# Patient Record
Sex: Female | Born: 1960
Health system: Southern US, Community
[De-identification: ages and names within clinical notes are randomized; demographics above are authoritative.]

## PROBLEM LIST (undated history)

## (undated) DIAGNOSIS — D649 Anemia, unspecified: Secondary | ICD-10-CM

## (undated) DIAGNOSIS — F419 Anxiety disorder, unspecified: Secondary | ICD-10-CM

## (undated) DIAGNOSIS — F4310 Post-traumatic stress disorder, unspecified: Secondary | ICD-10-CM

## (undated) DIAGNOSIS — Z86010 Personal history of colonic polyps: Secondary | ICD-10-CM

## (undated) DIAGNOSIS — K589 Irritable bowel syndrome without diarrhea: Secondary | ICD-10-CM

## (undated) DIAGNOSIS — N289 Disorder of kidney and ureter, unspecified: Secondary | ICD-10-CM

## (undated) DIAGNOSIS — G2581 Restless legs syndrome: Secondary | ICD-10-CM

## (undated) DIAGNOSIS — E039 Hypothyroidism, unspecified: Secondary | ICD-10-CM

## (undated) DIAGNOSIS — B009 Herpesviral infection, unspecified: Secondary | ICD-10-CM

## (undated) DIAGNOSIS — N200 Calculus of kidney: Secondary | ICD-10-CM

## (undated) DIAGNOSIS — Z860101 Personal history of adenomatous and serrated colon polyps: Secondary | ICD-10-CM

## (undated) DIAGNOSIS — M8580 Other specified disorders of bone density and structure, unspecified site: Secondary | ICD-10-CM

## (undated) DIAGNOSIS — K5900 Constipation, unspecified: Secondary | ICD-10-CM

## (undated) DIAGNOSIS — F4312 Post-traumatic stress disorder, chronic: Secondary | ICD-10-CM

## (undated) DIAGNOSIS — F32A Depression, unspecified: Secondary | ICD-10-CM

## (undated) DIAGNOSIS — K635 Polyp of colon: Secondary | ICD-10-CM

## (undated) DIAGNOSIS — J45909 Unspecified asthma, uncomplicated: Secondary | ICD-10-CM

## (undated) DIAGNOSIS — E079 Disorder of thyroid, unspecified: Secondary | ICD-10-CM

## (undated) DIAGNOSIS — T7840XA Allergy, unspecified, initial encounter: Secondary | ICD-10-CM

## (undated) DIAGNOSIS — F329 Major depressive disorder, single episode, unspecified: Secondary | ICD-10-CM

## (undated) DIAGNOSIS — K219 Gastro-esophageal reflux disease without esophagitis: Secondary | ICD-10-CM

## (undated) DIAGNOSIS — E215 Disorder of parathyroid gland, unspecified: Secondary | ICD-10-CM

## (undated) DIAGNOSIS — F319 Bipolar disorder, unspecified: Secondary | ICD-10-CM

## (undated) DIAGNOSIS — E785 Hyperlipidemia, unspecified: Secondary | ICD-10-CM

## (undated) DIAGNOSIS — N809 Endometriosis, unspecified: Secondary | ICD-10-CM

## (undated) DIAGNOSIS — K759 Inflammatory liver disease, unspecified: Secondary | ICD-10-CM

## (undated) DIAGNOSIS — K649 Unspecified hemorrhoids: Secondary | ICD-10-CM

## (undated) HISTORY — DX: Disorder of parathyroid gland, unspecified: E21.5

## (undated) HISTORY — DX: Personal history of adenomatous and serrated colon polyps: Z86.0101

## (undated) HISTORY — DX: Inflammatory liver disease, unspecified: K75.9

## (undated) HISTORY — PX: POLYPECTOMY: SHX149

## (undated) HISTORY — DX: Personal history of colonic polyps: Z86.010

## (undated) HISTORY — DX: Herpesviral infection, unspecified: B00.9

## (undated) HISTORY — DX: Post-traumatic stress disorder, unspecified: F43.10

## (undated) HISTORY — DX: Constipation, unspecified: K59.00

## (undated) HISTORY — PX: APPENDECTOMY: SHX54

## (undated) HISTORY — DX: Post-traumatic stress disorder, chronic: F43.12

## (undated) HISTORY — PX: ECTOPIC PREGNANCY SURGERY: SHX613

## (undated) HISTORY — DX: Anxiety disorder, unspecified: F41.9

## (undated) HISTORY — DX: Other specified disorders of bone density and structure, unspecified site: M85.80

## (undated) HISTORY — DX: Unspecified asthma, uncomplicated: J45.909

## (undated) HISTORY — DX: Restless legs syndrome: G25.81

## (undated) HISTORY — DX: Gastro-esophageal reflux disease without esophagitis: K21.9

## (undated) HISTORY — DX: Disorder of kidney and ureter, unspecified: N28.9

## (undated) HISTORY — DX: Disorder of thyroid, unspecified: E07.9

## (undated) HISTORY — DX: Anemia, unspecified: D64.9

## (undated) HISTORY — DX: Hyperlipidemia, unspecified: E78.5

## (undated) HISTORY — DX: Unspecified hemorrhoids: K64.9

## (undated) HISTORY — DX: Calculus of kidney: N20.0

## (undated) HISTORY — DX: Endometriosis, unspecified: N80.9

## (undated) HISTORY — DX: Allergy, unspecified, initial encounter: T78.40XA

## (undated) HISTORY — DX: Polyp of colon: K63.5

## (undated) HISTORY — DX: Irritable bowel syndrome, unspecified: K58.9

---

## 1978-12-27 HISTORY — PX: OTHER SURGICAL HISTORY: SHX169

## 1998-10-07 ENCOUNTER — Other Ambulatory Visit: Admission: RE | Admit: 1998-10-07 | Discharge: 1998-10-07 | Payer: Self-pay | Admitting: Obstetrics and Gynecology

## 1999-11-25 ENCOUNTER — Other Ambulatory Visit: Admission: RE | Admit: 1999-11-25 | Discharge: 1999-11-25 | Payer: Self-pay | Admitting: Obstetrics and Gynecology

## 2000-12-01 ENCOUNTER — Other Ambulatory Visit: Admission: RE | Admit: 2000-12-01 | Discharge: 2000-12-01 | Payer: Self-pay | Admitting: Obstetrics and Gynecology

## 2001-12-25 ENCOUNTER — Encounter: Admission: RE | Admit: 2001-12-25 | Discharge: 2001-12-25 | Payer: Self-pay | Admitting: *Deleted

## 2001-12-25 ENCOUNTER — Encounter: Payer: Self-pay | Admitting: *Deleted

## 2002-01-12 ENCOUNTER — Other Ambulatory Visit: Admission: RE | Admit: 2002-01-12 | Discharge: 2002-01-12 | Payer: Self-pay | Admitting: Obstetrics and Gynecology

## 2002-02-08 ENCOUNTER — Encounter: Payer: Self-pay | Admitting: Internal Medicine

## 2002-02-08 ENCOUNTER — Ambulatory Visit (HOSPITAL_COMMUNITY): Admission: RE | Admit: 2002-02-08 | Discharge: 2002-02-08 | Payer: Self-pay | Admitting: Internal Medicine

## 2004-12-22 ENCOUNTER — Ambulatory Visit: Payer: Self-pay

## 2004-12-22 ENCOUNTER — Ambulatory Visit: Payer: Self-pay | Admitting: Internal Medicine

## 2006-01-06 ENCOUNTER — Other Ambulatory Visit: Admission: RE | Admit: 2006-01-06 | Discharge: 2006-01-06 | Payer: Self-pay | Admitting: Obstetrics and Gynecology

## 2006-02-18 ENCOUNTER — Ambulatory Visit: Payer: Self-pay | Admitting: Internal Medicine

## 2007-01-10 ENCOUNTER — Other Ambulatory Visit: Admission: RE | Admit: 2007-01-10 | Discharge: 2007-01-10 | Payer: Self-pay | Admitting: Obstetrics and Gynecology

## 2007-08-18 ENCOUNTER — Inpatient Hospital Stay (HOSPITAL_COMMUNITY): Admission: RE | Admit: 2007-08-18 | Discharge: 2007-08-21 | Payer: Self-pay | Admitting: Psychiatry

## 2007-08-18 ENCOUNTER — Ambulatory Visit: Payer: Self-pay | Admitting: *Deleted

## 2007-11-29 ENCOUNTER — Ambulatory Visit: Payer: Self-pay | Admitting: Internal Medicine

## 2008-01-12 ENCOUNTER — Other Ambulatory Visit: Admission: RE | Admit: 2008-01-12 | Discharge: 2008-01-12 | Payer: Self-pay | Admitting: Obstetrics and Gynecology

## 2008-01-13 DIAGNOSIS — K625 Hemorrhage of anus and rectum: Secondary | ICD-10-CM | POA: Insufficient documentation

## 2008-01-13 DIAGNOSIS — K648 Other hemorrhoids: Secondary | ICD-10-CM | POA: Insufficient documentation

## 2008-01-13 DIAGNOSIS — F319 Bipolar disorder, unspecified: Secondary | ICD-10-CM | POA: Insufficient documentation

## 2008-01-13 DIAGNOSIS — K5900 Constipation, unspecified: Secondary | ICD-10-CM | POA: Insufficient documentation

## 2008-01-13 DIAGNOSIS — K589 Irritable bowel syndrome without diarrhea: Secondary | ICD-10-CM | POA: Insufficient documentation

## 2009-02-24 ENCOUNTER — Encounter: Payer: Self-pay | Admitting: Obstetrics and Gynecology

## 2009-02-24 ENCOUNTER — Other Ambulatory Visit: Admission: RE | Admit: 2009-02-24 | Discharge: 2009-02-24 | Payer: Self-pay | Admitting: Obstetrics and Gynecology

## 2009-02-24 ENCOUNTER — Ambulatory Visit: Payer: Self-pay | Admitting: Obstetrics and Gynecology

## 2009-05-08 ENCOUNTER — Encounter: Admission: RE | Admit: 2009-05-08 | Discharge: 2009-05-08 | Payer: Self-pay | Admitting: Emergency Medicine

## 2010-03-12 ENCOUNTER — Other Ambulatory Visit: Admission: RE | Admit: 2010-03-12 | Discharge: 2010-03-12 | Payer: Self-pay | Admitting: Obstetrics and Gynecology

## 2010-03-12 ENCOUNTER — Ambulatory Visit: Payer: Self-pay | Admitting: Obstetrics and Gynecology

## 2010-05-04 ENCOUNTER — Ambulatory Visit: Payer: Self-pay | Admitting: Obstetrics and Gynecology

## 2010-05-21 ENCOUNTER — Ambulatory Visit: Payer: Self-pay | Admitting: Obstetrics and Gynecology

## 2010-07-29 ENCOUNTER — Encounter: Admission: RE | Admit: 2010-07-29 | Discharge: 2010-07-29 | Payer: Self-pay | Admitting: Emergency Medicine

## 2010-09-02 ENCOUNTER — Emergency Department (HOSPITAL_COMMUNITY): Admission: EM | Admit: 2010-09-02 | Discharge: 2010-09-02 | Payer: Self-pay | Admitting: Emergency Medicine

## 2011-01-18 ENCOUNTER — Encounter: Payer: Self-pay | Admitting: Emergency Medicine

## 2011-01-28 NOTE — Procedures (Signed)
Summary: Gastroenterology colon  Gastroenterology colon   Imported By: Lorre Munroe RN 01/15/2008 14:15:32  _____________________________________________________________________  External Attachment:    Type:   Image     Comment:   External Document

## 2011-03-11 LAB — URINALYSIS, ROUTINE W REFLEX MICROSCOPIC
Bilirubin Urine: NEGATIVE
Glucose, UA: NEGATIVE mg/dL
Ketones, ur: NEGATIVE mg/dL
Nitrite: NEGATIVE
Protein, ur: NEGATIVE mg/dL
Specific Gravity, Urine: 1.004 — ABNORMAL LOW (ref 1.005–1.030)
Urobilinogen, UA: 0.2 mg/dL (ref 0.0–1.0)
pH: 6.5 (ref 5.0–8.0)

## 2011-03-11 LAB — URINE MICROSCOPIC-ADD ON
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
## 2011-05-14 NOTE — Op Note (Signed)
[facility]  Patient:    Anne Little, Anne Little Visit Number: [id number] MRN: [id number]          Service Type: END Location: ENDO Attending Physician:  Mervin Hack Dictated by:   Hedwig Morton. Juanda Chance, M.D. [facility] Proc. Date: 02/08/02 Admit Date:  02/08/2002   CC:         Vanita Panda, M.D.,  [facility]   Operative Report  PROCEDURE:  Colonoscopy.  INDICATIONS FOR PROCEDURE:  This 50 year old white female with history of endometriosis has had recent rectal bleeding. It has been bright red on toilet tissue as well as in the commode, while her stools themselves have been formed and normal appearing. Her bowel habits have been regular. At the age of 55, the patient had an anal fissure which was treated. Her weight has been stable. She has been on Elavil 25 mg at bedtime. There is a remote family history of colon cancer in the maternal grand aunt. She is undergoing colonoscopy to assess her rectal bleeding.  ENDOSCOPE:  Olympus single channel videoscope.  SEDATION:  Versed 15 mg IV, Demerol 150 mg IV.  FINDINGS:  The Olympus single channel videoscope passed under direct vision to the rectosigmoid colon. The patient was monitored by pulse oximeter. Her oxygen saturations were normal. Her prep was excellent. She required a large amount of sedation. The perineum appeared normal. The patient has a history of anal herpes and complained of rectal irritation, but the perineal area itself appeared normal and rectal tone was normal as well. On retroflexion with the endoscope in the rectum, there were small internal hemorrhoids which did not appear to be thrombosed and did not bleed actively. The sigmoid colon was normal with some tortuosity but no diverticulosis. There were no bleeding lesions. It was difficult to travel the descending colon because of the redundancy and the patients small size. We had to change the scope to adjustable  scope to be able to negotiate through the descending colon and as a result of the pressure, there was ______ tremor to the sigmoid and descending colon in the mucosa which appeared as mucosal hemorrhages. The transverse colon, hepatic flexure, ascending colon and the cecum was entirely normal at the ileocecal valve and appendiceal opening. Video photographs were obtained, colonoscope was then retracted, the colon decompressed. The patient tolerated the procedure well.  IMPRESSION:  1. Essentially normal but somewhat redundant colon. Nothing to account for     rectal bleeding.  2. Small insignificant internal hemorrhoids. No evidence for endometriosis.  PLAN:  The rectal bleeding most likely has been caused by anal source since it has been intermittent and bright red though no other lesions in the colon that would explain the bleeding. She was having periods today during the colonoscopy and I assume that if there was active endometriosis, this would be the time to see it. She was given Anusol-HC suppositories to use twice a day as well as Analpram-HC cream which she will use p.r.n. externally for irritation. Repeat colonoscopy in 10 years. Dictated by:   Hedwig Morton. Juanda Chance, M.D. [facility] Attending Physician:  Mervin Hack DD:  02/08/02 TD:  02/08/02 Job: [id number]

## 2011-08-18 ENCOUNTER — Other Ambulatory Visit: Payer: Self-pay | Admitting: Obstetrics and Gynecology

## 2011-08-29 ENCOUNTER — Other Ambulatory Visit: Payer: Self-pay | Admitting: Obstetrics and Gynecology

## 2011-09-01 NOTE — Telephone Encounter (Signed)
CALLED PHARMACY SINCE RX DID NOT GO THRU ON E-SCRIBE 08-31-11. DENIED. MUST SET UP VERY OVERDUE AEX TO GET REFILLS.

## 2011-10-08 LAB — URINALYSIS, ROUTINE W REFLEX MICROSCOPIC
Bilirubin Urine: NEGATIVE
Glucose, UA: NEGATIVE
Hgb urine dipstick: NEGATIVE
Ketones, ur: NEGATIVE
Nitrite: NEGATIVE
Protein, ur: NEGATIVE
Specific Gravity, Urine: 1.023
Urobilinogen, UA: 0.2
pH: 6

## 2011-10-08 LAB — DRUGS OF ABUSE SCREEN W/O ALC, ROUTINE URINE
Amphetamine Screen, Ur: NEGATIVE
Barbiturate Quant, Ur: NEGATIVE
Benzodiazepines.: NEGATIVE
Cocaine Metabolites: NEGATIVE
Creatinine,U: 173.7
Marijuana Metabolite: NEGATIVE
Methadone: NEGATIVE
Opiate Screen, Urine: NEGATIVE
Phencyclidine (PCP): NEGATIVE
Propoxyphene: NEGATIVE

## 2011-10-08 LAB — CBC
HCT: 39.1
Hemoglobin: 13.5
MCHC: 34.6
MCV: 90.5
Platelets: 246
RBC: 4.32
RDW: 12.2
WBC: 5

## 2011-10-08 LAB — TSH: TSH: 1.365

## 2011-10-08 LAB — COMPREHENSIVE METABOLIC PANEL
ALT: 24
AST: 21
Albumin: 3.6
Alkaline Phosphatase: 50
BUN: 13
CO2: 27
Calcium: 8.9
Chloride: 104
Creatinine, Ser: 0.91
GFR calc Af Amer: >60
GFR calc non Af Amer: >60
Glucose, Bld: 116 — ABNORMAL HIGH
Potassium: 3.7
Sodium: 137
Total Bilirubin: 0.8
Total Protein: 6.9

## 2011-10-08 LAB — GLUCOSE, RANDOM: Glucose, Bld: 93

## 2011-10-08 LAB — PREGNANCY, URINE: Preg Test, Ur: NEGATIVE

## 2011-12-28 HISTORY — PX: COLONOSCOPY: SHX174

## 2012-02-06 ENCOUNTER — Emergency Department (HOSPITAL_COMMUNITY)
Admission: EM | Admit: 2012-02-06 | Discharge: 2012-02-06 | Disposition: A | Discharge status: Home / Self Care | Payer: Medicaid Other | Attending: Emergency Medicine | Admitting: Emergency Medicine

## 2012-02-06 ENCOUNTER — Encounter (HOSPITAL_COMMUNITY): Payer: Self-pay | Admitting: *Deleted

## 2012-02-06 DIAGNOSIS — F319 Bipolar disorder, unspecified: Secondary | ICD-10-CM

## 2012-02-06 DIAGNOSIS — Z79899 Other long term (current) drug therapy: Secondary | ICD-10-CM | POA: Insufficient documentation

## 2012-02-06 DIAGNOSIS — F411 Generalized anxiety disorder: Secondary | ICD-10-CM | POA: Insufficient documentation

## 2012-02-06 DIAGNOSIS — F3130 Bipolar disorder, current episode depressed, mild or moderate severity, unspecified: Secondary | ICD-10-CM | POA: Insufficient documentation

## 2012-02-06 DIAGNOSIS — F419 Anxiety disorder, unspecified: Secondary | ICD-10-CM

## 2012-02-06 HISTORY — DX: Major depressive disorder, single episode, unspecified: F32.9

## 2012-02-06 HISTORY — DX: Depression, unspecified: F32.A

## 2012-02-06 LAB — COMPREHENSIVE METABOLIC PANEL
ALT: 14 U/L (ref 0–35)
AST: 15 U/L (ref 0–37)
Albumin: 3.3 g/dL — ABNORMAL LOW (ref 3.5–5.2)
Alkaline Phosphatase: 47 U/L (ref 39–117)
BUN: 10 mg/dL (ref 6–23)
CO2: 27 mEq/L (ref 19–32)
Calcium: 9.3 mg/dL (ref 8.4–10.5)
Chloride: 107 mEq/L (ref 96–112)
Creatinine, Ser: 0.84 mg/dL (ref 0.50–1.10)
GFR calc Af Amer: >90 mL/min (ref >90)
GFR calc non Af Amer: 79 mL/min — ABNORMAL LOW (ref >90)
Glucose, Bld: 73 mg/dL (ref 70–99)
Potassium: 4.2 mEq/L (ref 3.5–5.1)
Sodium: 139 mEq/L (ref 135–145)
Total Bilirubin: 0.3 mg/dL (ref 0.3–1.2)
Total Protein: 7 g/dL (ref 6.0–8.3)

## 2012-02-06 LAB — CBC
HCT: 35.9 % — ABNORMAL LOW (ref 36.0–46.0)
Hemoglobin: 12.1 g/dL (ref 12.0–15.0)
MCH: 32.1 pg (ref 26.0–34.0)
MCHC: 33.7 g/dL (ref 30.0–36.0)
MCV: 95.2 fL (ref 78.0–100.0)
Platelets: 295 10*3/uL (ref 150–400)
RBC: 3.77 MIL/uL — ABNORMAL LOW (ref 3.87–5.11)
RDW: 12.3 % (ref 11.5–15.5)
WBC: 7 10*3/uL (ref 4.0–10.5)

## 2012-02-06 LAB — ETHANOL: Alcohol, Ethyl (B): <11 mg/dL (ref 0–11)

## 2012-02-06 LAB — POCT PREGNANCY, URINE: Preg Test, Ur: NEGATIVE

## 2012-02-06 LAB — RAPID URINE DRUG SCREEN, HOSP PERFORMED
Amphetamines: NOT DETECTED
Barbiturates: NOT DETECTED
Benzodiazepines: NOT DETECTED
Cocaine: NOT DETECTED
Opiates: NOT DETECTED
Tetrahydrocannabinol: NOT DETECTED

## 2012-02-06 LAB — ACETAMINOPHEN LEVEL: Acetaminophen (Tylenol), Serum: <15 ug/mL (ref 10–30)

## 2012-02-06 NOTE — ED Notes (Signed)
ZOX:[id number]<AV> Expected date:02/06/12<BR> Expected time: 8:24 PM<BR> Means of arrival:Ambulance<BR> Comments:<BR> [facility], 50 yof SI with etoh, ETA 10

## 2012-02-06 NOTE — ED Notes (Signed)
Pt comes from home via [facility] where she called them herself to report that she was considering suicide due tot being severely depressed. Pt started hitting her head on her wall at home then took 2 tablespoons of whiskey then 4 klonopin.  Pt also took her prescribed amount of amitriptyline and lithium.

## 2012-02-06 NOTE — ED Provider Notes (Signed)
History     CSN: [id number]  Arrival date & time 02/06/12  2027   First MD Initiated Contact with Patient 02/06/12 2034      Chief Complaint  Patient presents with  . Suicidal  . Medical Clearance     HPI Patient's presents with anxiety at home.  Took her medication and was still very anxious so she drank a couple of tablespoons of alcohol and then she got very scared and called 911.  Patient states she's never been suicidal and was not wanting to harm herself she just felt scared and alone.  Patient has long history of bipolar disorder.  She denies homicidal suicidal thoughts or ideations at the present time when I examined the patient she was with her sister who is very viable and states she wanted to take her home.  A agreed to see her psychiatrist tomorrow.  At the present time I feel this is reasonable and acceptable request. Past Medical History  Diagnosis Date  . Depression     History reviewed. No pertinent past surgical history.  History reviewed. No pertinent family history.  History  Substance Use Topics  . Smoking status: Not on file  . Smokeless tobacco: Not on file  . Alcohol Use: Yes    OB History    Grav Para Term Preterm Abortions TAB SAB Ect Mult Living                  Review of Systems Negative except as noted in history of present illness Allergies  Penicillins and Lamictal  Home Medications   Current Outpatient Rx  Name Route Sig Dispense Refill  . AMITRIPTYLINE HCL 10 MG PO TABS Oral Take 20 mg by mouth at bedtime.    . BUPROPION HCL ER (SR) 150 MG PO TB12 Oral Take 150 mg by mouth daily.    Marland Kitchen CLONAZEPAM 1 MG PO TABS Oral Take 1-3 mg by mouth See admin instructions. TAKES 1 TABLET EVERY MORNING AND TAKES 2 TABLETS EVERY EVENING    . ESOMEPRAZOLE MAGNESIUM 40 MG PO CPDR Oral Take 40 mg by mouth 2 (two) times daily.    Marland Kitchen ETHYNODIOL DIAC-ETH ESTRADIOL 1-35 MG-MCG PO TABS Oral Take 1 tablet by mouth daily.    Marland Kitchen LEVOTHYROXINE SODIUM 25 MCG PO  TABS Oral Take 62.5 mcg by mouth daily.    Marland Kitchen LITHIUM CARBONATE 300 MG PO CAPS Oral Take 900 mg by mouth daily.    Marland Kitchen PROPRANOLOL HCL 10 MG PO TABS Oral Take 10 mg by mouth 2 (two) times daily.      BP 98/60  Pulse 62  Temp(Src) 98.6 F (37 C) (Oral)  Resp 16  SpO2 100%  Physical Exam  Nursing note and vitals reviewed. Constitutional: She is oriented to person, place, and time. She appears well-developed and well-nourished. No distress.  HENT:  Head: Normocephalic and atraumatic.  Eyes: Pupils are equal, round, and reactive to light.  Neck: Normal range of motion.  Cardiovascular: Normal rate and intact distal pulses.   Pulmonary/Chest: No respiratory distress.  Abdominal: Normal appearance. She exhibits no distension.  Musculoskeletal: Normal range of motion.  Neurological: She is alert and oriented to person, place, and time. No cranial nerve deficit.  Skin: Skin is warm and dry. No rash noted.  Psychiatric: She has a normal mood and affect. Her behavior is normal. Judgment normal. Thought content is not paranoid and not delusional. Cognition and memory are normal. She expresses no homicidal and no suicidal ideation.  She expresses no suicidal plans and no homicidal plans.    ED Course  Procedures (including critical care time)  Labs Reviewed  CBC - Abnormal; Notable for the following:    RBC 3.77 (*)    HCT 35.9 (*)    All other components within normal limits  COMPREHENSIVE METABOLIC PANEL - Abnormal; Notable for the following:    Albumin 3.3 (*)    GFR calc non Af Amer 79 (*)    All other components within normal limits  ETHANOL  ACETAMINOPHEN LEVEL  URINE RAPID DRUG SCREEN (HOSP PERFORMED)  POCT PREGNANCY, URINE   No results found.   1. BIPOLAR DISORDER UNSPECIFIED   2. Anxiety       MDM          Nelia Shi, MD 02/06/12 2250

## 2012-02-18 ENCOUNTER — Encounter: Payer: Self-pay | Admitting: Internal Medicine

## 2012-09-04 ENCOUNTER — Emergency Department (HOSPITAL_COMMUNITY)
Admission: EM | Admit: 2012-09-04 | Discharge: 2012-09-04 | Disposition: A | Discharge status: Home / Self Care | Payer: Medicaid Other | Attending: Emergency Medicine | Admitting: Emergency Medicine

## 2012-09-04 ENCOUNTER — Encounter (HOSPITAL_COMMUNITY): Payer: Self-pay | Admitting: Family Medicine

## 2012-09-04 DIAGNOSIS — R238 Other skin changes: Secondary | ICD-10-CM | POA: Insufficient documentation

## 2012-09-04 DIAGNOSIS — Z888 Allergy status to other drugs, medicaments and biological substances status: Secondary | ICD-10-CM | POA: Insufficient documentation

## 2012-09-04 DIAGNOSIS — F319 Bipolar disorder, unspecified: Secondary | ICD-10-CM | POA: Insufficient documentation

## 2012-09-04 DIAGNOSIS — Z880 Allergy status to penicillin: Secondary | ICD-10-CM | POA: Insufficient documentation

## 2012-09-04 DIAGNOSIS — L853 Xerosis cutis: Secondary | ICD-10-CM

## 2012-09-04 HISTORY — DX: Bipolar disorder, unspecified: F31.9

## 2012-09-04 NOTE — ED Notes (Signed)
Pt complaining of rash all over for months. sts boils that have resolved

## 2012-09-04 NOTE — ED Provider Notes (Signed)
History    This chart was scribed for Loren Racer, MD, MD by Smitty Pluck. The patient was seen in room TR11C and the patient's care was started at 12:24PM.   CSN: [id number]  Arrival date & time 09/04/12  1147   First MD Initiated Contact with Patient 09/04/12 1224    Chief Complaint  Patient presents with  . Rash    (Consider location/radiation/quality/duration/timing/severity/associated sxs/prior treatment) Patient is a 51 y.o. female presenting with rash. The history is provided by the patient. No language interpreter was used.  Rash    Anne Little is a 51 y.o. female who presents to the Emergency Department complaining of moderate facial skin dryness with unknown onset over past weeks. Pt reports she has seen dermatologist in past for similar symptoms. She reports that she gets boils but they have resolved. She reports that her medication for bipolar disorder has recently changed and thinks this might be the cause. Denies any other pain currently.   Past Medical History  Diagnosis Date  . Depression   . Bipolar 1 disorder     History reviewed. No pertinent past surgical history.  History reviewed. No pertinent family history.  History  Substance Use Topics  . Smoking status: Not on file  . Smokeless tobacco: Not on file  . Alcohol Use: Yes    OB History    Grav Para Term Preterm Abortions TAB SAB Ect Mult Living                  Review of Systems  Constitutional: Negative for fever and chills.  Respiratory: Negative for shortness of breath.   Gastrointestinal: Negative for nausea and vomiting.  Skin: Negative for rash.  Neurological: Negative for weakness.    Allergies  Penicillins and Lamictal  Home Medications   Current Outpatient Rx  Name Route Sig Dispense Refill  . AMITRIPTYLINE HCL 10 MG PO TABS Oral Take 20 mg by mouth at bedtime.    . BUPROPION HCL 100 MG PO TABS Oral Take 100 mg by mouth 2 (two) times daily.    Marland Kitchen CLONAZEPAM 1 MG PO TABS  Oral Take 0.5 mg by mouth at bedtime. TAKES 1 TABLET EVERY MORNING AND TAKES 2 TABLETS EVERY EVENING    . ETHYNODIOL DIAC-ETH ESTRADIOL 1-35 MG-MCG PO TABS Oral Take 1 tablet by mouth daily.    Marland Kitchen LEVOTHYROXINE SODIUM 25 MCG PO TABS Oral Take 62.5 mcg by mouth daily.    Marland Kitchen LITHIUM CARBONATE 300 MG PO CAPS Oral Take 1,200 mg by mouth daily.     Marland Kitchen PROPRANOLOL HCL 10 MG PO TABS Oral Take 20 mg by mouth 2 (two) times daily.     Marland Kitchen ZOLPIDEM TARTRATE 5 MG PO TABS Oral Take 5 mg by mouth at bedtime as needed. For sleep      BP 113/73  Pulse 70  Temp 98.5 F (36.9 C) (Oral)  Resp 20  SpO2 100%  Physical Exam  Nursing note and vitals reviewed. Constitutional: She is oriented to person, place, and time. She appears well-developed and well-nourished. No distress.  HENT:  Head: Normocephalic and atraumatic.  Mouth/Throat: Oropharynx is clear and moist.  Eyes: Conjunctivae are normal.  Pulmonary/Chest: Effort normal. No respiratory distress.  Neurological: She is alert and oriented to person, place, and time.  Skin: No rash noted. No erythema.       Generalized dry skin of face   Psychiatric: She has a normal mood and affect. Her behavior is normal.  ED Course  Procedures (including critical care time) DIAGNOSTIC STUDIES: Oxygen Saturation is 100% on room air, normal by my interpretation.    COORDINATION OF CARE: 12:26 PM Discussed pt ED treatment.  Pt instructed to follow up with dermatologist if symptoms worsen.     Labs Reviewed - No data to display No results found.   1. Dry skin       MDM  I personally performed the services described in this documentation, which was scribed in my presence. The recorded information has been reviewed and considered.      Loren Racer, MD 09/08/12 [phone]

## 2012-10-10 ENCOUNTER — Encounter: Payer: Self-pay | Admitting: Internal Medicine

## 2013-02-20 ENCOUNTER — Emergency Department (HOSPITAL_COMMUNITY)
Admission: EM | Admit: 2013-02-20 | Discharge: 2013-02-20 | Disposition: A | Discharge status: Home / Self Care | Payer: Medicaid Other | Attending: Emergency Medicine | Admitting: Emergency Medicine

## 2013-02-20 ENCOUNTER — Encounter (HOSPITAL_COMMUNITY): Payer: Self-pay | Admitting: *Deleted

## 2013-02-20 DIAGNOSIS — Z3202 Encounter for pregnancy test, result negative: Secondary | ICD-10-CM | POA: Insufficient documentation

## 2013-02-20 DIAGNOSIS — R1013 Epigastric pain: Secondary | ICD-10-CM | POA: Insufficient documentation

## 2013-02-20 DIAGNOSIS — R109 Unspecified abdominal pain: Secondary | ICD-10-CM

## 2013-02-20 DIAGNOSIS — F329 Major depressive disorder, single episode, unspecified: Secondary | ICD-10-CM | POA: Insufficient documentation

## 2013-02-20 DIAGNOSIS — F319 Bipolar disorder, unspecified: Secondary | ICD-10-CM | POA: Insufficient documentation

## 2013-02-20 DIAGNOSIS — Z79899 Other long term (current) drug therapy: Secondary | ICD-10-CM | POA: Insufficient documentation

## 2013-02-20 DIAGNOSIS — F3289 Other specified depressive episodes: Secondary | ICD-10-CM | POA: Insufficient documentation

## 2013-02-20 DIAGNOSIS — R112 Nausea with vomiting, unspecified: Secondary | ICD-10-CM | POA: Insufficient documentation

## 2013-02-20 DIAGNOSIS — E039 Hypothyroidism, unspecified: Secondary | ICD-10-CM | POA: Insufficient documentation

## 2013-02-20 HISTORY — DX: Hypothyroidism, unspecified: E03.9

## 2013-02-20 LAB — COMPREHENSIVE METABOLIC PANEL
ALT: 42 U/L — ABNORMAL HIGH (ref 0–35)
AST: 26 U/L (ref 0–37)
Albumin: 3.5 g/dL (ref 3.5–5.2)
Alkaline Phosphatase: 71 U/L (ref 39–117)
BUN: 11 mg/dL (ref 6–23)
CO2: 29 mEq/L (ref 19–32)
Calcium: 10 mg/dL (ref 8.4–10.5)
Chloride: 100 mEq/L (ref 96–112)
Creatinine, Ser: 1 mg/dL (ref 0.50–1.10)
GFR calc Af Amer: 73 mL/min — ABNORMAL LOW (ref >90)
GFR calc non Af Amer: 63 mL/min — ABNORMAL LOW (ref >90)
Glucose, Bld: 117 mg/dL — ABNORMAL HIGH (ref 70–99)
Potassium: 4.3 mEq/L (ref 3.5–5.1)
Sodium: 137 mEq/L (ref 135–145)
Total Bilirubin: 0.4 mg/dL (ref 0.3–1.2)
Total Protein: 6.7 g/dL (ref 6.0–8.3)

## 2013-02-20 LAB — CBC WITH DIFFERENTIAL/PLATELET
Basophils Absolute: 0 10*3/uL (ref 0.0–0.1)
Basophils Relative: 1 % (ref 0–1)
Eosinophils Absolute: 0.3 10*3/uL (ref 0.0–0.7)
Eosinophils Relative: 4 % (ref 0–5)
HCT: 34 % — ABNORMAL LOW (ref 36.0–46.0)
Hemoglobin: 11.7 g/dL — ABNORMAL LOW (ref 12.0–15.0)
Lymphocytes Relative: 14 % (ref 12–46)
Lymphs Abs: 1.1 10*3/uL (ref 0.7–4.0)
MCH: 32.4 pg (ref 26.0–34.0)
MCHC: 34.4 g/dL (ref 30.0–36.0)
MCV: 94.2 fL (ref 78.0–100.0)
Monocytes Absolute: 0.6 10*3/uL (ref 0.1–1.0)
Monocytes Relative: 7 % (ref 3–12)
Neutro Abs: 5.9 10*3/uL (ref 1.7–7.7)
Neutrophils Relative %: 75 % (ref 43–77)
Platelets: 270 10*3/uL (ref 150–400)
RBC: 3.61 MIL/uL — ABNORMAL LOW (ref 3.87–5.11)
RDW: 12.2 % (ref 11.5–15.5)
WBC: 7.9 10*3/uL (ref 4.0–10.5)

## 2013-02-20 LAB — URINALYSIS, ROUTINE W REFLEX MICROSCOPIC
Bilirubin Urine: NEGATIVE
Glucose, UA: NEGATIVE mg/dL
Hgb urine dipstick: NEGATIVE
Ketones, ur: NEGATIVE mg/dL
Nitrite: NEGATIVE
Protein, ur: NEGATIVE mg/dL
Specific Gravity, Urine: 1.015 (ref 1.005–1.030)
Urobilinogen, UA: 0.2 mg/dL (ref 0.0–1.0)
pH: 8 (ref 5.0–8.0)

## 2013-02-20 LAB — LACTIC ACID, PLASMA: Lactic Acid, Venous: 1.5 mmol/L (ref 0.5–2.2)

## 2013-02-20 LAB — URINE MICROSCOPIC-ADD ON

## 2013-02-20 LAB — LIPASE, BLOOD: Lipase: 24 U/L (ref 11–59)

## 2013-02-20 LAB — PREGNANCY, URINE: Preg Test, Ur: NEGATIVE

## 2013-02-20 MED ORDER — ONDANSETRON 4 MG PO TBDP
8.0000 mg | ORAL_TABLET | Freq: Once | ORAL | Status: AC
  Administered 2013-02-20: 8 mg via ORAL
  Filled 2013-02-20: qty 2

## 2013-02-20 MED ORDER — OXYCODONE-ACETAMINOPHEN 5-325 MG PO TABS
1.0000 | ORAL_TABLET | Freq: Once | ORAL | Status: AC
  Administered 2013-02-20: 1 via ORAL
  Filled 2013-02-20: qty 1

## 2013-02-20 NOTE — ED Notes (Signed)
Pt. Reports intermittent upper abdominal "cramping" with nausea, no vomiting. LBM this AM. States "I'm almost done with menopause". LMP 6 weeks ago.

## 2013-02-20 NOTE — ED Notes (Signed)
Pt c/o abdominal cramping since 10 am this morning, some nausea, no vomiting or diarrhea.

## 2013-02-20 NOTE — ED Provider Notes (Signed)
History     CSN: [id number]  Arrival date & time 02/20/13  0023   First MD Initiated Contact with Patient 02/20/13 0123      Chief Complaint  Patient presents with  . Abdominal Pain     Patient is a 52 y.o. female presenting with abdominal pain. The history is provided by the patient.  Abdominal Pain Pain location:  Epigastric Pain quality: aching, bloating and cramping   Pain radiates to:  Does not radiate Pain severity:  Moderate Onset quality:  Gradual Timing:  Intermittent Progression:  Improving Chronicity:  New Relieved by:  Nothing Associated symptoms: nausea and vomiting   Associated symptoms: no chest pain, no constipation, no diarrhea, no dysuria, no fever, no hematemesis, no hematochezia, no melena, no shortness of breath, no vaginal bleeding and no vaginal discharge     Past Medical History  Diagnosis Date  . Depression   . Bipolar 1 disorder   . Hypothyroidism   surgical history - h/o appendectomy and surgery for endometriosis  History reviewed. No pertinent past surgical history.  History reviewed. No pertinent family history.  History  Substance Use Topics  . Smoking status: Never Smoker   . Smokeless tobacco: Not on file  . Alcohol Use: Yes     Comment: occ    OB History   Grav Para Term Preterm Abortions TAB SAB Ect Mult Living                  Review of Systems  Constitutional: Negative for fever.  Respiratory: Negative for shortness of breath.   Cardiovascular: Negative for chest pain.  Gastrointestinal: Positive for nausea, vomiting and abdominal pain. Negative for diarrhea, constipation, melena, hematochezia and hematemesis.  Genitourinary: Negative for dysuria, vaginal bleeding and vaginal discharge.  All other systems reviewed and are negative.    Allergies  Neomycin; Penicillins; and Lamictal  Home Medications   Current Outpatient Rx  Name  Route  Sig  Dispense  Refill  . buPROPion (WELLBUTRIN XL) 300 MG 24 hr tablet  Oral   Take 300 mg by mouth daily.         . clonazePAM (KLONOPIN) 0.5 MG tablet   Oral   Take 0.5-2 mg by mouth 4 (four) times daily as needed for anxiety (Patient takes at bedtime, but can take throughout the day as needed).         . Estrogens Conjugated (PREMARIN PO)   Oral   Take 1 tablet by mouth daily.         Marland Kitchen levothyroxine (SYNTHROID, LEVOTHROID) 25 MCG tablet   Oral   Take 62.5 mcg by mouth daily.         Marland Kitchen lithium carbonate 300 MG capsule   Oral   Take 1,200 mg by mouth daily.          . ondansetron (ZOFRAN-ODT) 8 MG disintegrating tablet   Oral   Take 8 mg by mouth every 8 (eight) hours as needed for nausea.         . propranolol (INDERAL) 10 MG tablet   Oral   Take 20 mg by mouth 2 (two) times daily.          Marland Kitchen zolpidem (AMBIEN) 5 MG tablet   Oral   Take 5 mg by mouth at bedtime. For sleep           BP 140/64  Pulse 70  Temp(Src) 97.6 F (36.4 C) (Oral)  Resp 18  SpO2 100% BP 115/74  Pulse 60  Temp(Src) 98.3 F (36.8 C) (Oral)  Resp 18  SpO2 100%   Physical Exam CONSTITUTIONAL: Well developed/well nourished HEAD: Normocephalic/atraumatic EYES: EOMI/PERRL, no icterus ENMT: Mucous membranes moist NECK: supple no meningeal signs SPINE:entire spine nontender CV: S1/S2 noted, no murmurs/rubs/gallops noted LUNGS: Lungs are clear to auscultation bilaterally, no apparent distress ABDOMEN: soft, nontender, no rebound or guarding GU:no cva tenderness NEURO: Pt is awake/alert, moves all extremitiesx4 EXTREMITIES: pulses normal, full ROM SKIN: warm, color normal PSYCH: no abnormalities of mood noted  ED Course  Procedures   Labs Reviewed  URINALYSIS, ROUTINE W REFLEX MICROSCOPIC - Abnormal; Notable for the following:    APPearance CLOUDY (*)    Leukocytes, UA SMALL (*)    All other components within normal limits  URINE MICROSCOPIC-ADD ON - Abnormal; Notable for the following:    Squamous Epithelial / LPF MANY (*)    Bacteria,  UA MANY (*)    All other components within normal limits  COMPREHENSIVE METABOLIC PANEL - Abnormal; Notable for the following:    Glucose, Bld 117 (*)    ALT 42 (*)    GFR calc non Af Amer 63 (*)    GFR calc Af Amer 73 (*)    All other components within normal limits  CBC WITH DIFFERENTIAL - Abnormal; Notable for the following:    RBC 3.61 (*)    Hemoglobin 11.7 (*)    HCT 34.0 (*)    All other components within normal limits  URINE CULTURE  PREGNANCY, URINE  LIPASE, BLOOD  LACTIC ACID, PLASMA   3:10 AM Pt well appearing, reports her pain is improved Reports h/o IBS, but this is different Reports normal BM earlier  She does report vomiting on my evaluation (she denied vomiting to nurse)  4:43 AM Pt resting comfortably He abdominal exam is completely benign She feels that there is "gas trapped" in her abdomen causing symptoms She has no lower abdominal tenderness and no GU complaints, doubt acute gynecologic emergency She feels safe for discharge.  We discussed strict return precautions  MDM  Nursing notes including past medical history and social history reviewed and considered in documentation Labs/vital reviewed and considered         Joya Gaskins, MD 02/20/13 [phone]

## 2013-02-21 LAB — URINE CULTURE
Colony Count: NO GROWTH
Culture: NO GROWTH

## 2013-08-21 ENCOUNTER — Encounter (HOSPITAL_COMMUNITY): Payer: Self-pay | Admitting: Emergency Medicine

## 2013-08-21 ENCOUNTER — Emergency Department (HOSPITAL_COMMUNITY)
Admission: EM | Admit: 2013-08-21 | Discharge: 2013-08-21 | Disposition: A | Discharge status: Home / Self Care | Payer: Medicaid Other | Attending: Emergency Medicine | Admitting: Emergency Medicine

## 2013-08-21 DIAGNOSIS — E039 Hypothyroidism, unspecified: Secondary | ICD-10-CM | POA: Insufficient documentation

## 2013-08-21 DIAGNOSIS — Z880 Allergy status to penicillin: Secondary | ICD-10-CM | POA: Insufficient documentation

## 2013-08-21 DIAGNOSIS — Z79899 Other long term (current) drug therapy: Secondary | ICD-10-CM | POA: Insufficient documentation

## 2013-08-21 DIAGNOSIS — H579 Unspecified disorder of eye and adnexa: Secondary | ICD-10-CM | POA: Insufficient documentation

## 2013-08-21 DIAGNOSIS — H5789 Other specified disorders of eye and adnexa: Secondary | ICD-10-CM | POA: Insufficient documentation

## 2013-08-21 DIAGNOSIS — H00016 Hordeolum externum left eye, unspecified eyelid: Secondary | ICD-10-CM

## 2013-08-21 DIAGNOSIS — F319 Bipolar disorder, unspecified: Secondary | ICD-10-CM | POA: Insufficient documentation

## 2013-08-21 DIAGNOSIS — H00019 Hordeolum externum unspecified eye, unspecified eyelid: Secondary | ICD-10-CM | POA: Insufficient documentation

## 2013-08-21 MED ORDER — TETRACAINE HCL 0.5 % OP SOLN
2.0000 [drp] | Freq: Once | OPHTHALMIC | Status: AC
  Administered 2013-08-21: 2 [drp] via OPHTHALMIC
  Filled 2013-08-21: qty 4

## 2013-08-21 MED ORDER — FLUORESCEIN SODIUM 1 MG OP STRP
1.0000 | ORAL_STRIP | Freq: Once | OPHTHALMIC | Status: AC
  Administered 2013-08-21: 13:00:00 via OPHTHALMIC
  Filled 2013-08-21: qty 1

## 2013-08-21 MED ORDER — POLYMYXIN B-TRIMETHOPRIM 10000-0.1 UNIT/ML-% OP SOLN: 1.0000 [drp] | OPHTHALMIC | Status: DC

## 2013-08-21 NOTE — ED Provider Notes (Signed)
CSN: [id number]     Arrival date & time 08/21/13  1130 History   First MD Initiated Contact with Patient 08/21/13 1141     Chief Complaint  Patient presents with  . Eye Pain   (Consider location/radiation/quality/duration/timing/severity/associated sxs/prior Treatment) HPI Comments: Patient reports that 3-4 days ago she scratched her eye with her fingernail.  She reports that her eye has been feeling irritated since that time.  She reports that yesterday her upper eye lid was swollen and she noticed a small amount of discharge from her eye.  She reports that the swelling has improved at this time.  She states that she has seasonal allergies and her eyes have been itchy and watery for the past week.  She denies any changes in vision.  Denies photophobia.  She reports that her eye is not painful, but that it feels irritated and feels as if there is something in it.  She wears contact lenses and states that she took her left contact out this morning.    The history is provided by the patient.    Past Medical History  Diagnosis Date  . Depression   . Bipolar 1 disorder   . Hypothyroidism    History reviewed. No pertinent past surgical history. History reviewed. No pertinent family history. History  Substance Use Topics  . Smoking status: Never Smoker   . Smokeless tobacco: Not on file  . Alcohol Use: Yes     Comment: occ   OB History   Grav Para Term Preterm Abortions TAB SAB Ect Mult Living                 Review of Systems  Eyes: Positive for discharge, redness and itching.  All other systems reviewed and are negative.    Allergies  Neomycin; Penicillins; and Lamictal  Home Medications   Current Outpatient Rx  Name  Route  Sig  Dispense  Refill  . buPROPion (WELLBUTRIN SR) 150 MG 12 hr tablet   Oral   Take 150 mg by mouth 2 (two) times daily.         . clonazePAM (KLONOPIN) 0.5 MG tablet   Oral   Take 0.5 mg by mouth 2 (two) times daily as needed for anxiety.           . Estrogens Conjugated (PREMARIN PO)   Oral   Take 1 tablet by mouth daily.         Marland Kitchen levothyroxine (SYNTHROID, LEVOTHROID) 25 MCG tablet   Oral   Take 62.5 mcg by mouth daily.         Marland Kitchen lithium carbonate 300 MG capsule   Oral   Take 300-600 mg by mouth daily. 300 in am and 600 in pm.         . propranolol (INDERAL) 10 MG tablet   Oral   Take 10-20 mg by mouth 2 (two) times daily. 10 mg in am and 20 mg in pm.          BP 118/69  Pulse 63  Temp(Src) 98.3 F (36.8 C) (Oral)  Resp 16  SpO2 100% Physical Exam  Nursing note and vitals reviewed. Constitutional: She appears well-developed and well-nourished.  HENT:  Head: Normocephalic and atraumatic.  Eyes: Conjunctivae, EOM and lids are normal. Pupils are equal, round, and reactive to light. Lids are everted and swept, no foreign bodies found. No foreign body present in the left eye.  Slit lamp exam:      The left eye  shows no corneal abrasion, no corneal ulcer and no foreign body.  Neck: Normal range of motion. Neck supple.  Cardiovascular: Normal rate, regular rhythm and normal heart sounds.   Pulmonary/Chest: Effort normal and breath sounds normal.  Neurological: She is alert.  Skin: Skin is warm and dry.  Psychiatric: She has a normal mood and affect.    ED Course  Procedures (including critical care time) Labs Review Labs Reviewed - No data to display Imaging Review No results found.  MDM  No diagnosis found. Patient presenting with irritation of the left eye after scratching it 3-4 days ago.  On exam, there is no foreign body visualized.  No conjunctival injection.  No evidence of corneal abrasion.  Feel that the patient is stable for discharge.      Pascal Lux [place], PA-C 08/23/13 2201

## 2013-08-21 NOTE — ED Notes (Signed)
Pt c/o bilateral eye itching; pt sts scratched eye and now having pain in left eye

## 2013-08-24 NOTE — ED Provider Notes (Signed)
Medical screening examination/treatment/procedure(s) were performed by non-physician practitioner and as supervising physician I was immediately available for consultation/collaboration.   Laray Anger, DO 08/24/13 1352

## 2013-11-06 ENCOUNTER — Other Ambulatory Visit: Payer: Self-pay | Admitting: Family Medicine

## 2013-11-06 ENCOUNTER — Other Ambulatory Visit (HOSPITAL_COMMUNITY)
Admission: RE | Admit: 2013-11-06 | Discharge: 2013-11-06 | Disposition: A | Discharge status: Home / Self Care | Payer: Medicaid Other | Source: Ambulatory Visit | Attending: Family Medicine | Admitting: Family Medicine

## 2013-11-06 DIAGNOSIS — Z01419 Encounter for gynecological examination (general) (routine) without abnormal findings: Secondary | ICD-10-CM | POA: Insufficient documentation

## 2013-11-06 DIAGNOSIS — Z1151 Encounter for screening for human papillomavirus (HPV): Secondary | ICD-10-CM | POA: Insufficient documentation

## 2013-11-07 ENCOUNTER — Encounter: Payer: Self-pay | Admitting: Nurse Practitioner

## 2013-11-15 ENCOUNTER — Ambulatory Visit (INDEPENDENT_AMBULATORY_CARE_PROVIDER_SITE_OTHER): Payer: Medicaid Other | Admitting: Nurse Practitioner

## 2013-11-15 ENCOUNTER — Encounter: Payer: Self-pay | Admitting: Nurse Practitioner

## 2013-11-15 VITALS — BP 118/72 | HR 72 | Ht 67.0 in | Wt 119.2 lb

## 2013-11-15 DIAGNOSIS — F319 Bipolar disorder, unspecified: Secondary | ICD-10-CM

## 2013-11-15 DIAGNOSIS — K5900 Constipation, unspecified: Secondary | ICD-10-CM

## 2013-11-15 DIAGNOSIS — K589 Irritable bowel syndrome without diarrhea: Secondary | ICD-10-CM
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
## 2013-11-15 NOTE — Patient Instructions (Signed)
Start 1 dulcolax today and 1 tomorrow  Continue to use colace at night Use Metamucil daily  Follow up with Dr Juanda Chance in 2 months. CC:  Renaye Rakers MD

## 2013-11-16 ENCOUNTER — Other Ambulatory Visit: Payer: Self-pay | Admitting: Internal Medicine

## 2013-11-16 ENCOUNTER — Encounter: Payer: Self-pay | Admitting: *Deleted

## 2013-11-16 NOTE — Progress Notes (Signed)
Reviewed and agree.

## 2013-12-31 DIAGNOSIS — F3189 Other bipolar disorder: Secondary | ICD-10-CM | POA: Diagnosis not present

## 2014-01-03 DIAGNOSIS — F3189 Other bipolar disorder: Secondary | ICD-10-CM | POA: Diagnosis not present

## 2014-01-08 DIAGNOSIS — F3189 Other bipolar disorder: Secondary | ICD-10-CM | POA: Diagnosis not present

## 2014-01-09 DIAGNOSIS — F3189 Other bipolar disorder: Secondary | ICD-10-CM | POA: Diagnosis not present

## 2014-01-10 DIAGNOSIS — F3189 Other bipolar disorder: Secondary | ICD-10-CM | POA: Diagnosis not present

## 2014-01-14 ENCOUNTER — Inpatient Hospital Stay (HOSPITAL_COMMUNITY)
Admission: RE | Admit: 2014-01-14 | Discharge: 2014-01-19 | DRG: 885 | Disposition: A | Discharge status: Home / Self Care | Payer: Medicaid Other | Attending: Psychiatry | Admitting: Psychiatry

## 2014-01-14 ENCOUNTER — Encounter (HOSPITAL_COMMUNITY): Payer: Self-pay | Admitting: *Deleted

## 2014-01-14 DIAGNOSIS — F4310 Post-traumatic stress disorder, unspecified: Secondary | ICD-10-CM | POA: Diagnosis present

## 2014-01-14 DIAGNOSIS — K589 Irritable bowel syndrome without diarrhea: Secondary | ICD-10-CM | POA: Diagnosis present

## 2014-01-14 DIAGNOSIS — F3110 Bipolar disorder, current episode manic without psychotic features, unspecified: Principal | ICD-10-CM | POA: Diagnosis present

## 2014-01-14 DIAGNOSIS — F339 Major depressive disorder, recurrent, unspecified: Secondary | ICD-10-CM | POA: Diagnosis present

## 2014-01-14 DIAGNOSIS — F1120 Opioid dependence, uncomplicated: Secondary | ICD-10-CM

## 2014-01-14 DIAGNOSIS — F3160 Bipolar disorder, current episode mixed, unspecified: Secondary | ICD-10-CM

## 2014-01-14 DIAGNOSIS — F3189 Other bipolar disorder: Secondary | ICD-10-CM | POA: Diagnosis not present

## 2014-01-14 DIAGNOSIS — F1920 Other psychoactive substance dependence, uncomplicated: Secondary | ICD-10-CM

## 2014-01-14 DIAGNOSIS — F319 Bipolar disorder, unspecified: Secondary | ICD-10-CM | POA: Diagnosis present

## 2014-01-14 DIAGNOSIS — F332 Major depressive disorder, recurrent severe without psychotic features: Secondary | ICD-10-CM | POA: Diagnosis present

## 2014-01-14 DIAGNOSIS — Z79899 Other long term (current) drug therapy: Secondary | ICD-10-CM

## 2014-01-14 DIAGNOSIS — E039 Hypothyroidism, unspecified: Secondary | ICD-10-CM | POA: Diagnosis present

## 2014-01-14 DIAGNOSIS — R45851 Suicidal ideations: Secondary | ICD-10-CM

## 2014-01-14 MED ORDER — BUPROPION HCL ER (SR) 150 MG PO TB12
150.0000 mg | ORAL_TABLET | Freq: Two times a day (BID) | ORAL | Status: DC
  Administered 2014-01-15 – 2014-01-19 (×8): 150 mg via ORAL
  Filled 2014-01-14: qty 1
  Filled 2014-01-14 (×2): qty 6
  Filled 2014-01-14 (×10): qty 1
  Filled 2014-01-14 (×2): qty 6
  Filled 2014-01-14 (×3): qty 1

## 2014-01-14 MED ORDER — LEVOTHYROXINE SODIUM 125 MCG PO TABS
62.5000 ug | ORAL_TABLET | Freq: Every day | ORAL | Status: DC
  Administered 2014-01-15: 62.5 ug via ORAL
  Filled 2014-01-14 (×3): qty 0.5

## 2014-01-14 MED ORDER — AMPHETAMINE-DEXTROAMPHET ER 10 MG PO CP24
10.0000 mg | ORAL_CAPSULE | Freq: Every day | ORAL | Status: DC
  Administered 2014-01-15: 10 mg via ORAL
  Filled 2014-01-14: qty 1

## 2014-01-14 MED ORDER — GABAPENTIN 100 MG PO CAPS
100.0000 mg | ORAL_CAPSULE | Freq: Three times a day (TID) | ORAL | Status: DC
  Filled 2014-01-14 (×7): qty 1

## 2014-01-14 MED ORDER — LITHIUM CARBONATE 300 MG PO CAPS
300.0000 mg | ORAL_CAPSULE | ORAL | Status: DC
  Administered 2014-01-15 – 2014-01-19 (×5): 300 mg via ORAL
  Filled 2014-01-14: qty 1
  Filled 2014-01-14: qty 9
  Filled 2014-01-14: qty 1
  Filled 2014-01-14: qty 9
  Filled 2014-01-14 (×4): qty 1

## 2014-01-14 MED ORDER — ASPIRIN EC 81 MG PO TBEC
81.0000 mg | DELAYED_RELEASE_TABLET | Freq: Every day | ORAL | Status: DC
  Administered 2014-01-15 – 2014-01-19 (×5): 81 mg via ORAL
  Filled 2014-01-14 (×7): qty 1

## 2014-01-14 MED ORDER — LITHIUM CARBONATE 300 MG PO CAPS
600.0000 mg | ORAL_CAPSULE | Freq: Every day | ORAL | Status: DC
  Administered 2014-01-14 – 2014-01-18 (×5): 600 mg via ORAL
  Filled 2014-01-14 (×8): qty 2

## 2014-01-14 MED ORDER — PROPRANOLOL HCL 10 MG PO TABS
10.0000 mg | ORAL_TABLET | ORAL | Status: DC
  Administered 2014-01-15: 10 mg via ORAL
  Filled 2014-01-14 (×2): qty 1

## 2014-01-14 MED ORDER — TRAZODONE HCL 50 MG PO TABS: 50.0000 mg | ORAL_TABLET | Freq: Every evening | ORAL | Status: DC | PRN

## 2014-01-14 MED ORDER — MAGNESIUM HYDROXIDE 400 MG/5ML PO SUSP: 30.0000 mL | Freq: Every day | ORAL | Status: DC | PRN

## 2014-01-14 MED ORDER — ALUM & MAG HYDROXIDE-SIMETH 200-200-20 MG/5ML PO SUSP: 30.0000 mL | ORAL | Status: DC | PRN

## 2014-01-14 MED ORDER — ACETAMINOPHEN 325 MG PO TABS: 650.0000 mg | ORAL_TABLET | Freq: Four times a day (QID) | ORAL | Status: DC | PRN

## 2014-01-14 MED ORDER — PROPRANOLOL HCL 20 MG PO TABS
20.0000 mg | ORAL_TABLET | Freq: Every day | ORAL | Status: DC
  Filled 2014-01-14 (×2): qty 1
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
## 2014-01-14 NOTE — Tx Team (Signed)
Initial Interdisciplinary Treatment Plan  PATIENT STRENGTHS: (choose at least two) Ability for insight Capable of independent living Motivation for treatment/growth  PATIENT STRESSORS: Medication change or noncompliance   PROBLEM LIST: Problem List/Patient Goals Date to be addressed Date deferred Reason deferred Estimated date of resolution  Feeling hypo-manic & depressed 01/14/14     Need Med adjustment  01/14/14                                                DISCHARGE CRITERIA:  Ability to meet basic life and health needs Adequate post-discharge living arrangements Improved stabilization in mood, thinking, and/or behavior Verbal commitment to aftercare and medication compliance  PRELIMINARY DISCHARGE PLAN: Attend aftercare/continuing care group Attend PHP/IOP  PATIENT/FAMIILY INVOLVEMENT: This treatment plan has been presented to and reviewed with the patient, Anne Little, and/or family member.  The patient and family have been given the opportunity to ask questions and make suggestions.  Anne Little L 01/14/2014, 6:15 PM
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
## 2014-01-14 NOTE — Progress Notes (Signed)
Adult Psychoeducational Group Note  Date:  01/14/2014 Time:  10:05 PM  Group Topic/Focus:  Wrap-Up Group:   The focus of this group is to help patients review their daily goal of treatment and discuss progress on daily workbooks.  Participation Level:  Active  Participation Quality:  Appropriate  Affect:  Appropriate  Cognitive:  Appropriate  Insight: Appropriate  Engagement in Group:  Engaged  Modes of Intervention:  Support  Additional Comments:  Pt stated that the most positive thing to occur for her today was to simply admit herself to the hospital and it has taken a lot of weight off of her shoulders that she has been trying to deal with by herself.   Anne Little 01/14/2014, 10:05 PM

## 2014-01-15 ENCOUNTER — Encounter (HOSPITAL_COMMUNITY): Payer: Self-pay | Admitting: Family

## 2014-01-15 LAB — RAPID URINE DRUG SCREEN, HOSP PERFORMED
Amphetamines: NOT DETECTED
Barbiturates: NOT DETECTED
Benzodiazepines: NOT DETECTED
Cocaine: NOT DETECTED
Opiates: NOT DETECTED
Tetrahydrocannabinol: NOT DETECTED

## 2014-01-15 LAB — CBC WITH DIFFERENTIAL/PLATELET
Basophils Absolute: 0.1 10*3/uL (ref 0.0–0.1)
Basophils Relative: 1 % (ref 0–1)
Eosinophils Absolute: 0.4 10*3/uL (ref 0.0–0.7)
Eosinophils Relative: 8 % — ABNORMAL HIGH (ref 0–5)
HCT: 31.1 % — ABNORMAL LOW (ref 36.0–46.0)
Hemoglobin: 10.6 g/dL — ABNORMAL LOW (ref 12.0–15.0)
Lymphocytes Relative: 30 % (ref 12–46)
Lymphs Abs: 1.6 10*3/uL (ref 0.7–4.0)
MCH: 32.3 pg (ref 26.0–34.0)
MCHC: 34.1 g/dL (ref 30.0–36.0)
MCV: 94.8 fL (ref 78.0–100.0)
Monocytes Absolute: 0.6 10*3/uL (ref 0.1–1.0)
Monocytes Relative: 11 % (ref 3–12)
Neutro Abs: 2.7 10*3/uL (ref 1.7–7.7)
Neutrophils Relative %: 50 % (ref 43–77)
Platelets: 321 10*3/uL (ref 150–400)
RBC: 3.28 MIL/uL — ABNORMAL LOW (ref 3.87–5.11)
RDW: 12.3 % (ref 11.5–15.5)
WBC: 5.3 10*3/uL (ref 4.0–10.5)

## 2014-01-15 LAB — COMPREHENSIVE METABOLIC PANEL
ALT: 20 U/L (ref 0–35)
AST: 16 U/L (ref 0–37)
Albumin: 4 g/dL (ref 3.5–5.2)
Alkaline Phosphatase: 89 U/L (ref 39–117)
BUN: 14 mg/dL (ref 6–23)
CO2: 26 mEq/L (ref 19–32)
Calcium: 9.9 mg/dL (ref 8.4–10.5)
Chloride: 106 mEq/L (ref 96–112)
Creatinine, Ser: 1.1 mg/dL (ref 0.50–1.10)
GFR calc Af Amer: 65 mL/min — ABNORMAL LOW (ref >90)
GFR calc non Af Amer: 56 mL/min — ABNORMAL LOW (ref >90)
Glucose, Bld: 106 mg/dL — ABNORMAL HIGH (ref 70–99)
Potassium: 3.8 mEq/L (ref 3.7–5.3)
Sodium: 142 mEq/L (ref 137–147)
Total Bilirubin: 0.2 mg/dL — ABNORMAL LOW (ref 0.3–1.2)
Total Protein: 7.2 g/dL (ref 6.0–8.3)

## 2014-01-15 LAB — URINALYSIS, DIPSTICK ONLY
Bilirubin Urine: NEGATIVE
Glucose, UA: NEGATIVE mg/dL
Hgb urine dipstick: NEGATIVE
Ketones, ur: NEGATIVE mg/dL
Nitrite: NEGATIVE
Protein, ur: NEGATIVE mg/dL
Specific Gravity, Urine: 1.007 (ref 1.005–1.030)
Urobilinogen, UA: 0.2 mg/dL (ref 0.0–1.0)
pH: 7.5 (ref 5.0–8.0)

## 2014-01-15 LAB — LITHIUM LEVEL: Lithium Lvl: 1.16 mEq/L (ref 0.80–1.40)

## 2014-01-15 MED ORDER — GABAPENTIN 300 MG PO CAPS
900.0000 mg | ORAL_CAPSULE | Freq: Every day | ORAL | Status: DC
  Administered 2014-01-15 – 2014-01-18 (×4): 900 mg via ORAL
  Filled 2014-01-15 (×2): qty 3
  Filled 2014-01-15: qty 9
  Filled 2014-01-15 (×2): qty 3
  Filled 2014-01-15: qty 9
  Filled 2014-01-15 (×2): qty 3

## 2014-01-15 MED ORDER — MINOXIDIL 2 % EX SOLN: CUTANEOUS | Status: DC

## 2014-01-15 MED ORDER — BUPROPION HCL ER (SR) 150 MG PO TB12
150.0000 mg | ORAL_TABLET | Freq: Once | ORAL | Status: AC
  Administered 2014-01-15: 150 mg via ORAL
  Filled 2014-01-15: qty 1

## 2014-01-15 MED ORDER — PROPRANOLOL HCL ER 60 MG PO CP24
60.0000 mg | ORAL_CAPSULE | Freq: Every day | ORAL | Status: DC
  Administered 2014-01-16: 60 mg via ORAL
  Filled 2014-01-15 (×4): qty 1

## 2014-01-15 MED ORDER — LEVOTHYROXINE SODIUM 125 MCG PO TABS
62.5000 ug | ORAL_TABLET | Freq: Every day | ORAL | Status: DC
  Administered 2014-01-16 – 2014-01-19 (×4): 62.5 ug via ORAL
  Filled 2014-01-15: qty 0.5
  Filled 2014-01-15: qty 1
  Filled 2014-01-15 (×5): qty 0.5

## 2014-01-15 MED ORDER — BIOTENE DRY MOUTH MT LIQD
15.0000 mL | OROMUCOSAL | Status: DC | PRN
  Administered 2014-01-17: 15 mL via OROMUCOSAL
  Filled 2014-01-15: qty 15

## 2014-01-15 MED ORDER — ESTROGENS CONJUGATED 0.3 MG PO TABS
0.3000 mg | ORAL_TABLET | Freq: Every day | ORAL | Status: DC
  Filled 2014-01-15 (×3): qty 1

## 2014-01-15 MED ORDER — ENSURE COMPLETE PO LIQD
237.0000 mL | ORAL | Status: DC
  Administered 2014-01-15 – 2014-01-16 (×2): 237 mL via ORAL

## 2014-01-15 MED ORDER — DOXEPIN HCL 25 MG PO CAPS
25.0000 mg | ORAL_CAPSULE | Freq: Every day | ORAL | Status: DC
  Administered 2014-01-15 (×2): 25 mg via ORAL
  Filled 2014-01-15 (×5): qty 1

## 2014-01-15 NOTE — Progress Notes (Signed)
D: Pt is anxious in affect and mood. Pt has been adjusting appropriately to the unit's regimen this evening. At this time she is denying any SI/HI/AVH. Pt admits that she has been having issues with having rage towards others and animals. She attempted to verify her PTA medications with this Probation officer. Pt reports taking Wellbutrin only once a day. She refused the night dose. She was unsure of the dosage of her neurontin medication. Pt reports receiving her medications through [facility]. Pt attended group this evening. A: Writer administered scheduled and prn medications to pt. Continued support and availability as needed was extended to this pt. Staff continue to monitor pt with q56min checks.  R: No adverse drug reactions noted. Pt receptive to treatment. Pt remains safe at this time.

## 2014-01-15 NOTE — [facility] Group Notes (Signed)
[place] LCSW Group Therapy  01/15/2014  1:15 PM   Type of Therapy:  Group Therapy  Participation Level:  Active  Participation Quality:  Attentive, Sharing and Supportive  Affect:  Depressed and Flat  Cognitive:  Alert and Oriented  Insight:  Developing/Improving and Engaged  Engagement in Therapy:  Developing/Improving and Engaged  Modes of Intervention:  Activity, Clarification, Confrontation, Discussion, Education, Exploration, Limit-setting, Orientation, Problem-solving, Rapport Building, Art therapist, Socialization and Support  Summary of Progress/Problems: Patient was attentive and engaged with speaker from [place].  Patient was attentive to speaker while they shared their story of dealing with mental health and overcoming it.  Patient expressed interest in their programs and services and received information on their agency.  Patient processed ways they can relate to the speaker.     Tiffeny Minchew Horton, LCSW 01/15/2014 1:30 PM

## 2014-01-15 NOTE — Progress Notes (Signed)
The focus of this group is to educate the patient on the purpose and policies of crisis stabilization and provide a format to answer questions about their admission.  The group details unit policies and expectations of patients while admitted.  Patient did not attend 0900 nurse education orientation group this morning, patient stay in bed sleeping.
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
## 2014-01-15 NOTE — [facility] Suicide Risk Assessment (Signed)
[facility] INPATIENT:  Family/Significant Other Suicide Prevention Education  Suicide Prevention Education:  Education Completed; Almira Bar - mother [phone]),  (name of family member/significant other) has been identified by the patient as the family member/significant other with whom the patient will be residing, and identified as the person(s) who will aid the patient in the event of a mental health crisis (suicidal ideations/suicide attempt).  With written consent from the patient, the family member/significant other has been provided the following suicide prevention education, prior to the and/or following the discharge of the patient.  The suicide prevention education provided includes the following:  Suicide risk factors  Suicide prevention and interventions  National Suicide Hotline telephone number  [facility] assessment telephone number  [facility] Emergency Assistance [place] and/or Residential Mobile Crisis Unit telephone number  Request made of family/significant other to:  Remove weapons (e.g., guns, rifles, knives), all items previously/currently identified as safety concern.    Remove drugs/medications (over-the-counter, prescriptions, illicit drugs), all items previously/currently identified as a safety concern.  The family member/significant other verbalizes understanding of the suicide prevention education information provided.  The family member/significant other agrees to remove the items of safety concern listed above.  Ane Payment 01/15/2014, 2:53 PM

## 2014-01-15 NOTE — Progress Notes (Signed)
Recreation Therapy Notes  Animal-Assisted Activity/Therapy (AAA/T) Program Checklist/Progress Notes Patient Eligibility Criteria Checklist & Daily Group note for Rec Tx Intervention  Date: 01.20.2015 Time: 2:45pm Location: [address]    AAA/T Program Assumption of Risk Form signed by Patient/ or Parent Legal Guardian yes  Patient is free of allergies or sever asthma yes  Patient reports no fear of animals yes  Patient reports no history of cruelty to animals yes   Patient understands his/her participation is voluntary yes  Patient washes hands before animal contact yes  Patient washes hands after animal contact yes  Behavioral Response: Appropriate  Education: Hand Washing, Appropriate Animal Interaction   Education Outcome: Acknowledges understanding   Clinical Observations/Feedback: Patient interacted appropriately with therapy dog team and peers during session. Patient attended session wrapped in a blanket and remained in this posture for majority of session, only sticking arms out for a brief time to pet therapy dog.  Laureen Ochs Maryiah Olvey, LRT/CTRS  Lane Hacker 01/15/2014 4:23 PM

## 2014-01-15 NOTE — Progress Notes (Signed)
Patient ID: Anne Little, female   DOB: [birth year], 53 y.o.   MRN: [id number] D-Patient reports poor sleep last night and she has retreated to bed to rest a lot today.  She reports her appetite is improving and she did have a dietary consult.  Her energy level is low and her ability to pay attention is poor.  She reports her depression is 8/10 and her hopelessness is 9/10.  Says when she came in she was feeling mixed rage and irritability.  She has had self harm thoughts off and on and she does contract for safety.  She has been concerned about scheduling and amounts of meds as she could not remember how she takes them.  Med reconciliation underway.  Call to [facility] confirmed she takes neurontin 900 mg at HS.  Urine speciman obtained,  Patient has been cooperative and mood appears calm.
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
## 2014-01-15 NOTE — Progress Notes (Signed)
NUTRITION ASSESSMENT  Pt identified as at risk on the Malnutrition Screen Tool  INTERVENTION: 1. Educated patient on the importance of nutrition and encouraged intake of food and beverages.  Discussed forming regular eating habits. 2. Discussed weight goals. 3. Supplements: Ensure Complete po every HS, each supplement provides 350 kcal and 13 grams of protein Gave coupons for Ensure for use after discharge.    NUTRITION DIAGNOSIS: Unintentional weight loss related to sub-optimal intake as evidenced by pt report.   Goal: Pt to meet >/= 90% of their estimated nutrition needs.  Monitor:  PO intake  Assessment:  Patient admitted with bipolar disorder and increased rage.  Patient reports a history of an eating disorder and loses her appetite every time that she gets depressed or stressed.  Reports that UBW is 118-122 lbs and that she got as low as 80 lbs at one time years ago.  States that she continues to struggle with body dysmorphia.  States that her appetite is fair with fair intake.  Was on Adderall for the past 4 weeks and lost 5# in the past 2-3 weeks due to forgetting meals and undereating at meals.    53 y.o. female  Height: Ht Readings from Last 1 Encounters:  01/14/14 5\' 6"  (1.676 m)    Weight: Wt Readings from Last 1 Encounters:  01/14/14 117 lb (53.071 kg)    Weight Hx: Wt Readings from Last 10 Encounters:  01/14/14 117 lb (53.071 kg)  11/15/13 119 lb 3.2 oz (54.069 kg)    BMI:  Body mass index is 18.89 kg/(m^2). Pt meets criteria for normal weight based on current BMI but does appear underweight.  Estimated Nutritional Needs: Kcal: 25-30 kcal/kg Protein: > 1 gram protein/kg Fluid: 1 ml/kcal  Diet Order: General Pt is also offered choice of unit snacks mid-morning and mid-afternoon.  Pt is eating as desired.   Lab results and medications reviewed.   Antonieta Iba, RD, LDN Clinical Inpatient Dietitian Pager:  [phone] Weekend and after hours pager:   [phone]

## 2014-01-15 NOTE — Progress Notes (Signed)
Adult Psychoeducational Group Note  Date:  01/15/2014 Time:  8:00 pm  Group Topic/Focus:  Wrap-Up Group:   The focus of this group is to help patients review their daily goal of treatment and discuss progress on daily workbooks.  Participation Level:  Active  Participation Quality:  Appropriate and Sharing  Affect:  Appropriate  Cognitive:  Appropriate  Insight: Appropriate  Engagement in Group:  Engaged  Modes of Intervention:  Discussion, Education, Socialization and Support  Additional Comments:  Pt stated she is honest and has a sense of humor when asked to share two positive character traits. Pt stated that she is in the hospital for her mood.   Kempton Milne 01/15/2014, 11:11 PM

## 2014-01-16 LAB — TSH: TSH: 1.081 u[IU]/mL (ref 0.350–4.500)

## 2014-01-16 MED ORDER — PROPRANOLOL HCL 10 MG PO TABS
10.0000 mg | ORAL_TABLET | Freq: Two times a day (BID) | ORAL | Status: DC
  Administered 2014-01-16 – 2014-01-19 (×5): 10 mg via ORAL
  Filled 2014-01-16: qty 1
  Filled 2014-01-16 (×4): qty 6
  Filled 2014-01-16 (×7): qty 1

## 2014-01-16 MED ORDER — DOXEPIN HCL 25 MG PO CAPS
25.0000 mg | ORAL_CAPSULE | Freq: Every evening | ORAL | Status: DC | PRN
  Filled 2014-01-16: qty 3
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
## 2014-01-16 NOTE — Progress Notes (Signed)
[facility] Group Notes:  (Nursing/MHT/Case Management/Adjunct)  Date:  01/16/2014  Time:  9:19 PM  Type of Therapy:  Group Therapy  Participation Level:  Active  Participation Quality:  Appropriate  Affect:  Appropriate  Cognitive:  Appropriate  Insight:  Appropriate  Engagement in Group:  Engaged  Modes of Intervention:  Discussion  Summary of Progress/Problems:The patient expressed that she receive bad news today but was able to handle things well.The patient sad that she had a good day.  Nash Shearer 01/16/2014, 9:19 PM

## 2014-01-16 NOTE — [facility] Group Notes (Signed)
[facility] LCSW Aftercare Discharge Planning Group Note   01/16/2014 8:45 AM  Participation Quality:  Alert, Appropriate and Oriented  Mood/Affect:  Flat and Depressed  Depression Rating:  0  Anxiety Rating:  0  Thoughts of Suicide:  Pt denies SI/HI  Will you contract for safety?   Yes  Current AVH:  Pt denies  Plan for Discharge/Comments:  Pt attended discharge planning group and actively participated in group.  CSW provided pt with today's workbook.  Pt reports feeling drowsy on her new medication.  Pt states that she can return home in [place] where she lives alone.  Pt has follow up scheduled at [facility] for medication management and therapy.  No further needs voiced by pt at this time.    Transportation Means: Pt reports access to transportation - mom will pick pt up  Supports: No supports mentioned at this time  Regan Lemming, LCSW 01/16/2014 9:51 AM

## 2014-01-16 NOTE — Tx Team (Signed)
Interdisciplinary Treatment Plan Update (Adult)  Date: 01/16/2014  Time Reviewed:  9:45 AM  Progress in Treatment: Attending groups: Yes Participating in groups:  Yes Taking medication as prescribed:  Yes Tolerating medication:  Yes Family/Significant othe contact made: Yes, with pt's mother Patient understands diagnosis:  Yes Discussing patient identified problems/goals with staff:  Yes Medical problems stabilized or resolved:  Yes Denies suicidal/homicidal ideation: Yes Issues/concerns per patient self-inventory:  Yes Other:  New problem(s) identified: N/A  Discharge Plan or Barriers: Pt will follow up at [facility] for medication management and therapy.    Reason for Continuation of Hospitalization: Anxiety Depression Psychosis Medication Stabilization  Comments: N/A  Estimated length of stay: 3-5 days  For review of initial/current patient goals, please see plan of care.  Attendees: Patient:     Family:     Physician:  Dr. Zorita Pang 01/16/2014 9:57 AM   Nursing:   Marilynne Halsted, RN 01/16/2014 9:57 AM   Clinical Social Worker:  Regan Lemming, LCSW 01/16/2014 9:57 AM   Other: Catalina Pizza, PA 01/16/2014 9:57 AM   Other:  Gala Romney, care coordination 01/16/2014 9:57 AM   Other:  Joette Catching, LCSW 01/16/2014 9:57 AM   Other:  Gaylan Gerold, RN 01/16/2014 9:57 AM   Other:  Satira Sark, RN 01/16/2014 9:57 AM   Other:  Norberto Sorenson, care coordinator 01/16/2014 9:57 AM   Other:    Other:    Other:    Other:     Scribe for Treatment Team:   Ane Payment, 01/16/2014 9:57 AM

## 2014-01-16 NOTE — Progress Notes (Signed)
[facility] MD Progress Note  01/16/2014 2:46 PM Anne Little  MRN:  [id number] Subjective:  Patient was seen and chart reviewed the patient has been diagnosed with bipolar disorder most recent episode his mixed symptoms of depression and mania. Patient complains that she has been too sleepy to function in the milieu. Patient was found in her room. Patient staff nurse reported that she has been refusing to take her medication long acting propranolol and requesting short-acting medication. Patient has been somewhat isolated and withdrawn today. Patient has suicidal ideation but contracts for safety. Patient has been compliant with the rest of the medication and has no reported side effects. Diagnosis:   DSM5: Schizophrenia Disorders:   Obsessive-Compulsive Disorders:   Trauma-Stressor Disorders:   Substance/Addictive Disorders:   Depressive Disorders:  Disruptive Mood Dysregulation Disorder (296.99)  Axis I: Bipolar, Depressed  ADL's:  Impaired  Sleep: Good  Appetite:  Fair  Suicidal Ideation:  Patient has suicidal ideation but contracts for safety while in the hospital Homicidal Ideation:  Denied AEB (as evidenced by):  Psychiatric Specialty Exam: ROS  Blood pressure 116/79, pulse 71, temperature 98.2 F (36.8 C), temperature source Oral, resp. rate 16, height $RemoveBe'5\' 6"'NRnWMCSyp$  (1.676 m), weight 53.071 kg (117 lb).Body mass index is 18.89 kg/(m^2).  General Appearance: Disheveled and Guarded  Eye Contact::  Minimal  Speech:  Clear and Coherent and Slow  Volume:  Decreased  Mood:  Anxious, Depressed, Hopeless and Worthless  Affect:  Depressed and Flat  Thought Process:  Goal Directed and Intact  Orientation:  Full (Time, Place, and Person)  Thought Content:  Rumination  Suicidal Thoughts:  Yes.  without intent/plan  Homicidal Thoughts:  No  Memory:  Immediate;   Fair  Judgement:  Impaired  Insight:  Lacking  Psychomotor Activity:  Restlessness  Concentration:  Fair  Recall:  Fair   Akathisia:  Negative  Handed:  Right  AIMS (if indicated):     Assets:  Communication Skills Desire for Improvement Financial Resources/Insurance Housing Physical Health Resilience Social Support Talents/Skills  Sleep:  Number of Hours: 6   Current Medications: Current Facility-Administered Medications  Medication Dose Route Frequency Provider Last Rate Last Dose  . acetaminophen (TYLENOL) tablet 650 mg  650 mg Oral Q6H PRN Benjamine Mola, FNP      . alum & mag hydroxide-simeth (MAALOX/MYLANTA) 200-200-20 MG/5ML suspension 30 mL  30 mL Oral Q4H PRN Benjamine Mola, FNP      . antiseptic oral rinse (BIOTENE) solution 15 mL  15 mL Mouth Rinse PRN Benjamine Mola, FNP      . aspirin EC tablet 81 mg  81 mg Oral Daily Benjamine Mola, FNP   81 mg at 01/16/14 0756  . buPROPion [facility] SR) 12 hr tablet 150 mg  150 mg Oral BID Benjamine Mola, FNP   150 mg at 01/16/14 0756  . doxepin (SINEQUAN) capsule 25 mg  25 mg Oral QHS Laverle Hobby, PA-C   25 mg at 01/15/14 2137  . feeding supplement (ENSURE COMPLETE) (ENSURE COMPLETE) liquid 237 mL  237 mL Oral Q24H Darrol Jump, RD   237 mL at 01/15/14 2000  . gabapentin (NEURONTIN) capsule 900 mg  900 mg Oral QHS Benjamine Mola, FNP   900 mg at 01/15/14 2136  . levothyroxine (SYNTHROID, LEVOTHROID) tablet 62.5 mcg  62.5 mcg Oral QAC breakfast Benjamine Mola, FNP   62.5 mcg at 01/16/14 7564  . lithium carbonate capsule 300 mg  300 mg Oral  Rodney Booze Benjamine Mola, FNP   300 mg at 01/16/14 7062  . lithium carbonate capsule 600 mg  600 mg Oral QHS Benjamine Mola, FNP   600 mg at 01/15/14 2137  . magnesium hydroxide (MILK OF MAGNESIA) suspension 30 mL  30 mL Oral Daily PRN Benjamine Mola, FNP      . minoxidil (ROGAINE) 2 % external solution   Topical BH-qamhs Benjamine Mola, FNP      . propranolol (INDERAL) tablet 10 mg  10 mg Oral BID Durward Parcel, MD        Lab Results:  Results for orders placed during the hospital encounter of  01/14/14 (from the past 48 hour(s))  URINE RAPID DRUG SCREEN (HOSP PERFORMED)     Status: None   Collection Time    01/15/14 11:36 AM      Result Value Range   Opiates NONE DETECTED  NONE DETECTED   Cocaine NONE DETECTED  NONE DETECTED   Benzodiazepines NONE DETECTED  NONE DETECTED   Amphetamines NONE DETECTED  NONE DETECTED   Tetrahydrocannabinol NONE DETECTED  NONE DETECTED   Barbiturates NONE DETECTED  NONE DETECTED   Comment:            DRUG SCREEN FOR MEDICAL PURPOSES     ONLY.  IF CONFIRMATION IS NEEDED     FOR ANY PURPOSE, NOTIFY LAB     WITHIN 5 DAYS.                LOWEST DETECTABLE LIMITS     FOR URINE DRUG SCREEN     Drug Class       Cutoff (ng/mL)     Amphetamine      1000     Barbiturate      200     Benzodiazepine   376     Tricyclics       283     Opiates          300     Cocaine          300     THC              50     Performed at [place] [facility], DIPSTICK ONLY     Status: Abnormal   Collection Time    01/15/14 11:36 AM      Result Value Range   Specific Gravity, Urine 1.007  1.005 - 1.030   pH 7.5  5.0 - 8.0   Glucose, UA NEGATIVE  NEGATIVE mg/dL   Hgb urine dipstick NEGATIVE  NEGATIVE   Bilirubin Urine NEGATIVE  NEGATIVE   Ketones, ur NEGATIVE  NEGATIVE mg/dL   Protein, ur NEGATIVE  NEGATIVE mg/dL   Urobilinogen, UA 0.2  0.0 - 1.0 mg/dL   Nitrite NEGATIVE  NEGATIVE   Leukocytes, UA TRACE (*) NEGATIVE   Comment: Performed at [facility]  CBC WITH DIFFERENTIAL     Status: Abnormal   Collection Time    01/15/14  7:52 PM      Result Value Range   WBC 5.3  4.0 - 10.5 K/uL   RBC 3.28 (*) 3.87 - 5.11 MIL/uL   Hemoglobin 10.6 (*) 12.0 - 15.0 g/dL   HCT 31.1 (*) 36.0 - 46.0 %   MCV 94.8  78.0 - 100.0 fL   MCH 32.3  26.0 - 34.0 pg   MCHC 34.1  30.0 - 36.0 g/dL   RDW 12.3  11.5 - 15.5 %   Platelets 321  150 - 400 K/uL   Neutrophils Relative % 50  43 - 77 %   Neutro Abs 2.7  1.7 - 7.7 K/uL   Lymphocytes  Relative 30  12 - 46 %   Lymphs Abs 1.6  0.7 - 4.0 K/uL   Monocytes Relative 11  3 - 12 %   Monocytes Absolute 0.6  0.1 - 1.0 K/uL   Eosinophils Relative 8 (*) 0 - 5 %   Eosinophils Absolute 0.4  0.0 - 0.7 K/uL   Basophils Relative 1  0 - 1 %   Basophils Absolute 0.1  0.0 - 0.1 K/uL   Comment: Performed at [place] PANEL     Status: Abnormal   Collection Time    01/15/14  7:52 PM      Result Value Range   Sodium 142  137 - 147 mEq/L   Potassium 3.8  3.7 - 5.3 mEq/L   Chloride 106  96 - 112 mEq/L   CO2 26  19 - 32 mEq/L   Glucose, Bld 106 (*) 70 - 99 mg/dL   BUN 14  6 - 23 mg/dL   Creatinine, Ser 1.10  0.50 - 1.10 mg/dL   Calcium 9.9  8.4 - 10.5 mg/dL   Total Protein 7.2  6.0 - 8.3 g/dL   Albumin 4.0  3.5 - 5.2 g/dL   AST 16  0 - 37 U/L   ALT 20  0 - 35 U/L   Alkaline Phosphatase 89  39 - 117 U/L   Total Bilirubin 0.2 (*) 0.3 - 1.2 mg/dL   GFR calc non Af Amer 56 (*) >90 mL/min   GFR calc Af Amer 65 (*) >90 mL/min   Comment: (NOTE)     The eGFR has been calculated using the CKD EPI equation.     This calculation has not been validated in all clinical situations.     eGFR's persistently <90 mL/min signify possible Chronic Kidney     Disease.     Performed at [facility]  TSH     Status: None   Collection Time    01/15/14  7:52 PM      Result Value Range   TSH 1.081  0.350 - 4.500 uIU/mL   Comment: Performed at [facility]     Status: None   Collection Time    01/15/14  7:52 PM      Result Value Range   Lithium Lvl 1.16  0.80 - 1.40 mEq/L   Comment: Performed at [facility]    Physical Findings: AIMS: Facial and Oral Movements Muscles of Facial Expression: None, normal Lips and Perioral Area: None, normal Jaw: None, normal Tongue: None, normal,Extremity Movements Upper (arms, wrists, hands, fingers): None, normal Lower (legs, knees, ankles, toes): None,  normal, Trunk Movements Neck, shoulders, hips: None, normal, Overall Severity Severity of abnormal movements (highest score from questions above): None, normal Incapacitation due to abnormal movements: None, normal Patient's awareness of abnormal movements (rate only patient's report): No Awareness, Dental Status Current problems with teeth and/or dentures?: No Does patient usually wear dentures?: No  CIWA:    COWS:     Treatment Plan Summary: Daily contact with patient to assess and evaluate symptoms and progress in treatment Medication management for bipolar disorder mixed symptoms of depression and mania and also has sleep disturbance   Plan: Treatment Plan/Recommendations:  1. continue for crisis management and stabilization. 2. Medication management to reduce current symptoms to base line and improve the patient's overall level of functioning.  Weight changes doxepin 25 mg as needed for insomnia Change propanolol immediate release 10 mg in the morning and 20 mg in the evening for tremors Continue lithium 300 mg in the morning and 600 mg at bedtime for mood stabilization - will monitor serum lithium level for therapeutic window and blood chemistry and electrolytes Continue Synthroid 62.5 mcg for hypothyroidism Continue Neurontin 900 mg for mood stabilization continue Wellbutrin SR 150 mg for depression  3. Treat health problems as indicated. 4. Develop treatment plan to decrease risk of relapse upon discharge and to reduce the need for readmission. 5. Psycho-social education regarding relapse prevention and self care. 6. Health care follow up as needed for medical problems. 7. Restart home medications where appropriate.   Medical Decision Making Problem Points:  Established problem, worsening (2), Review of last therapy session (1) and Review of psycho-social stressors (1) Data Points:  Review or order clinical lab tests (1) Review or order medicine tests (1) Review of  medication regiment & side effects (2) Review of new medications or change in dosage (2)  I certify that inpatient services furnished can reasonably be expected to improve the patient's condition.   Jasier Calabretta,JANARDHAHA R. 01/16/2014, 2:46 PM

## 2014-01-16 NOTE — Progress Notes (Signed)
Patient ID: Anne Little, female   DOB: Sep 14, 1961, 53 y.o.   MRN: [id number] She has been in bed till lunch tand did go to one group back in bed at this time. She denies seeing or hearing anything says she is in bed because she is tired. Se;f inventory: depression,hopelessness at O's . Denies thought of SI. Has not requested any prn medications.

## 2014-01-17 DIAGNOSIS — F1120 Opioid dependence, uncomplicated: Secondary | ICD-10-CM

## 2014-01-17 DIAGNOSIS — F1920 Other psychoactive substance dependence, uncomplicated: Secondary | ICD-10-CM

## 2014-01-17 DIAGNOSIS — F319 Bipolar disorder, unspecified: Secondary | ICD-10-CM

## 2014-01-17 MED ORDER — DOCUSATE SODIUM 100 MG PO CAPS
100.0000 mg | ORAL_CAPSULE | Freq: Every day | ORAL | Status: DC
  Administered 2014-01-17 – 2014-01-18 (×2): 100 mg via ORAL
  Filled 2014-01-17 (×5): qty 1

## 2014-01-17 NOTE — Progress Notes (Signed)
Recreation Therapy Notes  Date: 01.21.2015 Time: 3:00pm Location: [address] Dayroom   Group Topic: Communication, Team Building, Problem Solving  Goal Area(s) Addresses:  Patient will effectively work with peer towards shared goal.  Patient will identify skill used to make activity successful.  Patient will identify how skills used during activity can be used to reach post d/c goals.   Behavioral Response: Did not attend   Lane Hacker, LRT/CTRS  Lane Hacker 01/17/2014 9:23 AM
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
## 2014-01-17 NOTE — Progress Notes (Signed)
Adult Psychoeducational Group Note  Date:  01/17/2014 Time:  10:32 PM  Group Topic/Focus:  Goals Group:   The focus of this group is to help patients establish daily goals to achieve during treatment and discuss how the patient can incorporate goal setting into their daily lives to aide in recovery.  Participation Level:  Active  Participation Quality:  Appropriate  Affect:  Appropriate  Cognitive:  Appropriate  Insight: Appropriate  Engagement in Group:  Engaged  Modes of Intervention:  Discussion  Additional Comments:  Pt stated that right now she is calm and hope everything will be clear tomorrow.  Olena Leatherwood 01/17/2014, 10:32 PM

## 2014-01-17 NOTE — Progress Notes (Signed)
Recreation Therapy Notes  Animal-Assisted Activity/Therapy (AAA/T) Program Checklist/Progress Notes Patient Eligibility Criteria Checklist & Daily Group note for Rec Tx Intervention  Date: 01.22.2015 Time: 2:45pm Location: [address]    AAA/T Program Assumption of Risk Form signed by Patient/ or Parent Legal Guardian yes  Patient is free of allergies or sever asthma yes  Patient reports no fear of animals yes  Patient reports no history of cruelty to animals yes   Patient understands his/her participation is voluntary yes  Behavioral Response: Did not attend.   Laureen Ochs Darlinda Bellows, LRT/CTRS  Lane Hacker 01/17/2014 3:49 PM

## 2014-01-17 NOTE — Progress Notes (Signed)
Patient ID: Anne Little, female   DOB: 11/02/1961, 53 y.o.   MRN: [id number]  D: Pt. Denies SI/HI and A/V Hallucinations. Patient does not report any pain or discomfort at this time. Patient reports that she slept well, her appetite is good, and her ability to pay attention is improving. Patient is rating her energy low for the day. Patient reports that her depression and hopelessness are 1/10 for the day. Patient reports, "I will take a lie detector test. I am doing well and would like to go home (but you already know that)."  A: Support and encouragement provided to the patient to speak with writer about any questions or concerns. Scheduled medications given to patient per physician's orders except propanolol. Medication was help due to patient's low pulse rate.   R: Patient is receptive and cooperative but minimal. Patient is seen in the milieu and is going to groups. Q15 minute checks are maintained for safety.

## 2014-01-17 NOTE — [facility] Group Notes (Signed)
[facility] LCSW Group Therapy  01/17/2014 1:15 PM   Type of Therapy:  Group Therapy  Participation Level:  Did Not Attend - pt sleeping in her room  Regan Lemming, [place] 01/17/2014 2:17 PM

## 2014-01-17 NOTE — Progress Notes (Signed)
D: Pt is flat in affect, but brightens upon approach. Pt presents anxious-like with this Probation officer. Currently this pt is reporting readiness for discharge. She feels that following up with her provider is sufficient for continuity of care. Pt attended group this evening. Pt observed interacting appropriately within the milieu. Pt was pleasant and cooperative in her treatment this evening.  A: Writer administered scheduled medications to pt. Continued support and availability as needed was extended to this pt. Staff continue to monitor pt with q60min checks.  R: No adverse drug reactions noted. Pt receptive to treatment. Pt remains safe at this time.

## 2014-01-17 NOTE — Progress Notes (Signed)
Patient ID: Anne Little, female   DOB: [birth year], 53 y.o.   MRN: [id number] [facility] MD Progress Note  01/17/2014 11:29 AM Anne Little  MRN:  [id number] Subjective:  Patient has been diagnosed with bipolar disorder most recent episode his mixed symptoms of depression, mood swings, poor concentration and increased sedation. Patient has been distracted and less focused. Patient has been compliant with her medication and has no adverse affects. Patient is calm, cooperative and pleasant during this visit. Patient reported she is feeling much better being in a supportive environment and the free from the stenosis. Patient has been actively participating in unit activities including groups and learning to relax and using coping skills. Patient has  Less suicidal ideation and better sleep and  contracts for safety while in the hospital.   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 rate 16, height _0  (1.676 m), weight 53.071 kg (117 lb).Body mass index is 18.89 kg/(m^2).  General Appearance: Disheveled and Guarded, no abnormal musculoskeletal functions   Eye Contact::  Minimal  Speech:  Clear and Coherent and Slow, language intact and comprehensive   Volume:  Decreased  Mood:  Anxious, Depressed, Hopeless and Worthless  Affect:  Depressed and Flat  Thought Process:  Goal Directed and Intact  Orientation:  Full (Time, Place, and Person)  Thought Content:  Rumination   Suicidal Thoughts:  Yes.  without intent/plan  Homicidal Thoughts:  No  Memory:  Immediate;   Fair  Judgement:  Impaired  Insight:  Lacking  Psychomotor Activity:  Restlessness  Concentration:  Fair, fund of knowledge is good   Recall:  Fair  Akathisia:  Negative  Handed:  Right  AIMS (if indicated):     Assets:  Communication Skills Desire for Improvement Financial Resources/Insurance Housing Physical Health Resilience Social Support Talents/Skills  Sleep:  Number of Hours: 6.75   Current Medications: Current Facility-Administered Medications  Medication Dose Route Frequency Provider Last Rate Last Dose  . acetaminophen (TYLENOL) tablet 650 mg  650 mg Oral Q6H PRN Benjamine Mola, FNP      . alum & mag hydroxide-simeth (MAALOX/MYLANTA) 200-200-20 MG/5ML suspension 30 mL  30 mL Oral Q4H PRN Benjamine Mola, FNP      . antiseptic oral rinse (BIOTENE) solution 15 mL  15 mL Mouth Rinse PRN Benjamine Mola, FNP   15 mL at 01/17/14 0747  . aspirin EC tablet 81 mg  81 mg Oral Daily Benjamine Mola, FNP   81 mg at 01/17/14 0747  . buPROPion [facility] SR) 12 hr tablet 150 mg  150 mg Oral BID Benjamine Mola, FNP   150 mg at 01/17/14 0747  . doxepin (SINEQUAN) capsule 25 mg  25 mg Oral QHS PRN Durward Parcel, MD      . feeding supplement (ENSURE COMPLETE) (ENSURE COMPLETE) liquid 237 mL  237 mL Oral Q24H Darrol Jump, RD   237 mL at 01/16/14 2029  . gabapentin (NEURONTIN) capsule 900 mg  900 mg Oral QHS Benjamine Mola, FNP  900 mg at 01/16/14 2106  . levothyroxine (SYNTHROID, LEVOTHROID) tablet 62.5 mcg  62.5 mcg Oral QAC breakfast Benjamine Mola, FNP   62.5 mcg at 01/17/14 4944  . lithium carbonate capsule 300 mg  300 mg Oral BH-q7a Benjamine Mola, FNP   300 mg at 01/17/14 9675  . lithium carbonate capsule 600 mg  600 mg Oral QHS Benjamine Mola, FNP   600 mg at 01/16/14 2106  . magnesium hydroxide (MILK OF MAGNESIA) suspension 30 mL  30 mL Oral Daily PRN Benjamine Mola, FNP       . minoxidil (ROGAINE) 2 % external solution   Topical BH-qamhs Benjamine Mola, FNP      . propranolol (INDERAL) tablet 10 mg  10 mg Oral BID Durward Parcel, MD   10 mg at 01/16/14 1617    Lab Results:  Results for orders placed during the hospital encounter of 01/14/14 (from the past 48 hour(s))  URINE RAPID DRUG SCREEN (HOSP PERFORMED)     Status: None   Collection Time    01/15/14 11:36 AM      Result Value Range   Opiates NONE DETECTED  NONE DETECTED   Cocaine NONE DETECTED  NONE DETECTED   Benzodiazepines NONE DETECTED  NONE DETECTED   Amphetamines NONE DETECTED  NONE DETECTED   Tetrahydrocannabinol NONE DETECTED  NONE DETECTED   Barbiturates NONE DETECTED  NONE DETECTED   Comment:            DRUG SCREEN FOR MEDICAL PURPOSES     ONLY.  IF CONFIRMATION IS NEEDED     FOR ANY PURPOSE, NOTIFY LAB     WITHIN 5 DAYS.                LOWEST DETECTABLE LIMITS     FOR URINE DRUG SCREEN     Drug Class       Cutoff (ng/mL)     Amphetamine      1000     Barbiturate      200     Benzodiazepine   916     Tricyclics       384     Opiates          300     Cocaine          300     THC              50     Performed at [place], DIPSTICK ONLY     Status: Abnormal   Collection Time    01/15/14 11:36 AM      Result Value Range   Specific Gravity, Urine 1.007  1.005 - 1.030   pH 7.5  5.0 - 8.0   Glucose, UA NEGATIVE  NEGATIVE mg/dL   Hgb urine dipstick NEGATIVE  NEGATIVE   Bilirubin Urine NEGATIVE  NEGATIVE   Ketones, ur NEGATIVE  NEGATIVE mg/dL   Protein, ur NEGATIVE  NEGATIVE mg/dL   Urobilinogen, UA 0.2  0.0 - 1.0 mg/dL   Nitrite NEGATIVE  NEGATIVE   Leukocytes, UA TRACE (*) NEGATIVE   Comment: Performed at [facility]  CBC WITH DIFFERENTIAL     Status: Abnormal   Collection Time    01/15/14  7:52 PM      Result Value Range   WBC 5.3  4.0 - 10.5 K/uL   RBC 3.28 (*) 3.87 - 5.11 MIL/uL   Hemoglobin 10.6 (*) 12.0 -  15.0 g/dL   HCT 31.1 (*) 36.0 - 46.0 %   MCV 94.8  78.0 - 100.0 fL   MCH 32.3  26.0 - 34.0 pg   MCHC 34.1  30.0 - 36.0 g/dL   RDW 12.3  11.5 - 15.5 %   Platelets 321  150 - 400 K/uL   Neutrophils Relative % 50  43 - 77 %   Neutro Abs 2.7  1.7 - 7.7 K/uL   Lymphocytes Relative 30  12 - 46 %   Lymphs Abs 1.6  0.7 - 4.0 K/uL   Monocytes Relative 11  3 - 12 %   Monocytes Absolute 0.6  0.1 - 1.0 K/uL   Eosinophils Relative 8 (*) 0 - 5 %   Eosinophils Absolute 0.4  0.0 - 0.7 K/uL   Basophils Relative 1  0 - 1 %   Basophils Absolute 0.1  0.0 - 0.1 K/uL   Comment: Performed at [place] [facility]     Status: Abnormal   Collection Time    01/15/14  7:52 PM      Result Value Range   Sodium 142  137 - 147 mEq/L   Potassium 3.8  3.7 - 5.3 mEq/L   Chloride 106  96 - 112 mEq/L   CO2 26  19 - 32 mEq/L   Glucose, Bld 106 (*) 70 - 99 mg/dL   BUN 14  6 - 23 mg/dL   Creatinine, Ser 1.10  0.50 - 1.10 mg/dL   Calcium 9.9  8.4 - 10.5 mg/dL   Total Protein 7.2  6.0 - 8.3 g/dL   Albumin 4.0  3.5 - 5.2 g/dL   AST 16  0 - 37 U/L   ALT 20  0 - 35 U/L   Alkaline Phosphatase 89  39 - 117 U/L   Total Bilirubin 0.2 (*) 0.3 - 1.2 mg/dL   GFR calc non Af Amer 56 (*) >90 mL/min   GFR calc Af Amer 65 (*) >90 mL/min   Comment: (NOTE)     The eGFR has been calculated using the CKD EPI equation.     This calculation has not been validated in all clinical situations.     eGFR's persistently <90 mL/min signify possible Chronic Kidney     Disease.     Performed at [facility]  TSH     Status: None   Collection Time    01/15/14  7:52 PM      Result Value Range   TSH 1.081  0.350 - 4.500 uIU/mL   Comment: Performed at [place]     Status: None   Collection Time    01/15/14  7:52 PM      Result Value Range   Lithium Lvl 1.16  0.80 - 1.40 mEq/L   Comment: Performed at [facility]    Physical  Findings: AIMS: Facial and Oral Movements Muscles of Facial Expression: None, normal Lips and Perioral Area: None, normal Jaw: None, normal Tongue: None, normal,Extremity Movements Upper (arms, wrists, hands, fingers): None, normal Lower (legs, knees, ankles, toes): None, normal, Trunk Movements Neck, shoulders, hips: None, normal, Overall Severity Severity of abnormal movements (highest score from questions above): None, normal Incapacitation due to abnormal movements: None, normal Patient's awareness of abnormal movements (rate only patient's report): No Awareness, Dental Status Current problems with teeth and/or dentures?: No Does patient usually wear dentures?: No  CIWA:    COWS:  Treatment Plan Summary: Daily contact with patient to assess and evaluate symptoms and progress in treatment Medication management for bipolar disorder mixed symptoms of depression and mania and also has sleep disturbance   Plan: Treatment Plan/Recommendations:  1. continue for crisis management and stabilization. 2. Medication management to reduce current symptoms to base line and improve the patient's overall level of functioning. Continue doxepin 25 mg as needed for insomnia, she does not require last night  Continue propanolol immediate release 10 mg  every morning  and 20 mg in the evening for tremors Continue lithium 300 mg in the morning and 600 mg at bedtime for mood stabilization -  Patient serum lithium level and thyroid stimulating hormone are within normal range  Continue Synthroid 62.5 mcg for hypothyroidism Continue Neurontin 900 mg for mood stabilization  Continue Wellbutrin SR 150 mg for depression 3. Treat health problems as indicated. 4. Develop treatment plan to decrease risk of relapse upon discharge and to reduce the need for readmission. 5. Psycho-social education regarding relapse prevention and self care. 6. Health care follow up as needed for medical problems. 7.   Disposition Plans Are in Progress .   Medical Decision Making Problem Points:  Established problem, worsening (2), Review of last therapy session (1) and Review of psycho-social stressors (1) Data Points:  Review or order clinical lab tests (1) Review or order medicine tests (1) Review of medication regiment & side effects (2) Review of new medications or change in dosage (2)  I certify that inpatient services furnished can reasonably be expected to improve the patient's condition.   Rasean Joos,JANARDHAHA R. 01/17/2014, 11:29 AM

## 2014-01-17 NOTE — Progress Notes (Signed)
Patient ID: Anne Little, female   DOB: Sep 09, 1961, 53 y.o.   MRN: [id number]  Morning Wellness Group 9:00 AM  The focus of this group is to educate the patient on the purpose and policies of crisis stabilization and provide a format to answer questions about their admission.  The group details unit policies and expectations of patients while admitted.  Patient attended group and listened intently. Patient laughed at jokes but did not speak.

## 2014-01-18 NOTE — Progress Notes (Signed)
Anne Little is seen OOB UAL on the 500 hall today...she tolerates this fairly well. SHe is cautious upon meeting this nurse this morning . She demonstrates some thought-blocking and is observed to have a slow processing speed, ie.Marland KitchenMarland KitchenIt Takes her several seconds to assimilate and respond to what is being said to her.    A  She watches and monitors whomever she is engaged in conversation with, to monitor their response / reaction to whatever she has said. She makes very vague " small talk " with this nurse and shies away from specifics.    R Safety is in place and she completes her AM self invnetory and on it she writes she denies SI within the past 24 hrs, she rates her depression and hopelessness "1/1", respectively and says her plan, today, is to " go to the gym".

## 2014-01-18 NOTE — Progress Notes (Signed)
D: Pt presents with a decreased amount of anxiety in comparison to Wednesday night. She voices that she has not had the feelings of rage that she experienced prior to coming. Pt declined her scheduled Ensure this evening. She reports having adequate meal intakes this evening. Pt is looking forward to going home soon. She is currently denying any SI/HI/AVH.  A: Pt informed of the current discharge plan of following-up with her providers at [facility]. Writer administered scheduled medications to pt. Continued support and availability as needed was extended to this pt. Staff continue to monitor pt with q63min checks.  R: No adverse drug reactions noted. Pt receptive to treatment. Pt understanding of current discharge plan. Pt remains safe at this time.

## 2014-01-18 NOTE — Progress Notes (Signed)
Patient ID: Anne Little, female   DOB: Aug 26, 1961, 53 y.o.   MRN: [id number] [facility] MD Progress Note  01/18/2014 12:36 PM Anne Little  MRN:  [id number] Subjective:  Patient has been diagnosed with bipolar disorder most recent episode his mixed symptoms of depression, mood swings, poor concentration and increased sedation. Patient has been distracted and less focused during inpatient state. Patient has been compliant with her medication and has no adverse affects. Patient is calm, cooperative and pleasant during this visit. Patient reported she is feeling much better being in a supportive environment. Patient has been actively participating in unit activities including groups and learning to relax and using coping skills.   During today's assessment, pt reports good sleep, improving appetite (moderate). Pt minimizes depression/anxiety symptoms, reporting that she "feels much better, but keep thinking about my boyfriend and my relationship outside of here". Pt contracts for safety; denies SI, HI, and AVH. In agreement with medication management and treatment plan. Will continue to monitor.   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 rate 16, height 5\' 6"  (1.676 m), weight 53.071 kg (117 lb).Body mass index is 18.89 kg/(m^2).  General Appearance: Casual,   Eye Contact::  Minimal  Speech:  Clear and Coherent and Slow  Volume:  Normal  Mood:  Euthymic  Affect:  Appropriate  Thought Process:  Goal Directed and Intact  Orientation:  Full  (Time, Place, and Person)  Thought Content:  Rumination  Suicidal Thoughts:  No  Homicidal Thoughts:  No  Memory:  Immediate;   Good  Judgement:  Impaired  Insight:  Lacking  Psychomotor Activity:  Restlessness  Concentration:  Good, fund of knowledge is good   Recall:  Fair  Akathisia:  Negative  Handed:  Right  AIMS (if indicated):     Assets:  Communication Skills Desire for Improvement Financial Resources/Insurance Housing Physical Health Resilience Social Support Talents/Skills  Sleep:  Number of Hours: 6.25   Current Medications: Current Facility-Administered Medications  Medication Dose Route Frequency Provider Last Rate Last Dose  . acetaminophen (TYLENOL) tablet 650 mg  650 mg Oral Q6H PRN Benjamine Mola, FNP      . alum & mag hydroxide-simeth (MAALOX/MYLANTA) 200-200-20 MG/5ML suspension 30 mL  30 mL Oral Q4H PRN Benjamine Mola, FNP      . antiseptic oral rinse (BIOTENE) solution 15 mL  15 mL Mouth Rinse PRN Benjamine Mola, FNP   15 mL at 01/17/14 0747  . aspirin EC tablet 81 mg  81 mg Oral Daily Benjamine Mola, FNP   81 mg at 01/18/14 8937  . buPROPion [facility] SR) 12 hr tablet 150 mg  150 mg Oral BID Benjamine Mola, FNP   150 mg at 01/18/14 3428  . docusate sodium (COLACE) capsule 100 mg  100 mg Oral QHS Evanna Cori Greig Castilla, NP   100 mg at 01/17/14 2130  . doxepin (SINEQUAN) capsule 25 mg  25 mg Oral QHS PRN Durward Parcel, MD      . feeding supplement (ENSURE COMPLETE) (ENSURE COMPLETE) liquid 237 mL  237 mL Oral Q24H  Darrol Jump, RD   237 mL at 01/16/14 2029  . gabapentin (NEURONTIN) capsule 900 mg  900 mg Oral QHS Benjamine Mola, FNP   900 mg at 01/17/14 2130  . levothyroxine (SYNTHROID, LEVOTHROID) tablet 62.5 mcg  62.5 mcg Oral QAC breakfast Benjamine Mola, FNP   62.5 mcg at 01/18/14 7169  . lithium carbonate capsule 300 mg  300 mg Oral BH-q7a Benjamine Mola, FNP   300 mg at 01/18/14 6789  . lithium carbonate capsule 600 mg  600 mg Oral QHS Benjamine Mola, FNP   600 mg at 01/17/14 2130  . magnesium hydroxide (MILK OF MAGNESIA) suspension 30 mL  30 mL Oral Daily PRN Benjamine Mola, FNP      . minoxidil (ROGAINE) 2 % external solution   Topical BH-qamhs Benjamine Mola, FNP      . propranolol (INDERAL) tablet 10 mg  10 mg Oral BID Durward Parcel, MD   10 mg at 01/18/14 3810    Lab Results:  No results found for this or any previous visit (from the past 48 hour(s)).  Physical Findings: AIMS: Facial and Oral Movements Muscles of Facial Expression: None, normal Lips and Perioral Area: None, normal Jaw: None, normal Tongue: None, normal,Extremity Movements Upper (arms, wrists, hands, fingers): None, normal Lower (legs, knees, ankles, toes): None, normal, Trunk Movements Neck, shoulders, hips: None, normal, Overall Severity Severity of abnormal movements (highest score from questions above): None, normal Incapacitation due to abnormal movements: None, normal Patient's awareness of abnormal movements (rate only patient's report): No Awareness, Dental Status Current problems with teeth and/or dentures?: No Does patient usually wear dentures?: No  CIWA:    COWS:     Treatment Plan Summary: Daily contact with patient to assess and evaluate symptoms and progress in treatment Medication management for bipolar disorder mixed symptoms of depression and mania and also has sleep disturbance   Plan: Treatment Plan/Recommendations:  1. continue for crisis management and stabilization. 2. Medication management to reduce current symptoms to base line and improve the patient's overall level of functioning. Continue doxepin 25 mg as needed for insomnia  Continue propanolol immediate release 10 mg  every morning  and 20 mg in the evening for tremors Continue lithium 300 mg in the morning and 600 mg at bedtime for mood stabilization -  Patient serum lithium level and thyroid stimulating hormone are within normal range  Continue  Synthroid 62.5 mcg for hypothyroidism Continue Neurontin 900 mg for mood stabilization  Continue Wellbutrin SR 150 mg for depression 3. Treat health problems as indicated. 4. Develop treatment plan to decrease risk of relapse upon discharge and to reduce the need for readmission. 5. Psycho-social education regarding relapse prevention and self care. 6. Health care follow up as needed for medical problems. 7.  Disposition Plans Are in Progress .   Medical Decision Making Problem Points:  Established problem, worsening (2), Review of last therapy session (1) and Review of psycho-social stressors (1) Data Points:  Review or order clinical lab tests (1) Review or order medicine tests (1) Review of medication regiment & side effects (2) Review of new medications or change in dosage (2)  I certify that inpatient services furnished can reasonably be expected to improve the patient's condition.   Benjamine Mola, FNP-BC 01/18/2014, 12:36 PM

## 2014-01-18 NOTE — BHH Group Notes (Signed)
[facility] LCSW Aftercare Discharge Planning Group Note   01/18/2014 8:45 AM  Participation Quality:  Alert, Appropriate and Oriented  Mood/Affect:  Calm  Depression Rating:  0  Anxiety Rating:  1-2  Thoughts of Suicide:  Pt denies SI/HI  Will you contract for safety?   Yes  Current AVH:  Pt denies  Plan for Discharge/Comments:  Pt attended discharge planning group and actively participated in group.  CSW provided pt with today's workbook.  Pt is hopeful to discharge soon.  Pt states that she can return home in [place] where she lives alone.  Pt has follow up scheduled at [facility] for medication management and therapy.  No further needs voiced by pt at this time.    Transportation Means: Pt reports access to transportation - has own car here  Supports: No supports mentioned at this time  Regan Lemming, Spr[place] 01/18/2014 9:32 AM

## 2014-01-18 NOTE — Progress Notes (Signed)
Recreation Therapy Notes  Date: 01.23.2015 Time: 2:45pm Location: 500 Hall Dayroom   Group Topic: Building Healthy Support System   Goal Area(s) Addresses:  Patient will identify qualities needed to build healthy support system. Patient will identify why those qualities are important.   Behavioral Response: Appropriate   Intervention: Scenario  Activity: Patients were asked to identify all qualities needed to build a healthy support system, as if they were equivalent to ingredients needed to make a cookie recipe.    Education:  Education officer, community, Dentist,   Education Outcome: Acknowledges understanding  Clinical Observations/Feedback: Patient attended group session, listened intently, but made no statements or contributions to group discussion.   Laureen Ochs Landan Fedie, LRT/CTRS  Talib Headley L 01/18/2014 4:40 PM

## 2014-01-18 NOTE — Tx Team (Signed)
Interdisciplinary Treatment Plan Update (Adult)  Date: 01/18/2014  Time Reviewed:  9:45 AM  Progress in Treatment: Attending groups: Yes Participating in groups:  Yes Taking medication as prescribed:  Yes Tolerating medication:  Yes Family/Significant othe contact made: Yes, with pt's mother Patient understands diagnosis:  Yes Discussing patient identified problems/goals with staff:  Yes Medical problems stabilized or resolved:  Yes Denies suicidal/homicidal ideation: Yes Issues/concerns per patient self-inventory:  Yes Other:  New problem(s) identified: N/A  Discharge Plan or Barriers: Pt will follow up at [facility] for medication management and therapy.    Reason for Continuation of Hospitalization: Anxiety Depression Medication Stabilization  Comments: N/A  Estimated length of stay: 2-3 days  For review of initial/current patient goals, please see plan of care.  Attendees: Patient:     Family:     Physician:  Dr. Zorita Pang 01/18/2014 9:55 AM   Nursing:   Drake Leach, RN 01/18/2014 9:55 AM   Clinical Social Worker:  Regan Lemming, LCSW 01/18/2014 9:55 AM   Other:  01/18/2014 9:55 AM   Other:  Gala Romney, care coordination 01/18/2014 9:55 AM   Other:  Joette Catching, LCSW 01/18/2014 9:55 AM   Other:     Other:    Other:    Other:    Other:    Other:    Other:     Scribe for Treatment Team:   Ane Payment, 01/18/2014 9:55 AM

## 2014-01-18 NOTE — Progress Notes (Signed)
Adult Psychoeducational Group Note  Date:  01/18/2014 Time:  10:00am Group Topic/Focus:  Relapse Prevention Planning:   The focus of this group is to define relapse and discuss the need for planning to combat relapse.  Participation Level:  Active  Participation Quality:  Appropriate and Attentive  Affect:  Appropriate  Cognitive:  Alert and Appropriate  Insight: Appropriate  Engagement in Group:  Engaged  Modes of Intervention:  Discussion and Education  Additional Comments:  Pt attended and participated in group. Discussion was on relapse prevention and what their definition is. Pt stated relapse prevention means using tools you need to stay where you are instead of going back to where you were.  Marlowe Shores D 01/18/2014, 1:06 PM

## 2014-01-18 NOTE — [facility] Group Notes (Signed)
[facility] LCSW Group Therapy  01/18/2014  1:15 PM   Type of Therapy:  Group Therapy  Participation Level:  Active  Participation Quality:  Attentive, Sharing and Supportive  Affect:  Depressed and Flat  Cognitive:  Alert and Oriented  Insight:  Developing/Improving and Engaged  Engagement in Therapy:  Developing/Improving and Engaged  Modes of Intervention:  Clarification, Confrontation, Discussion, Education, Exploration, Limit-setting, Orientation, Problem-solving, Rapport Building, Art therapist, Socialization and Support  Summary of Progress/Problems: The topic for today was feelings about relapse.  Pt discussed what relapse prevention is to them and identified triggers that they are on the path to relapse.  Pt processed their feeling towards relapse and was able to relate to peers.  Pt discussed coping skills that can be used for relapse prevention.  Pt shared that relapse for her is scary because she feels she never knows when she will decline or have an episode.  Pt discussed feeling like she will always have to battle her bipolar disorder.  Pt actively participated and was engaged in group discussion.    Regan Lemming, LCSW 01/18/2014 2:20 PM

## 2014-01-18 NOTE — Progress Notes (Signed)
Adult Psychoeducational Group Note  Date:  01/18/2014 Time:  08:00pm Group Topic/Focus:  Wrap-Up Group:   The focus of this group is to help patients review their daily goal of treatment and discuss progress on daily workbooks.  Participation Level:  Active  Participation Quality:  Appropriate and Attentive  Affect:  Appropriate  Cognitive:  Alert and Appropriate  Insight: Appropriate  Engagement in Group:  Engaged  Modes of Intervention:  Discussion and Education  Additional Comments:  Pt attended and participated in group. Discussion was on how their day went. Pt stated her day was good felt good nothing terrible happened.  Marlowe Shores D 01/18/2014, 9:11 PM

## 2014-01-18 NOTE — Progress Notes (Signed)
(  Discharging Saturday or Sunday) [facility] Adult Case Management Discharge Plan :  Will you be returning to the same living situation after discharge: Yes,  returning home At discharge, do you have transportation home?:Yes,  mother will pick pt up or pt has her car here Do you have the ability to pay for your medications:Yes,  access to meds - pt has prescriptions and verbalizes ability to pay for meds.   Release of information consent forms completed and in the chart;  Patient's signature needed at discharge.  Patient to Follow up at: Follow-up Information   Follow up with [facility] On 01/24/2014. (Appointment scheduled at 11:00 am on this date with Myrtie Soman for therapy)    Contact information:   [address] [place], [place] [address] Phone: [phone] Fax: [phone]      Follow up with [facility]. (Resume services with Dr. Silvio Pate for medication management (schedule appointment when she returns from leave))    Contact information:   [address] [place], [place] [address] Phone: [phone] Fax: [phone]      Patient denies SI/HI:   Yes,  denies SI/HI    Safety Planning and Suicide Prevention discussed:  Yes,  discussed with pt and pt's mother.  See suicide prevention education note.   Ane Payment 01/18/2014, 3:33 PM

## 2014-01-19 DIAGNOSIS — F3160 Bipolar disorder, current episode mixed, unspecified: Secondary | ICD-10-CM

## 2014-01-19 MED ORDER — DOCUSATE SODIUM 100 MG PO CAPS
100.0000 mg | ORAL_CAPSULE | Freq: Every day | ORAL | Status: DC
Start: 2014-01-19 — End: 2015-01-21

## 2014-01-19 MED ORDER — GABAPENTIN 300 MG PO CAPS: 900.0000 mg | ORAL_CAPSULE | Freq: Every day | ORAL | Status: AC

## 2014-01-19 MED ORDER — LITHIUM CARBONATE 300 MG PO CAPS
ORAL_CAPSULE | ORAL | Status: DC
Start: 2014-01-19 — End: 2014-01-19

## 2014-01-19 MED ORDER — DOXEPIN HCL 25 MG PO CAPS: 25.0000 mg | ORAL_CAPSULE | Freq: Every evening | ORAL | Status: DC | PRN

## 2014-01-19 MED ORDER — PROPRANOLOL HCL 10 MG PO TABS: 10.0000 mg | ORAL_TABLET | Freq: Two times a day (BID) | ORAL | Status: DC

## 2014-01-19 MED ORDER — LEVOTHYROXINE SODIUM 25 MCG PO TABS
62.5000 ug | ORAL_TABLET | Freq: Every day | ORAL | Status: DC
Start: 2014-01-19 — End: 2014-08-12

## 2014-01-19 MED ORDER — GABAPENTIN 300 MG PO CAPS: 900.0000 mg | ORAL_CAPSULE | Freq: Every day | ORAL | Status: DC

## 2014-01-19 MED ORDER — BUPROPION HCL ER (SR) 150 MG PO TB12: 150.0000 mg | ORAL_TABLET | Freq: Two times a day (BID) | ORAL | Status: DC

## 2014-01-19 MED ORDER — LITHIUM CARBONATE 300 MG PO CAPS: ORAL_CAPSULE | ORAL | Status: DC

## 2014-01-19 MED ORDER — ASPIRIN EC 81 MG PO TBEC: 81.0000 mg | DELAYED_RELEASE_TABLET | Freq: Every day | ORAL | Status: DC

## 2014-01-19 NOTE — [facility] Group Notes (Signed)
[facility] Group Notes:  (Nursing/MHT/Case Management/Adjunct)  Date:  01/19/2014  Time:  10:06 AM  Type of Therapy:  Psychoeducational Skills  Participation Level:  Active  Participation Quality:  Appropriate  Affect:  Appropriate  Cognitive:  Alert  Insight:  Appropriate  Engagement in Group:  Engaged  Modes of Intervention:  Discussion  Summary of Progress/Problems:Pt states she wants to take one thing at a time.  Marcello Moores [facility] 01/19/2014, 10:06 AM

## 2014-01-19 NOTE — Progress Notes (Signed)
Patient ID: Anne Little, female   DOB: [birth year], 53 y.o.   MRN: [id number] Patient denies complaint. Denies SI, HI, AVH. Rates anxiety at 1, depression 0. Patient was pleasant, joked with Probation officer. Expression appeared masked, sadness/depression appear to remain present.  Encouragement offered.  Patient safety maintained, Q 15 checks continue.

## 2014-01-19 NOTE — Progress Notes (Signed)
Patient ID: Anne Little, female   DOB: 10-26-1961, 53 y.o.   MRN: [id number] [facility] MD Progress Note  01/19/2014 9:40 AM Anne Little  MRN:  [id number] Subjective:  Patient has been diagnosed with bipolar disorder most recent episode his mixed symptoms of depression, mood swings, poor concentration and increased sedation. She reports agitation and hallucinations after Adderall was added to her medication regimen.  She reports that she has been tolerating the increase in Wellbutrin to 150 mg BID. She denies any suicidal thoughts. She will return home to her boyfriend who is very supportive of her. She denies any side effects from her medication. The patient has been cooperative feels she has meet the maximum benefit from her inpatient treatment.  Diagnosis:   DSM5: Schizophrenia Disorders:   Obsessive-Compulsive Disorders:   Trauma-Stressor Disorders:   Substance/Addictive Disorders:   Depressive Disorders:  Disruptive Mood Dysregulation Disorder (296.99)  Axis I: Bipolar, Depressed  ADL's:  Intact  Sleep: Good  Appetite:  Fair  Suicidal Ideation:  Patient denies any suicidal ideation, intent, or plans.  Homicidal Ideation:  Denied AEB (as evidenced by):  Psychiatric Specialty Exam: ROS   Blood pressure 121/79, pulse 69, temperature 97.8 F (36.6 C), temperature source Oral, resp. rate 16, height 5\' 6"  (1.676 m), weight 53.071 kg (117 lb).Body mass index is 18.89 kg/(m^2).  General Appearance: Casual and Fairly Groomed, no abnormal musculoskeletal functions   Eye Contact::  Good  Speech:  Normal Rate, language intact and comprehensive    Volume:  Decreased  Mood:  Anxious, Depressed, Hopeless and Worthless  Affect:  Depressed and Flat  Thought Process:  Goal Directed and Intact  Orientation:  Full (Time, Place, and Person)  Thought Content:  Rumination  Suicidal Thoughts:  Yes.  without intent/plan  Homicidal Thoughts:  No  Memory:  Immediate;   Good Recent;   Poor   Judgement:  Intact  Insight:  Fair and Lacking  Psychomotor Activity:  Normal  Concentration:  Fair, fund of knowledge is good   Recall:  Fair  Akathisia:  Negative  Handed:  Right  AIMS (if indicated):     Assets:  Communication Skills Desire for Improvement Financial Resources/Insurance Housing Physical Health Resilience Social Support Talents/Skills  Sleep:  Number of Hours: 5.25   Current Medications: Current Facility-Administered Medications  Medication Dose Route Frequency Provider Last Rate Last Dose  . acetaminophen (TYLENOL) tablet 650 mg  650 mg Oral Q6H PRN Benjamine Mola, FNP      . alum & mag hydroxide-simeth (MAALOX/MYLANTA) 200-200-20 MG/5ML suspension 30 mL  30 mL Oral Q4H PRN Benjamine Mola, FNP      . antiseptic oral rinse (BIOTENE) solution 15 mL  15 mL Mouth Rinse PRN Benjamine Mola, FNP   15 mL at 01/17/14 0747  . aspirin EC tablet 81 mg  81 mg Oral Daily Benjamine Mola, FNP   81 mg at 01/19/14 0748  . buPROPion [facility] SR) 12 hr tablet 150 mg  150 mg Oral BID Benjamine Mola, FNP   150 mg at 01/19/14 0748  . docusate sodium (COLACE) capsule 100 mg  100 mg Oral QHS Malena Peer, NP   100 mg at 01/18/14 2145  . doxepin (SINEQUAN) capsule 25 mg  25 mg Oral QHS PRN Durward Parcel, MD      . feeding supplement (ENSURE COMPLETE) (ENSURE COMPLETE) liquid 237 mL  237 mL Oral Q24H Darrol Jump, RD   237 mL at 01/16/14 2029  .  gabapentin (NEURONTIN) capsule 900 mg  900 mg Oral QHS Benjamine Mola, FNP   900 mg at 01/18/14 2145  . levothyroxine (SYNTHROID, LEVOTHROID) tablet 62.5 mcg  62.5 mcg Oral QAC breakfast Benjamine Mola, FNP   62.5 mcg at 01/19/14 0636  . lithium carbonate capsule 300 mg  300 mg Oral BH-q7a Benjamine Mola, FNP   300 mg at 01/19/14 0636  . lithium carbonate capsule 600 mg  600 mg Oral QHS Benjamine Mola, FNP   600 mg at 01/18/14 2145  . magnesium hydroxide (MILK OF MAGNESIA) suspension 30 mL  30 mL Oral Daily PRN Benjamine Mola, FNP      . minoxidil (ROGAINE) 2 % external solution   Topical BH-qamhs Benjamine Mola, FNP      . propranolol (INDERAL) tablet 10 mg  10 mg Oral BID Durward Parcel, MD   10 mg at 01/19/14 4481    Lab Results:  No results found for this or any previous visit (from the past 48 hour(s)).  Physical Findings: AIMS: Facial and Oral Movements Muscles of Facial Expression: None, normal Lips and Perioral Area: None, normal Jaw: None, normal Tongue: None, normal,Extremity Movements Upper (arms, wrists, hands, fingers): None, normal Lower (legs, knees, ankles, toes): None, normal, Trunk Movements Neck, shoulders, hips: None, normal, Overall Severity Severity of abnormal movements (highest score from questions above): None, normal Incapacitation due to abnormal movements: None, normal Patient's awareness of abnormal movements (rate only patient's report): No Awareness, Dental Status Current problems with teeth and/or dentures?: No Does patient usually wear dentures?: No  CIWA:    COWS:     Treatment Plan Summary: Daily contact with patient to assess and evaluate symptoms and progress in treatment Medication management for bipolar disorder mixed symptoms of depression and mania and also has sleep disturbance   Plan: Treatment Plan/Recommendations:  1. Continue for crisis management and stabilization. 2. Medication management to reduce current symptoms to base line and improve the patient's overall level of functioning. Continue propanolol immediate release 10 mg  every morning  and 20 mg in the evening for tremors Continue lithium 300 mg in the morning and 600 mg at bedtime for mood stabilization -  Continue Synthroid 62.5 mcg for hypothyroidism Continue Neurontin 900 mg for mood stabilization  Continue Wellbutrin SR 150 mg BID for depression 3. Will Discharge patient today.   Medical Decision Making Problem Points:  Established problem, worsening (2), Review of  last therapy session (1) and Review of psycho-social stressors (1) Data Points:  Review or order clinical lab tests (1) Review or order medicine tests (1) Review of medication regiment & side effects (2) Review of new medications or change in dosage (2)  I certify that inpatient services furnished can reasonably be expected to improve the patient's condition.   Anne Little 01/19/2014, 9:40 AM

## 2014-01-19 NOTE — [facility] Suicide Risk Assessment (Signed)
Suicide Risk Assessment  Discharge Assessment     Demographic Factors:  Unemployed  Mental Status Per Nursing Assessment::   On Admission:  Suicidal ideation indicated by patient  Current Mental Status by Physician: Psychiatric Specialty Exam: ROS   Blood pressure 121/79, pulse 69, temperature 97.8 F (36.6 C), temperature source Oral, resp. rate 16, height 5\' 6"  (1.676 m), weight 53.071 kg (117 lb).Body mass index is 18.89 kg/(m^2).  General Appearance: Casual and Fairly Groomed, no abnormal musculoskeletal functions   Eye Contact::  Good  Speech:  Normal Rate, language intact and comprehensive    Volume:  Decreased  Mood:  Anxious, Depressed, Hopeless and Worthless  Affect:  Depressed and Flat  Thought Process:  Goal Directed and Intact  Orientation:  Full (Time, Place, and Person)  Thought Content:  Rumination  Suicidal Thoughts:  Yes.  without intent/plan  Homicidal Thoughts:  No  Memory:  Immediate;   Good Recent;   Poor  Judgement:  Intact  Insight:  Fair and Lacking  Psychomotor Activity:  Normal  Concentration:  Fair, fund of knowledge is good   Recall:  Fair  Akathisia:  Negative  Handed:  Right  AIMS (if indicated):     Assets:  Communication Skills Desire for Improvement Financial Resources/Insurance Housing Physical Health Resilience Social Support Talents/Skills  Sleep:  Number of Hours: 5.25    Loss Factors: Decrease in vocational status  Historical Factors: Prior suicide attempts  Risk Reduction Factors:   Sense of responsibility to family, Living with another person, especially a relative, Positive social support, Positive therapeutic relationship and Positive coping skills or problem solving skills  Continued Clinical Symptoms:  Bipolar Disorder:   Mixed State  Cognitive Features That Contribute To Risk:  Polarized thinking    Suicide Risk:  Minimal: No identifiable suicidal ideation.  Patients presenting with no risk factors but with  morbid ruminations; may be classified as minimal risk based on the severity of the depressive symptoms  Discharge Diagnoses:   AXIS I:  Bipolar I Disorder. most recent episode mixed. AXIS II:  No diagnosis AXIS III:   Past Medical History  Diagnosis Date  . Depression   . Bipolar 1 disorder   . Hypothyroidism   . IBS (irritable bowel syndrome)   . Colon polyps   . Anxiety   . GERD (gastroesophageal reflux disease)   . Hyperlipidemia   . Thyroid disease   . Kidney stones   . Hepatitis   . PTSD (post-traumatic stress disorder)   . Hx of adenomatous colonic polyps    AXIS IV:  other psychosocial or environmental problems AXIS V:  51-60 moderate symptoms  Plan Of Care/Follow-up recommendations:  1. Continue propanolol immediate release 10 mg  every morning  and 20 mg in the evening for tremors Continue lithium 300 mg in the morning and 600 mg at bedtime for mood stabilization -  Continue Synthroid 62.5 mcg for hypothyroidism Continue Neurontin 900 mg for mood stabilization  Continue Wellbutrin SR 150 mg BID for depression 3. Will Discharge patient today.   Activity:  Increase as tolerated Diet:  Regualr Tests:  None. Follow up with lithium levels as an outpatient. Other:  Follow up with outpatient psychiatrist.  Is patient on multiple antipsychotic therapies at discharge:  No   Has Patient had three or more failed trials of antipsychotic monotherapy by history:  No  Recommended Plan for Multiple Antipsychotic Therapies: NA  Ugo Thoma 01/19/2014, 9:50 AM
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
## 2014-01-19 NOTE — Progress Notes (Addendum)
Pt appears in good spirits this am and is hopeful she will be dischraged today.She would like to relax more and add responsibilies slowly. She stated,"I want to protect myself from stress." Pt states she does not feel depressed or hopeless today. She does contract for safety and denies SI or HI. 2pm-Pt was walked out to the lobby and was going to drive herself home-She stated her BF would be at home. She stated she felt very calm while here and hopes top continue the calm feeling once at home. Pt denied any SI or HI feelings upon departure. She did retreive all her belongings from her locker.

## 2014-01-21 DIAGNOSIS — F3189 Other bipolar disorder: Secondary | ICD-10-CM | POA: Diagnosis not present

## 2014-01-22 ENCOUNTER — Encounter: Payer: Medicaid Other | Admitting: Internal Medicine

## 2014-01-22 ENCOUNTER — Encounter: Payer: Self-pay | Admitting: Internal Medicine

## 2014-01-22 DIAGNOSIS — F3189 Other bipolar disorder: Secondary | ICD-10-CM | POA: Diagnosis not present

## 2014-01-22 NOTE — Progress Notes (Deleted)
Anne Little [birth year] [id number]  Note: This dictation was prepared with Dragon digital system. Any transcriptional errors that result from this procedure are unintentional.   History of Present Illness: This is a     Past Medical History  Diagnosis Date  . Depression   . Bipolar 1 disorder   . Hypothyroidism   . IBS (irritable bowel syndrome)   . Colon polyps   . Anxiety   . GERD (gastroesophageal reflux disease)   . Hyperlipidemia   . Thyroid disease   . Kidney stones   . Hepatitis   . PTSD (post-traumatic stress disorder)   . Hx of adenomatous colonic polyps     Past Surgical History  Procedure Laterality Date  . Appendectomy    . Laparoscopy for ectopic pregnancy    . Colonoscopy  2013    [facility]    Allergies  Allergen Reactions  . Erythromycin Other (See Comments)    vomiting  . Macrolides And Ketolides Other (See Comments)  . Neomycin Swelling    "topical mycins"  . Penicillins Hives       . Trazodone And Nefazodone     Per pt she experiences a "paradoxical" reaction and shakiness with Trazodone in the past.   . Lamictal [Lamotrigine] Rash    Family history and social history have been reviewed.  Review of Systems:   The remainder of the 10 point ROS is negative except as outlined in the H&P  Physical Exam: General Appearance Well developed, in no distress Eyes  Non icteric  HEENT  Non traumatic, normocephalic  Mouth No lesion, tongue papillated, no cheilosis Neck Supple without adenopathy, thyroid not enlarged, no carotid bruits, no JVD Lungs Clear to auscultation bilaterally COR Normal S1, normal S2, regular rhythm, no murmur, quiet precordium Abdomen  Rectal  Extremities  No pedal edema Skin No lesions Neurological Alert and oriented x 3 Psychological Normal mood and affect  Assessment and Plan:     Anne Little 01/22/2014

## 2014-01-23 NOTE — Progress Notes (Signed)
Patient Discharge Instructions:  After Visit Summary (AVS):   Faxed to:  01/23/14 Discharge Summary Note:   Faxed to:  01/23/14 Psychiatric Admission Assessment Note:   Faxed to:  01/23/14 Suicide Risk Assessment - Discharge Assessment:   Faxed to:  01/23/14 Faxed/Sent to the Next Level Care provider:  01/23/14 Faxed to [facility], 01/23/2014, 4:24 PM

## 2014-01-23 NOTE — Progress Notes (Signed)
This encounter was created in error - please disregard.

## 2014-01-24 DIAGNOSIS — F3189 Other bipolar disorder: Secondary | ICD-10-CM | POA: Diagnosis not present

## 2014-01-28 DIAGNOSIS — F3189 Other bipolar disorder: Secondary | ICD-10-CM | POA: Diagnosis not present

## 2014-01-31 DIAGNOSIS — F3189 Other bipolar disorder: Secondary | ICD-10-CM | POA: Diagnosis not present

## 2014-02-04 DIAGNOSIS — F3189 Other bipolar disorder: Secondary | ICD-10-CM | POA: Diagnosis not present

## 2014-02-05 DIAGNOSIS — F3189 Other bipolar disorder: Secondary | ICD-10-CM | POA: Diagnosis not present

## 2014-02-07 DIAGNOSIS — F3189 Other bipolar disorder: Secondary | ICD-10-CM | POA: Diagnosis not present

## 2014-02-11 DIAGNOSIS — F3189 Other bipolar disorder: Secondary | ICD-10-CM | POA: Diagnosis not present

## 2014-02-14 DIAGNOSIS — F3189 Other bipolar disorder: Secondary | ICD-10-CM | POA: Diagnosis not present

## 2014-02-25 DIAGNOSIS — F3189 Other bipolar disorder: Secondary | ICD-10-CM | POA: Diagnosis not present

## 2014-02-26 DIAGNOSIS — F3189 Other bipolar disorder: Secondary | ICD-10-CM | POA: Diagnosis not present

## 2014-02-28 DIAGNOSIS — F3189 Other bipolar disorder: Secondary | ICD-10-CM | POA: Diagnosis not present

## 2014-03-04 DIAGNOSIS — F3189 Other bipolar disorder: Secondary | ICD-10-CM | POA: Diagnosis not present

## 2014-03-07 DIAGNOSIS — F3189 Other bipolar disorder: Secondary | ICD-10-CM | POA: Diagnosis not present

## 2014-03-08 DIAGNOSIS — F3189 Other bipolar disorder: Secondary | ICD-10-CM | POA: Diagnosis not present

## 2014-03-11 DIAGNOSIS — F3189 Other bipolar disorder: Secondary | ICD-10-CM | POA: Diagnosis not present

## 2014-03-14 DIAGNOSIS — F3189 Other bipolar disorder: Secondary | ICD-10-CM | POA: Diagnosis not present

## 2014-03-15 DIAGNOSIS — F3189 Other bipolar disorder: Secondary | ICD-10-CM | POA: Diagnosis not present

## 2014-03-18 DIAGNOSIS — F3189 Other bipolar disorder: Secondary | ICD-10-CM | POA: Diagnosis not present

## 2014-03-20 DIAGNOSIS — F3189 Other bipolar disorder: Secondary | ICD-10-CM | POA: Diagnosis not present

## 2014-03-22 DIAGNOSIS — F3189 Other bipolar disorder: Secondary | ICD-10-CM | POA: Diagnosis not present

## 2014-03-25 DIAGNOSIS — F3189 Other bipolar disorder: Secondary | ICD-10-CM | POA: Diagnosis not present

## 2014-03-28 DIAGNOSIS — F3189 Other bipolar disorder: Secondary | ICD-10-CM | POA: Diagnosis not present

## 2014-04-01 DIAGNOSIS — F3189 Other bipolar disorder: Secondary | ICD-10-CM | POA: Diagnosis not present

## 2014-04-08 DIAGNOSIS — F3189 Other bipolar disorder: Secondary | ICD-10-CM | POA: Diagnosis not present

## 2014-04-10 DIAGNOSIS — F3189 Other bipolar disorder: Secondary | ICD-10-CM | POA: Diagnosis not present

## 2014-04-12 DIAGNOSIS — F3189 Other bipolar disorder: Secondary | ICD-10-CM | POA: Diagnosis not present

## 2014-04-15 DIAGNOSIS — F3189 Other bipolar disorder: Secondary | ICD-10-CM | POA: Diagnosis not present

## 2014-04-19 DIAGNOSIS — F3189 Other bipolar disorder: Secondary | ICD-10-CM | POA: Diagnosis not present

## 2014-04-22 DIAGNOSIS — F3189 Other bipolar disorder: Secondary | ICD-10-CM | POA: Diagnosis not present

## 2014-04-26 DIAGNOSIS — F3189 Other bipolar disorder: Secondary | ICD-10-CM | POA: Diagnosis not present

## 2014-04-29 DIAGNOSIS — F3189 Other bipolar disorder: Secondary | ICD-10-CM | POA: Diagnosis not present

## 2014-05-01 DIAGNOSIS — F3189 Other bipolar disorder: Secondary | ICD-10-CM | POA: Diagnosis not present

## 2014-05-03 DIAGNOSIS — F3189 Other bipolar disorder: Secondary | ICD-10-CM | POA: Diagnosis not present

## 2014-05-06 DIAGNOSIS — F3189 Other bipolar disorder: Secondary | ICD-10-CM | POA: Diagnosis not present

## 2014-05-08 DIAGNOSIS — F3189 Other bipolar disorder: Secondary | ICD-10-CM | POA: Diagnosis not present

## 2014-05-10 DIAGNOSIS — F3189 Other bipolar disorder: Secondary | ICD-10-CM | POA: Diagnosis not present

## 2014-05-13 DIAGNOSIS — F3189 Other bipolar disorder: Secondary | ICD-10-CM | POA: Diagnosis not present

## 2014-05-15 DIAGNOSIS — F3189 Other bipolar disorder: Secondary | ICD-10-CM | POA: Diagnosis not present

## 2014-05-17 DIAGNOSIS — F3189 Other bipolar disorder: Secondary | ICD-10-CM | POA: Diagnosis not present

## 2014-05-22 DIAGNOSIS — F3189 Other bipolar disorder: Secondary | ICD-10-CM | POA: Diagnosis not present

## 2014-05-24 DIAGNOSIS — F3189 Other bipolar disorder: Secondary | ICD-10-CM | POA: Diagnosis not present

## 2014-05-27 DIAGNOSIS — F3189 Other bipolar disorder: Secondary | ICD-10-CM | POA: Diagnosis not present

## 2014-05-31 DIAGNOSIS — F3189 Other bipolar disorder: Secondary | ICD-10-CM | POA: Diagnosis not present

## 2014-06-04 DIAGNOSIS — F3189 Other bipolar disorder: Secondary | ICD-10-CM | POA: Diagnosis not present

## 2014-06-05 DIAGNOSIS — F3189 Other bipolar disorder: Secondary | ICD-10-CM | POA: Diagnosis not present

## 2014-06-07 DIAGNOSIS — F3189 Other bipolar disorder: Secondary | ICD-10-CM | POA: Diagnosis not present

## 2014-06-10 DIAGNOSIS — F3189 Other bipolar disorder: Secondary | ICD-10-CM | POA: Diagnosis not present

## 2014-06-12 DIAGNOSIS — F3189 Other bipolar disorder: Secondary | ICD-10-CM | POA: Diagnosis not present

## 2014-06-14 DIAGNOSIS — F3189 Other bipolar disorder: Secondary | ICD-10-CM | POA: Diagnosis not present

## 2014-06-17 DIAGNOSIS — F3189 Other bipolar disorder: Secondary | ICD-10-CM | POA: Diagnosis not present

## 2014-06-19 DIAGNOSIS — F3189 Other bipolar disorder: Secondary | ICD-10-CM | POA: Diagnosis not present

## 2014-06-21 DIAGNOSIS — F3189 Other bipolar disorder: Secondary | ICD-10-CM | POA: Diagnosis not present

## 2014-06-24 DIAGNOSIS — F3189 Other bipolar disorder: Secondary | ICD-10-CM | POA: Diagnosis not present

## 2014-06-26 DIAGNOSIS — F3189 Other bipolar disorder: Secondary | ICD-10-CM | POA: Diagnosis not present

## 2014-07-01 DIAGNOSIS — F3189 Other bipolar disorder: Secondary | ICD-10-CM | POA: Diagnosis not present

## 2014-07-03 DIAGNOSIS — F3189 Other bipolar disorder: Secondary | ICD-10-CM | POA: Diagnosis not present

## 2014-07-05 DIAGNOSIS — F3189 Other bipolar disorder: Secondary | ICD-10-CM | POA: Diagnosis not present

## 2014-07-17 ENCOUNTER — Ambulatory Visit (INDEPENDENT_AMBULATORY_CARE_PROVIDER_SITE_OTHER): Payer: Medicare Other | Admitting: Emergency Medicine

## 2014-07-17 VITALS — BP 120/76 | HR 60 | Temp 98.8°F | Resp 16 | Ht 66.0 in | Wt 115.0 lb

## 2014-07-17 DIAGNOSIS — E039 Hypothyroidism, unspecified: Secondary | ICD-10-CM

## 2014-07-17 DIAGNOSIS — F3189 Other bipolar disorder: Secondary | ICD-10-CM | POA: Diagnosis not present

## 2014-07-17 DIAGNOSIS — M549 Dorsalgia, unspecified: Secondary | ICD-10-CM | POA: Diagnosis not present

## 2014-07-17 DIAGNOSIS — F329 Major depressive disorder, single episode, unspecified: Secondary | ICD-10-CM

## 2014-07-17 DIAGNOSIS — F32A Depression, unspecified: Secondary | ICD-10-CM

## 2014-07-17 DIAGNOSIS — D649 Anemia, unspecified: Secondary | ICD-10-CM

## 2014-07-17 DIAGNOSIS — F3289 Other specified depressive episodes: Secondary | ICD-10-CM

## 2014-07-17 LAB — POCT CBC
Granulocyte percent: 59.9 %G (ref 37–80)
HCT, POC: 33.1 % — AB (ref 37.7–47.9)
Hemoglobin: 10.5 g/dL — AB (ref 12.2–16.2)
Lymph, poc: 2 (ref 0.6–3.4)
MCH, POC: 30.6 pg (ref 27–31.2)
MCHC: 31.8 g/dL (ref 31.8–35.4)
MCV: 96.2 fL (ref 80–97)
MID (cbc): 0.6 (ref 0–0.9)
MPV: 8.6 fL (ref 0–99.8)
POC Granulocyte: 3.8 (ref 2–6.9)
POC LYMPH PERCENT: 31.5 %L (ref 10–50)
POC MID %: 8.6 %M (ref 0–12)
Platelet Count, POC: 292 10*3/uL (ref 142–424)
RBC: 3.44 M/uL — AB (ref 4.04–5.48)
RDW, POC: 13 %
WBC: 6.4 10*3/uL (ref 4.6–10.2)

## 2014-07-17 LAB — POCT URINALYSIS DIPSTICK
Bilirubin, UA: NEGATIVE
Blood, UA: NEGATIVE
Glucose, UA: NEGATIVE
Ketones, UA: NEGATIVE
Leukocytes, UA: NEGATIVE
Nitrite, UA: NEGATIVE
Protein, UA: NEGATIVE
Spec Grav, UA: 1.01
Urobilinogen, UA: 0.2
pH, UA: 7

## 2014-07-17 LAB — POCT UA - MICROSCOPIC ONLY
Bacteria, U Microscopic: NEGATIVE
Casts, Ur, LPF, POC: NEGATIVE
Crystals, Ur, HPF, POC: NEGATIVE
Mucus, UA: NEGATIVE
RBC, urine, microscopic: NEGATIVE
Yeast, UA: NEGATIVE

## 2014-07-17 NOTE — Progress Notes (Signed)
   Subjective:    Patient ID: Anne Little, female    DOB: 1961-06-13, 53 y.o.   MRN: [id number]  HPI 53 y.o. Female presents to clinic today with back pain. Reports that she has had a weak urine stream and has also noticed it being a darker color. States that she has had some chills and muscle aches that have since stopped. Reports having had some abnormal blood test recently. States that she has decreased her lithium dose on her own to approximately 300 mg a day while she was in [place]. Took last dose of lithium last night at approximately 10:30.   Review of Systems     Objective:   Physical Exam chest is clear heart regular rate no murmurs there is no CVA discomfort. Abdomen is soft liver and spleen not large. Results for orders placed in visit on 07/17/14  POCT UA - MICROSCOPIC ONLY      Result Value Ref Range   WBC, Ur, HPF, POC 1-2     RBC, urine, microscopic neg     Bacteria, U Microscopic neg     Mucus, UA neg     Epithelial cells, urine per micros 1-2     Crystals, Ur, HPF, POC neg     Casts, Ur, LPF, POC neg     Yeast, UA neg    POCT URINALYSIS DIPSTICK      Result Value Ref Range   Color, UA yellow     Clarity, UA clear     Glucose, UA neg     Bilirubin, UA neg     Ketones, UA neg     Spec Grav, UA 1.010     Blood, UA neg     pH, UA 7.0     Protein, UA neg     Urobilinogen, UA 0.2     Nitrite, UA neg     Leukocytes, UA Negative    POCT CBC      Result Value Ref Range   WBC 6.4  4.6 - 10.2 K/uL   Lymph, poc 2.0  0.6 - 3.4   POC LYMPH PERCENT 31.5  10 - 50 %L   MID (cbc) 0.6  0 - 0.9   POC MID % 8.6  0 - 12 %M   POC Granulocyte 3.8  2 - 6.9   Granulocyte percent 59.9  37 - 80 %G   RBC 3.44 (*) 4.04 - 5.48 M/uL   Hemoglobin 10.5 (*) 12.2 - 16.2 g/dL   HCT, POC 33.1 (*) 37.7 - 47.9 %   MCV 96.2  80 - 97 fL   MCH, POC 30.6  27 - 31.2 pg   MCHC 31.8  31.8 - 35.4 g/dL   RDW, POC 13.0     Platelet Count, POC 292  142 - 424 K/uL   MPV 8.6  0 - 99.8 fL      Assessment & Plan:  Patient does have an anemia. Iron studies were done on this . She has decreased her lithium dosage to 300 mg a day and I advised her to see Dr.  Jordan Hawks  regarding this. Her calcium and creatinine were both elevated on blood work done recently and these were repeated today. I did add a parathormone level.

## 2014-07-18 ENCOUNTER — Other Ambulatory Visit: Payer: Self-pay | Admitting: Emergency Medicine

## 2014-07-18 DIAGNOSIS — E213 Hyperparathyroidism, unspecified: Secondary | ICD-10-CM

## 2014-07-18 LAB — COMPLETE METABOLIC PANEL WITH GFR
ALT: 11 U/L (ref 0–35)
AST: 15 U/L (ref 0–37)
Albumin: 4.5 g/dL (ref 3.5–5.2)
Alkaline Phosphatase: 92 U/L (ref 39–117)
BUN: 12 mg/dL (ref 6–23)
CO2: 25 mEq/L (ref 19–32)
Calcium: 10.2 mg/dL (ref 8.4–10.5)
Chloride: 105 mEq/L (ref 96–112)
Creat: 1.1 mg/dL (ref 0.50–1.10)
GFR, Est African American: 66 mL/min
GFR, Est Non African American: 57 mL/min — ABNORMAL LOW
Glucose, Bld: 95 mg/dL (ref 70–99)
Potassium: 4.4 mEq/L (ref 3.5–5.3)
Sodium: 138 mEq/L (ref 135–145)
Total Bilirubin: 0.6 mg/dL (ref 0.2–1.2)
Total Protein: 7.4 g/dL (ref 6.0–8.3)

## 2014-07-18 LAB — IRON AND TIBC
%SAT: 36 % (ref 20–55)
Iron: 116 ug/dL (ref 42–145)
TIBC: 318 ug/dL (ref 250–470)
UIBC: 202 ug/dL (ref 125–400)

## 2014-07-18 LAB — PTH, INTACT AND CALCIUM
Calcium: 10.3 mg/dL (ref 8.4–10.5)
PTH: 77.8 pg/mL — ABNORMAL HIGH (ref 14.0–72.0)

## 2014-07-18 LAB — LITHIUM LEVEL: Lithium Lvl: 0.5 mEq/L — ABNORMAL LOW (ref 0.80–1.40)

## 2014-07-19 DIAGNOSIS — F3189 Other bipolar disorder: Secondary | ICD-10-CM | POA: Diagnosis not present

## 2014-07-22 DIAGNOSIS — F3189 Other bipolar disorder: Secondary | ICD-10-CM | POA: Diagnosis not present

## 2014-07-23 ENCOUNTER — Telehealth: Payer: Self-pay

## 2014-07-23 NOTE — Telephone Encounter (Signed)
Called patient to schedule appt w Dr. Everlene Farrier for CPE.  No answer. LMVM to CB to schedule appt.

## 2014-07-26 DIAGNOSIS — F3189 Other bipolar disorder: Secondary | ICD-10-CM | POA: Diagnosis not present

## 2014-07-29 DIAGNOSIS — F3189 Other bipolar disorder: Secondary | ICD-10-CM | POA: Diagnosis not present

## 2014-07-31 DIAGNOSIS — F3189 Other bipolar disorder: Secondary | ICD-10-CM | POA: Diagnosis not present

## 2014-08-08 DIAGNOSIS — L708 Other acne: Secondary | ICD-10-CM | POA: Diagnosis not present

## 2014-08-08 DIAGNOSIS — L739 Follicular disorder, unspecified: Secondary | ICD-10-CM | POA: Diagnosis not present

## 2014-08-08 DIAGNOSIS — L819 Disorder of pigmentation, unspecified: Secondary | ICD-10-CM | POA: Diagnosis not present

## 2014-08-08 DIAGNOSIS — F3189 Other bipolar disorder: Secondary | ICD-10-CM | POA: Diagnosis not present

## 2014-08-09 DIAGNOSIS — F3189 Other bipolar disorder: Secondary | ICD-10-CM | POA: Diagnosis not present

## 2014-08-12 ENCOUNTER — Other Ambulatory Visit: Payer: Self-pay | Admitting: Emergency Medicine

## 2014-08-12 DIAGNOSIS — F3189 Other bipolar disorder: Secondary | ICD-10-CM | POA: Diagnosis not present

## 2014-08-13 NOTE — Telephone Encounter (Signed)
Dr Everlene Farrier, pt saw you for check up recently and looks like hypothyroidism was addressed, but I don't see that you have Rxd this for pt previously. Do you want to RF?

## 2014-08-14 ENCOUNTER — Encounter: Payer: Self-pay | Admitting: Endocrinology

## 2014-08-14 ENCOUNTER — Ambulatory Visit (INDEPENDENT_AMBULATORY_CARE_PROVIDER_SITE_OTHER): Payer: Medicare Other | Admitting: Endocrinology

## 2014-08-14 DIAGNOSIS — N951 Menopausal and female climacteric states: Secondary | ICD-10-CM | POA: Insufficient documentation

## 2014-08-14 NOTE — Progress Notes (Signed)
Subjective:    Patient ID: Anne Little, female    DOB: 10-27-1961, 53 y.o.   MRN: [id number]  HPI Pt was first noted to have hypercalcemia in early 2015.  she has h/o urolithiasis and depression, but has never had PUD, pancreatitis, or bony fracture.  she does not take vitamin-D or A supplements.   She reports moderate weakness throughout the body, and assoc myalgias.   Past Medical History  Diagnosis Date  . Depression   . Bipolar 1 disorder   . Hypothyroidism   . IBS (irritable bowel syndrome)   . Colon polyps   . Anxiety   . GERD (gastroesophageal reflux disease)   . Hyperlipidemia   . Thyroid disease   . Kidney stones   . Hepatitis   . PTSD (post-traumatic stress disorder)   . Hx of adenomatous colonic polyps     Past Surgical History  Procedure Laterality Date  . Appendectomy    . Laparoscopy for ectopic pregnancy    . Colonoscopy  2013    [facility]    History   Social History  . Marital Status: Legally Separated    Spouse Name: N/A    Number of Children: N/A  . Years of Education: N/A   Occupational History  . Not on file.   Social History Main Topics  . Smoking status: Former Research scientist (life sciences)  . Smokeless tobacco: Never Used  . Alcohol Use: Yes     Comment: occ  . Drug Use: No  . Sexual Activity: Not on file   Other Topics Concern  . Not on file   Social History Narrative  . No narrative on file    Current Outpatient Prescriptions on File Prior to Visit  Medication Sig Dispense Refill  . buPROPion (WELLBUTRIN SR) 150 MG 12 hr tablet Take 1 tablet (150 mg total) by mouth 2 (two) times daily. depression  60 tablet  0  . docusate sodium (COLACE) 100 MG capsule Take 1 capsule (100 mg total) by mouth at bedtime. constipation  10 capsule  0  . gabapentin (NEURONTIN) 300 MG capsule Take 3 capsules (900 mg total) by mouth at bedtime. agitation  90 capsule  0  . SYNTHROID 25 MCG tablet TAKE 2 AND A 1/2 TABLETS BY MOUTH AT BEDTIME  75 tablet  4   No current  facility-administered medications on file prior to visit.    Allergies  Allergen Reactions  . Erythromycin Other (See Comments)    vomiting  . Macrolides And Ketolides Other (See Comments)  . Neomycin Swelling    "topical mycins"  . Penicillins Hives       . Trazodone And Nefazodone     Per pt she experiences a "paradoxical" reaction and shakiness with Trazodone in the past.   . Lamictal [Lamotrigine] Rash    Family History  Problem Relation Age of Onset  . Colon cancer      great aun  . Colon cancer Maternal Aunt   . Colon cancer Cousin     BP 118/70  Pulse 60  Temp(Src) 97.7 F (36.5 C) (Oral)  Ht 5\' 6"  (1.676 m)  Wt 114 lb (51.71 kg)  BMI 18.41 kg/m2  SpO2 99%      Review of Systems denies weight loss, galactorrhea, hematuria, numbness, arthralgias, abdominal pain, muscle weakness, hypoglycemia, skin rash, sob, diarrhea, rhinorrhea, easy bruising.  She reports menopausal sxs, urinary frequency, blurry vision, fatigue, and difficulty with concentration.      Objective:   Physical Exam  VS: see vs page GEN: no distress HEAD: head: no deformity eyes: no periorbital swelling, no proptosis external nose and ears are normal mouth: no lesion seen NECK: supple, thyroid is not enlarged CHEST WALL: no deformity LUNGS:  Clear to auscultation CV: reg rate and rhythm, no murmur ABD: abdomen is soft, nontender.  no hepatosplenomegaly.  not distended.  no hernia MUSCULOSKELETAL: muscle bulk and strength are grossly normal.  no obvious joint swelling.  gait is normal and steady EXTEMITIES: no deformity.  no ulcer on the feet.  feet are of normal color and temp.  no edema PULSES: dorsalis pedis intact bilat.  no carotid bruit NEURO:  cn 2-12 grossly intact.   readily moves all 4's.  sensation is intact to touch on the feet SKIN:  Normal texture and temperature.  No rash or suspicious lesion is visible.   NODES:  None palpable at the neck PSYCH: alert, well-oriented.   Does not appear anxious nor depressed.   Lab Results  Component Value Date   PTH 77.8* 07/17/2014   CALCIUM 10.3 07/17/2014   i have reviewed the following outside records: Office notes  Chest CT (2002) no mention is made of low bone-density.    Assessment & Plan:  Hyperparathyroidism, mild, uncertain etiology, but probably primary.  there is no indication for surgery now. Bipolar disorder: Li++ may contribute to hypercalcemia.   Menopausal state, new to me: she should be screened for osteoporosis.    Patient is advised the following: Patient Instructions  blood tests are being requested for you today.  We'll contact you with results. If it is normal, no treatment is needed now.  Please come back for a follow-up appointment in 6 months Please have your bone-density done, at the old office.

## 2014-08-14 NOTE — Patient Instructions (Signed)
blood tests are being requested for you today.  We'll contact you with results. If it is normal, no treatment is needed now.  Please come back for a follow-up appointment in 6 months Please have your bone-density done, at the old office.

## 2014-08-15 ENCOUNTER — Other Ambulatory Visit: Payer: Self-pay | Admitting: Endocrinology

## 2014-08-15 LAB — VITAMIN D 25 HYDROXY (VIT D DEFICIENCY, FRACTURES): VITD: 35.38 ng/mL (ref 30.00–100.00)

## 2014-08-15 NOTE — Telephone Encounter (Signed)
Patient was seen yesterday and forgot to get a rx for her synthroid 25mg  2 tab and 1 half to get 62.5  She is out of medication today   Pharm: [facility] [address]   Thank you

## 2014-08-15 NOTE — Telephone Encounter (Signed)
See below ok to refill?

## 2014-08-16 ENCOUNTER — Telehealth: Payer: Self-pay

## 2014-08-16 ENCOUNTER — Telehealth: Payer: Self-pay | Admitting: Endocrinology

## 2014-08-16 DIAGNOSIS — F3189 Other bipolar disorder: Secondary | ICD-10-CM | POA: Diagnosis not present

## 2014-08-16 LAB — PTH, INTACT AND CALCIUM
Calcium: 10 mg/dL (ref 8.4–10.5)
PTH: 66.4 pg/mL (ref 14.0–72.0)

## 2014-08-16 MED ORDER — LEVOTHYROXINE SODIUM 25 MCG PO TABS: ORAL_TABLET | ORAL | Status: DC

## 2014-08-16 NOTE — Telephone Encounter (Signed)
Patient is calling to ask if Dr. Everlene Farrier can give her more synthroid medication because the endocrinologist at [facility] did not refill her mediation. She says her request was put in two days ago at their office. Please advise.

## 2014-08-16 NOTE — Telephone Encounter (Signed)
Patient need refill refaxed to the pharmacy Synthroid.

## 2014-08-16 NOTE — Telephone Encounter (Signed)
Resent medication Pt advised. 

## 2014-08-16 NOTE — Telephone Encounter (Signed)
Rx sent by PCP on 08/13/2014.

## 2014-08-19 DIAGNOSIS — F3189 Other bipolar disorder: Secondary | ICD-10-CM | POA: Diagnosis not present

## 2014-08-26 ENCOUNTER — Ambulatory Visit (INDEPENDENT_AMBULATORY_CARE_PROVIDER_SITE_OTHER)
Admission: RE | Admit: 2014-08-26 | Discharge: 2014-08-26 | Disposition: A | Discharge status: Home / Self Care | Payer: Medicare Other | Source: Ambulatory Visit | Attending: Endocrinology | Admitting: Endocrinology

## 2014-08-26 DIAGNOSIS — N951 Menopausal and female climacteric states: Secondary | ICD-10-CM

## 2014-08-26 DIAGNOSIS — F3189 Other bipolar disorder: Secondary | ICD-10-CM | POA: Diagnosis not present

## 2014-08-27 ENCOUNTER — Telehealth: Payer: Self-pay | Admitting: Radiology

## 2014-08-27 ENCOUNTER — Ambulatory Visit (INDEPENDENT_AMBULATORY_CARE_PROVIDER_SITE_OTHER): Payer: Medicare Other | Admitting: Emergency Medicine

## 2014-08-27 ENCOUNTER — Encounter: Payer: Self-pay | Admitting: Emergency Medicine

## 2014-08-27 VITALS — BP 110/72 | HR 67 | Temp 98.4°F | Resp 16 | Ht 66.0 in | Wt 113.2 lb

## 2014-08-27 DIAGNOSIS — E039 Hypothyroidism, unspecified: Secondary | ICD-10-CM

## 2014-08-27 DIAGNOSIS — Z1231 Encounter for screening mammogram for malignant neoplasm of breast: Secondary | ICD-10-CM | POA: Diagnosis not present

## 2014-08-27 DIAGNOSIS — E785 Hyperlipidemia, unspecified: Secondary | ICD-10-CM | POA: Diagnosis not present

## 2014-08-27 DIAGNOSIS — R5383 Other fatigue: Secondary | ICD-10-CM | POA: Diagnosis not present

## 2014-08-27 DIAGNOSIS — Z23 Encounter for immunization: Secondary | ICD-10-CM | POA: Diagnosis not present

## 2014-08-27 DIAGNOSIS — Z13220 Encounter for screening for lipoid disorders: Secondary | ICD-10-CM

## 2014-08-27 DIAGNOSIS — Z1239 Encounter for other screening for malignant neoplasm of breast: Secondary | ICD-10-CM | POA: Diagnosis not present

## 2014-08-27 DIAGNOSIS — R5381 Other malaise: Secondary | ICD-10-CM | POA: Diagnosis not present

## 2014-08-27 DIAGNOSIS — D509 Iron deficiency anemia, unspecified: Secondary | ICD-10-CM | POA: Diagnosis not present

## 2014-08-27 DIAGNOSIS — Z0000 Encounter for general adult medical examination without abnormal findings: Secondary | ICD-10-CM | POA: Diagnosis not present

## 2014-08-27 DIAGNOSIS — F319 Bipolar disorder, unspecified: Secondary | ICD-10-CM

## 2014-08-27 LAB — POCT URINALYSIS DIPSTICK
Bilirubin, UA: NEGATIVE
Glucose, UA: NEGATIVE
Ketones, UA: NEGATIVE
Nitrite, UA: NEGATIVE
Protein, UA: NEGATIVE
Spec Grav, UA: 1.01
Urobilinogen, UA: 0.2
pH, UA: 6

## 2014-08-27 LAB — LIPID PANEL
Cholesterol: 205 mg/dL — ABNORMAL HIGH (ref 0–200)
HDL: 50 mg/dL (ref >39)
LDL Cholesterol: 128 mg/dL — ABNORMAL HIGH (ref 0–99)
Total CHOL/HDL Ratio: 4.1 Ratio
Triglycerides: 137 mg/dL (ref <150)
VLDL: 27 mg/dL (ref 0–40)

## 2014-08-27 LAB — POCT URINE PREGNANCY: Preg Test, Ur: NEGATIVE

## 2014-08-27 NOTE — Progress Notes (Addendum)
° °  Subjective:   Patient ID: Anne Little, female    DOB: 09-12-1961, 53 y.o.   MRN: [id number]  This chart was scribed for [name], MD by Lowella Petties, ED Scribe. The patient was seen in room 23. Patient's care was started at 4:28 PM.  HPI  HPI Comments: Anne Little is a 53 y.o. female with a history of Bipolar Disorder and chronic depression who presents to the [facility] for a welcome to medicare visit.  She reports that her mood changes often, but she is more stable than she used to be. She was admitted in January to mental health for a manic episode and suicidal thoughts. She reports that she feels much better since reducing her level of lithium. She states that she has much better clarity and ability to multitask, but her mother still helps her with her memory. She denies having a tremor anymore. She is at risk for osteoporosis, and she had a bone density scan done yesterday. She was recently diagnosed with hypothyroidsism.  She reports seeing Dr. Jordan Hawks every six weeks at the [facility], and sometimes more often depending on her medication changes. She reports seeing therapist Norm Parcel. She reports taking 300 MG of Wellbutrin per day, 20 MG of Inderal at night and 10 mg in the morning, Synthroid, Lithium carbonate, and Colace. She reports having a healthy living situation. She reports that her last colonoscopy was done in March of 2013 at [facility]. She states that she has not been seeing a GYN due to her insurance, but she currently has an appointment scheduled. She reports that she is due for a mammogram. She reports seeing a dermatologist.    Review of Systems  Constitutional: Negative for fatigue and unexpected weight change.  Respiratory: Negative for chest tightness and shortness of breath.   Cardiovascular: Negative for chest pain, palpitations and leg swelling.  Gastrointestinal: Negative for abdominal pain and blood in stool.  Neurological: Negative for  dizziness, syncope, light-headedness and headaches.    Objective:  Physical Exam  CONSTITUTIONAL: Well developed/well nourished HEAD: Normocephalic/atraumatic EYES: EOMI/PERRL ENMT: Mucous membranes moist NECK: supple no meningeal signs SPINE:entire spine nontender CV: S1/S2 noted, no murmurs/rubs/gallops noted LUNGS: Lungs are clear to auscultation bilaterally, no apparent distress ABDOMEN: soft, nontender, no rebound or guarding GU:no cva tenderness NEURO: Pt is awake/alert, moves all extremitiesx4 EXTREMITIES: pulses normal, full ROM, reflexes 3+ and symmetrical  SKIN: warm, color normal, she has a 1x 1.5 CM seborrheic keratosis on the mid back.  PSYCH: patient has somewhat pressure speech ,but is alert and oriented and appropriate  Assessment & Plan:  Flu shot given. Recheck hemoglobin for further evaluation of her issues with fatigue. She also has some tingling sensation in her hands and feet I suspect are secondary to her medications. She will followup with Dr. Loanne Drilling regarding her thyroid and parathyroid. Mammogram was ordered . When she returns in 6 weeks I could not see that she had any imaging of the CNS. I think that might be something to consider on her followup visit in 6 weeks I personally performed the services described in this documentation, which was scribed in my presence. The recorded information has been reviewed and is accurate. I personally performed the services described in this documentation, which was scribed in my presence. The recorded information has been reviewed and is accurate.

## 2014-08-27 NOTE — Telephone Encounter (Signed)
I have gotten a fax from [facility] regarding Synthroid. Patients insurance will not cover this medication after current 31 day supply is gone, since it is not on formulary. Dr Loanne Drilling prescribes Synthroid for her, so I have faxed this to his office, this may need prior authorization, or need to be changed to a medication on her formulary.

## 2014-08-27 NOTE — Patient Instructions (Addendum)
Influenza Vaccine (Flu Vaccine, Inactivated or Recombinant) 2014-2015: What You Need to Know 1. Why get vaccinated? Influenza ("flu") is a contagious disease that spreads around the [place] every winter, usually between October and May. Flu is caused by influenza viruses, and is spread mainly by coughing, sneezing, and close contact. Anyone can get flu, but the risk of getting flu is highest among children. Symptoms come on suddenly and may last several days. They can include:  fever/chills  sore throat  muscle aches  fatigue  cough  headache  runny or stuffy nose Flu can make some people much sicker than others. These people include young children, people 65 and older, pregnant women, and people with certain health conditions-such as heart, lung or kidney disease, nervous system disorders, or a weakened immune system. Flu vaccination is especially important for these people, and anyone in close contact with them. Flu can also lead to pneumonia, and make existing medical conditions worse. It can cause diarrhea and seizures in children. Each year thousands of people in the [place] die from flu, and many more are hospitalized. Flu vaccine is the best protection against flu and its complications. Flu vaccine also helps prevent spreading flu from person to person. 2. Inactivated and recombinant flu vaccines You are getting an injectable flu vaccine, which is either an "inactivated" or "recombinant" vaccine. These vaccines do not contain any live influenza virus. They are given by injection with a needle, and often called the "flu shot."  A different live, attenuated (weakened) influenza vaccine is sprayed into the nostrils. This vaccine is described in a separate Vaccine Information Statement. Flu vaccination is recommended every year. Some children 6 months through 8 years of age might need two doses during one year. Flu viruses are always changing. Each year's flu vaccine is made  to protect against 3 or 4 viruses that are likely to cause disease that year. Flu vaccine cannot prevent all cases of flu, but it is the best defense against the disease.  It takes about 2 weeks for protection to develop after the vaccination, and protection lasts several months to a year. Some illnesses that are not caused by influenza virus are often mistaken for flu. Flu vaccine will not prevent these illnesses. It can only prevent influenza. Some inactivated flu vaccine contains a very small amount of a mercury-based preservative called thimerosal. Studies have shown that thimerosal in vaccines is not harmful, but flu vaccines that do not contain a preservative are available. 3. Some people should not get this vaccine Tell the person who gives you the vaccine:  If you have any severe, life-threatening allergies. If you ever had a life-threatening allergic reaction after a dose of flu vaccine, or have a severe allergy to any part of this vaccine, including (for example) an allergy to gelatin, antibiotics, or eggs, you may be advised not to get vaccinated. Most, but not all, types of flu vaccine contain a small amount of egg protein.  If you ever had Guillain-Barr Syndrome (a severe paralyzing illness, also called GBS). Some people with a history of GBS should not get this vaccine. This should be discussed with your doctor.  If you are not feeling well. It is usually okay to get flu vaccine when you have a mild illness, but you might be advised to wait until you feel better. You should come back when you are better. 4. Risks of a vaccine reaction With a vaccine, like any medicine, there is a chance of side   effects. These are usually mild and go away on their own. Problems that could happen after any vaccine:  Brief fainting spells can happen after any medical procedure, including vaccination. Sitting or lying down for about 15 minutes can help prevent fainting, and injuries caused by a fall. Tell  your doctor if you feel dizzy, or have vision changes or ringing in the ears.  Severe shoulder pain and reduced range of motion in the arm where a shot was given can happen, very rarely, after a vaccination.  Severe allergic reactions from a vaccine are very rare, estimated at less than 1 in a million doses. If one were to occur, it would usually be within a few minutes to a few hours after the vaccination. Mild problems following inactivated flu vaccine:  soreness, redness, or swelling where the shot was given  hoarseness  sore, red or itchy eyes  cough  fever  aches  headache  itching  fatigue If these problems occur, they usually begin soon after the shot and last 1 or 2 days. Moderate problems following inactivated flu vaccine:  Young children who get inactivated flu vaccine and pneumococcal vaccine (PCV13) at the same time may be at increased risk for seizures caused by fever. Ask your doctor for more information. Tell your doctor if a child who is getting flu vaccine has ever had a seizure. Inactivated flu vaccine does not contain live flu virus, so you cannot get the flu from this vaccine. As with any medicine, there is a very remote chance of a vaccine causing a serious injury or death. The safety of vaccines is always being monitored. For more information, visit: www.cdc.gov/vaccinesafety/ 5. What if there is a serious reaction? What should I look for?  Look for anything that concerns you, such as signs of a severe allergic reaction, very high fever, or behavior changes. Signs of a severe allergic reaction can include hives, swelling of the face and throat, difficulty breathing, a fast heartbeat, dizziness, and weakness. These would start a few minutes to a few hours after the vaccination. What should I do?  If you think it is a severe allergic reaction or other emergency that can't wait, call 9-1-1 and get the person to the nearest hospital. Otherwise, call your  doctor.  Afterward, the reaction should be reported to the Vaccine Adverse Event Reporting System (VAERS). Your doctor should file this report, or you can do it yourself through the VAERS web site at www.vaers.hhs.gov, or by calling 1-800-822-7967. VAERS does not give medical advice. 6. The National Vaccine Injury Compensation Program The National Vaccine Injury Compensation Program (VICP) is a federal program that was created to compensate people who may have been injured by certain vaccines. Persons who believe they may have been injured by a vaccine can learn about the program and about filing a claim by calling 1-800-338-2382 or visiting the VICP website at www.hrsa.gov/vaccinecompensation. There is a time limit to file a claim for compensation. 7. How can I learn more?  Ask your health care provider.  Call your local or state health department.  Contact the Centers for Disease Control and Prevention (CDC):  Call 1-800-232-4636 (1-800-CDC-INFO) or  Visit CDC's website at www.cdc.gov/flu CDC Vaccine Information Statement (Interim) Inactivated Influenza Vaccine (08/14/2013) Document Released: 10/07/2006 Document Revised: 04/29/2014 Document Reviewed: 11/30/2013 [employer] Patient Information 2015 [employer]. This information is not intended to replace advice given to you by your health care provider. Make sure you discuss any questions you have with your health   care provider. Lumbosacral Strain Lumbosacral strain is a strain of any of the parts that make up your lumbosacral vertebrae. Your lumbosacral vertebrae are the bones that make up the lower third of your backbone. Your lumbosacral vertebrae are held together by muscles and tough, fibrous tissue (ligaments).  CAUSES  A sudden blow to your back can cause lumbosacral strain. Also, anything that causes an excessive stretch of the muscles in the low back can cause this strain. This is typically seen when people exert themselves  strenuously, fall, lift heavy objects, bend, or crouch repeatedly. RISK FACTORS  Physically demanding work.  Participation in pushing or pulling sports or sports that require a sudden twist of the back (tennis, golf, baseball).  Weight lifting.  Excessive lower back curvature.  Forward-tilted pelvis.  Weak back or abdominal muscles or both.  Tight hamstrings. SIGNS AND SYMPTOMS  Lumbosacral strain may cause pain in the area of your injury or pain that moves (radiates) down your leg.  DIAGNOSIS Your health care provider can often diagnose lumbosacral strain through a physical exam. In some cases, you may need tests such as X-ray exams.  TREATMENT  Treatment for your lower back injury depends on many factors that your clinician will have to evaluate. However, most treatment will include the use of anti-inflammatory medicines. HOME CARE INSTRUCTIONS   Avoid hard physical activities (tennis, racquetball, waterskiing) if you are not in proper physical condition for it. This may aggravate or create problems.  If you have a back problem, avoid sports requiring sudden body movements. Swimming and walking are generally safer activities.  Maintain good posture.  Maintain a healthy weight.  For acute conditions, you may put ice on the injured area.  Put ice in a plastic bag.  Place a towel between your skin and the bag.  Leave the ice on for 20 minutes, 2-3 times a day.  When the low back starts healing, stretching and strengthening exercises may be recommended. SEEK MEDICAL CARE IF:  Your back pain is getting worse.  You experience severe back pain not relieved with medicines. SEEK IMMEDIATE MEDICAL CARE IF:   You have numbness, tingling, weakness, or problems with the use of your arms or legs.  There is a change in bowel or bladder control.  You have increasing pain in any area of the body, including your belly (abdomen).  You notice shortness of breath, dizziness, or  feel faint.  You feel sick to your stomach (nauseous), are throwing up (vomiting), or become sweaty.  You notice discoloration of your toes or legs, or your feet get very cold. MAKE SURE YOU:   Understand these instructions.  Will watch your condition.  Will get help right away if you are not doing well or get worse. Document Released: 09/22/2005 Document Revised: 12/18/2013 Document Reviewed: 08/01/2013 [facility] Patient Information 2015 [place], [place]. This information is not intended to replace advice given to you by your health care provider. Make sure you discuss any questions you have with your health care provider.

## 2014-08-27 NOTE — Progress Notes (Deleted)
   Subjective:    Patient ID: Anne Little, female    DOB: 08/24/1961, 53 y.o.   MRN: [id number]  HPI    Review of Systems  Constitutional: Positive for chills, activity change and appetite change.  HENT: Positive for hearing loss.   Eyes: Positive for visual disturbance.  Respiratory: Negative.   Cardiovascular: Negative.   Gastrointestinal: Negative.   Endocrine: Positive for cold intolerance, polydipsia and polyuria.  Genitourinary: Positive for difficulty urinating.  Musculoskeletal: Positive for back pain and myalgias.  Skin: Negative.   Allergic/Immunologic: Negative.   Neurological: Negative.   Hematological: Bruises/bleeds easily.  Psychiatric/Behavioral: Positive for suicidal ideas, behavioral problems, confusion, dysphoric mood, decreased concentration and agitation. The patient is nervous/anxious.        Objective:   Physical Exam        Assessment & Plan:

## 2014-08-28 ENCOUNTER — Telehealth: Payer: Self-pay | Admitting: Radiology

## 2014-08-28 DIAGNOSIS — F3189 Other bipolar disorder: Secondary | ICD-10-CM | POA: Diagnosis not present

## 2014-08-28 LAB — CBC WITH DIFFERENTIAL/PLATELET
Basophils Absolute: 0.1 10*3/uL (ref 0.0–0.1)
Basophils Relative: 1 % (ref 0–1)
Eosinophils Absolute: 0.3 10*3/uL (ref 0.0–0.7)
Eosinophils Relative: 6 % — ABNORMAL HIGH (ref 0–5)
HCT: 33.6 % — ABNORMAL LOW (ref 36.0–46.0)
Hemoglobin: 11.4 g/dL — ABNORMAL LOW (ref 12.0–15.0)
Lymphocytes Relative: 24 % (ref 12–46)
Lymphs Abs: 1.2 10*3/uL (ref 0.7–4.0)
MCH: 31.4 pg (ref 26.0–34.0)
MCHC: 33.9 g/dL (ref 30.0–36.0)
MCV: 92.6 fL (ref 78.0–100.0)
Monocytes Absolute: 0.6 10*3/uL (ref 0.1–1.0)
Monocytes Relative: 11 % (ref 3–12)
Neutro Abs: 3 10*3/uL (ref 1.7–7.7)
Neutrophils Relative %: 58 % (ref 43–77)
Platelets: 308 10*3/uL (ref 150–400)
RBC: 3.63 MIL/uL — ABNORMAL LOW (ref 3.87–5.11)
RDW: 12.3 % (ref 11.5–15.5)
WBC: 5.1 10*3/uL (ref 4.0–10.5)

## 2014-08-28 NOTE — Telephone Encounter (Addendum)
You wanted me to call Dr Olevia Perches about when she needs follow up Colonoscopy. I have placed report from [facility] on your computer, 5 yrs? Did you see this recommendation from [place], or were you questioning the recommendation? I will call Dr Olevia Perches if you are questioning the recommendation, I just want to clarify. Thanks. Amy

## 2014-08-30 ENCOUNTER — Telehealth: Payer: Self-pay | Admitting: Endocrinology

## 2014-08-30 MED ORDER — LEVOTHYROXINE SODIUM 25 MCG PO TABS: ORAL_TABLET | ORAL | Status: DC

## 2014-08-30 NOTE — Telephone Encounter (Signed)
please call patient: Ins won't pay from brand-name synthroid. i have sent a prescription to your pharmacy, for the generic.

## 2014-08-30 NOTE — Telephone Encounter (Signed)
Pt would like a call back

## 2014-08-30 NOTE — Telephone Encounter (Signed)
Called pt back. Pt had a lot of questions about the generic thyroid medication and bone density results. Pt states that she does not want to take the generic medication. The pharmacy stated to her that some individuals that switch from the brand to generic do not respond well. Pt states that she has had a lot of health issues over the past 7 years and she does not want to relapse. Pt is concerned the medication will set her back to where she was constantly tired and hallucinating. Pt states that she would like to come in for an appointment to discuss. Pt scheduled for 09/04/2014.

## 2014-09-03 DIAGNOSIS — F3189 Other bipolar disorder: Secondary | ICD-10-CM | POA: Diagnosis not present

## 2014-09-04 ENCOUNTER — Ambulatory Visit: Payer: Self-pay | Admitting: Endocrinology

## 2014-09-04 ENCOUNTER — Telehealth: Payer: Self-pay

## 2014-09-04 NOTE — Telephone Encounter (Signed)
Discussed with Dr Everlene Farrier, he did not have this information, now it is clear patient needs colonoscopy 5 yrs from previous. Nothing further needed with this. Amy

## 2014-09-04 NOTE — Telephone Encounter (Signed)
Patient LMOVM to have [place] request her mamogram from a fax number  [phone]  Called patient [phone] to advise her to complete a ROI form and fax it to the number above.

## 2014-09-06 DIAGNOSIS — F3189 Other bipolar disorder: Secondary | ICD-10-CM | POA: Diagnosis not present

## 2014-09-09 DIAGNOSIS — F3189 Other bipolar disorder: Secondary | ICD-10-CM | POA: Diagnosis not present

## 2014-09-11 ENCOUNTER — Telehealth: Payer: Self-pay

## 2014-09-11 DIAGNOSIS — F3189 Other bipolar disorder: Secondary | ICD-10-CM | POA: Diagnosis not present

## 2014-09-11 MED ORDER — LEVOTHYROXINE SODIUM 25 MCG PO TABS: ORAL_TABLET | ORAL | Status: DC

## 2014-09-11 MED ORDER — SYNTHROID 75 MCG PO TABS: 75.0000 ug | ORAL_TABLET | Freq: Every day | ORAL | Status: DC

## 2014-09-11 NOTE — Telephone Encounter (Signed)
Rx sent Pt advised 

## 2014-09-11 NOTE — Telephone Encounter (Signed)
Pt is requesting brand name synthroid. Dr. Loanne Drilling sent in generic synthroid. Pt is going to look for a different endocrinologist, but for the mean time would need a brand name rx for her synthroid. She is currently out of her medication.

## 2014-09-11 NOTE — Telephone Encounter (Signed)
Please send in brand-name Synthroid at the current dose she is on.

## 2014-09-11 NOTE — Telephone Encounter (Signed)
Spoke to pt- she is advised that insurance wont cover name brand. Pt will cover cost if not covered.

## 2014-09-11 NOTE — Telephone Encounter (Signed)
Patient wants the name brand for levothyroxine (SYNTHROID, LEVOTHROID) 25 MCG tablet  Out of medication and wants to know if covered by Medicare.    [facility] [address] [facility])  [phone]

## 2014-09-11 NOTE — Telephone Encounter (Signed)
Spoke to pharmacy- she states that she will pay out of pocket for these name brand.

## 2014-09-12 ENCOUNTER — Telehealth: Payer: Self-pay

## 2014-09-12 ENCOUNTER — Other Ambulatory Visit: Payer: Self-pay | Admitting: Physician Assistant

## 2014-09-12 NOTE — Telephone Encounter (Signed)
Patient calling in regards to her refill on the Synthroid. Patient aware our office has already filled it and sent it to her pharmacy. Per patient she has not picked it up and wants to know if we can change it. Patient requesting 2 different prescriptions for her Synthroid. One she wants written fior 15-75mg  and one for 15-50mg . Patient states getting the prescriptions written this way will cost her less out of pocket. Patient request it to be sent to [facility] on [address]. Patients call back number is [phone]

## 2014-09-12 NOTE — Progress Notes (Signed)
Pt needs to call Dr Loanne Drilling office regarding her thyroid medication.

## 2014-09-12 NOTE — Telephone Encounter (Signed)
Patient called again regarding this matter. Please return call. Thank you. Patient states she wants to know if we can call in 4 or 5 tablets until we have time to process her total request.

## 2014-09-13 DIAGNOSIS — F3189 Other bipolar disorder: Secondary | ICD-10-CM | POA: Diagnosis not present

## 2014-09-13 NOTE — Telephone Encounter (Signed)
Patient walk in to our urgent care clinic last night requesting her Synthroid medication. I informed patient per provider since our office has not been prescribing her the medication we are unable to authorize/ or change her Synthroid medication. Patient was told to contact her Endocrinologist if she needed to make any changes to her Synthroid since it was being prescribed by them.

## 2014-09-13 NOTE — Telephone Encounter (Signed)
Spoke to pt, she is aware of change. Spoke to pharm confirmed.

## 2014-09-16 DIAGNOSIS — F3189 Other bipolar disorder: Secondary | ICD-10-CM | POA: Diagnosis not present

## 2014-09-18 DIAGNOSIS — F3189 Other bipolar disorder: Secondary | ICD-10-CM | POA: Diagnosis not present

## 2014-09-20 DIAGNOSIS — F3189 Other bipolar disorder: Secondary | ICD-10-CM | POA: Diagnosis not present

## 2014-09-23 DIAGNOSIS — F3189 Other bipolar disorder: Secondary | ICD-10-CM | POA: Diagnosis not present

## 2014-09-25 DIAGNOSIS — F3189 Other bipolar disorder: Secondary | ICD-10-CM | POA: Diagnosis not present

## 2014-09-30 DIAGNOSIS — F3181 Bipolar II disorder: Secondary | ICD-10-CM | POA: Diagnosis not present

## 2014-10-04 ENCOUNTER — Encounter: Payer: Self-pay | Admitting: Gynecology

## 2014-10-04 ENCOUNTER — Other Ambulatory Visit (HOSPITAL_COMMUNITY)
Admission: RE | Admit: 2014-10-04 | Discharge: 2014-10-04 | Disposition: A | Discharge status: Home / Self Care | Payer: Medicare Other | Source: Ambulatory Visit | Attending: Gynecology | Admitting: Gynecology

## 2014-10-04 ENCOUNTER — Ambulatory Visit (INDEPENDENT_AMBULATORY_CARE_PROVIDER_SITE_OTHER): Payer: Medicare Other | Admitting: Gynecology

## 2014-10-04 VITALS — BP 120/74 | Ht 65.0 in | Wt 109.0 lb

## 2014-10-04 DIAGNOSIS — N930 Postcoital and contact bleeding: Secondary | ICD-10-CM

## 2014-10-04 DIAGNOSIS — Z113 Encounter for screening for infections with a predominantly sexual mode of transmission: Secondary | ICD-10-CM | POA: Diagnosis not present

## 2014-10-04 DIAGNOSIS — M8580 Other specified disorders of bone density and structure, unspecified site: Secondary | ICD-10-CM | POA: Diagnosis not present

## 2014-10-04 DIAGNOSIS — Z124 Encounter for screening for malignant neoplasm of cervix: Secondary | ICD-10-CM | POA: Insufficient documentation

## 2014-10-04 DIAGNOSIS — R8299 Other abnormal findings in urine: Secondary | ICD-10-CM | POA: Diagnosis not present

## 2014-10-04 NOTE — Addendum Note (Signed)
Addended by: Nelva Nay on: 10/04/2014 03:10 PM   Modules accepted: Orders

## 2014-10-04 NOTE — Progress Notes (Addendum)
Anne Little 12/22/1961 [id number]        53 y.o.  [id number] presents Complaining of post coital bleeding. She thinks it is due to skin cracking around the opening to her vagina. She is not having any significant discomfort or vaginal dryness. Has no spontaneous bleeding. No significant hot flushes or night sweats. Is being followed by endocrinology for hypothyroidism and hyperparathyroidism. Last bone density 2015 showed osteopenia with T score -1.6. Normally receives her for health care through Dr. Everlene Farrier and reports a recent full exam with breast exam.  She does report having intercourse last night.   Past medical history,surgical history, problem list, medications, allergies, family history and social history were all reviewed and documented in the EPIC chart.  Directed ROS with pertinent positives and negatives documented in the history of present illness/assessment and plan.  Exam: [name] assistant General appearance:  Normal Abdomen soft nontender without masses guarding rebound Pelvic external BUS vagina with generalized changes. Mild cracking at the posterior fourchette.  Cervix with atrophic changes. GC/Chlamydia done Pap smear done. Uterus normal size anteverted midline mobile nontender. Adnexa without masses or tenderness. Rectovaginal exam is normal.  Assessment/Plan:  53 y.o. [id number] with postcoital bleeding which I think is due to atrophic changes with skin abrasion posterior fourchette. GC/Chlamydia screen and Pap smear done of the cervix. No evidence of vaginitis on exam. Plan Baseline sonohysterogram to rule out higher abnormalities such as polyps or hyperplastic changes. Reviewed options for her atrophic vaginitis to include vaginal estrogen such as Vagifem, vaginal estrogen cream or Osphena. Issues of absorption and global stimulation with risks reviewed. Also discussed traditional HRT. She had been on estrogen in the past but has not been on this for years. She does have osteopenia thin  Caucasian inactive prior smoker. Benefit as far as bone health also discussed. I did review the WHI study with her to include stroke heart attack DVT and breast cancer. The issues about using HRT for bone health also discussed.  Patient wants to think about whether she'll consider starting this. We'll follow up for her sonohysterogram and then we'll go from there.   Anastasio Auerbach MD, 2:52 PM 10/04/2014

## 2014-10-04 NOTE — Patient Instructions (Signed)
Follow up for ultrasound as scheduled 

## 2014-10-05 LAB — URINALYSIS W MICROSCOPIC + REFLEX CULTURE
Bacteria, UA: NONE SEEN
Bilirubin Urine: NEGATIVE
Crystals: NONE SEEN
Glucose, UA: NEGATIVE mg/dL
Hgb urine dipstick: NEGATIVE
Ketones, ur: NEGATIVE mg/dL
Nitrite: NEGATIVE
Protein, ur: NEGATIVE mg/dL
Specific Gravity, Urine: 1.018 (ref 1.005–1.030)
Urobilinogen, UA: 0.2 mg/dL (ref 0.0–1.0)
pH: 5.5 (ref 5.0–8.0)

## 2014-10-05 LAB — GC/CHLAMYDIA PROBE AMP
CT Probe RNA: NEGATIVE
GC Probe RNA: NEGATIVE

## 2014-10-06 LAB — URINE CULTURE: Colony Count: 5000

## 2014-10-08 ENCOUNTER — Encounter: Payer: Self-pay | Admitting: Obstetrics and Gynecology

## 2014-10-08 ENCOUNTER — Ambulatory Visit (INDEPENDENT_AMBULATORY_CARE_PROVIDER_SITE_OTHER): Payer: Medicare Other | Admitting: Emergency Medicine

## 2014-10-08 VITALS — BP 111/78 | HR 67 | Temp 99.0°F | Resp 16 | Ht 66.5 in | Wt 110.0 lb

## 2014-10-08 DIAGNOSIS — E213 Hyperparathyroidism, unspecified: Secondary | ICD-10-CM

## 2014-10-08 DIAGNOSIS — D509 Iron deficiency anemia, unspecified: Secondary | ICD-10-CM

## 2014-10-08 DIAGNOSIS — E0590 Thyrotoxicosis, unspecified without thyrotoxic crisis or storm: Secondary | ICD-10-CM

## 2014-10-08 LAB — CBC WITH DIFFERENTIAL/PLATELET
Basophils Absolute: 0.1 10*3/uL (ref 0.0–0.1)
Basophils Relative: 1 % (ref 0–1)
Eosinophils Absolute: 0.4 10*3/uL (ref 0.0–0.7)
Eosinophils Relative: 7 % — ABNORMAL HIGH (ref 0–5)
HCT: 33.2 % — ABNORMAL LOW (ref 36.0–46.0)
Hemoglobin: 11.2 g/dL — ABNORMAL LOW (ref 12.0–15.0)
Lymphocytes Relative: 27 % (ref 12–46)
Lymphs Abs: 1.5 10*3/uL (ref 0.7–4.0)
MCH: 30.9 pg (ref 26.0–34.0)
MCHC: 33.7 g/dL (ref 30.0–36.0)
MCV: 91.5 fL (ref 78.0–100.0)
Monocytes Absolute: 0.6 10*3/uL (ref 0.1–1.0)
Monocytes Relative: 11 % (ref 3–12)
Neutro Abs: 2.9 10*3/uL (ref 1.7–7.7)
Neutrophils Relative %: 54 % (ref 43–77)
Platelets: 319 10*3/uL (ref 150–400)
RBC: 3.63 MIL/uL — ABNORMAL LOW (ref 3.87–5.11)
RDW: 12.4 % (ref 11.5–15.5)
WBC: 5.4 10*3/uL (ref 4.0–10.5)

## 2014-10-08 LAB — CYTOLOGY - PAP

## 2014-10-08 NOTE — Progress Notes (Signed)
Subjective:  This chart was scribe for Anne Queen, MD by Judithann Sauger, ED Scribe. The patient was seen in Room 23 and the patient's care was started at 1:44 PM.   Patient ID: Anne Little, female    DOB: 01/05/1961, 53 y.o.   MRN: [id number]  HPI HPI Comments: Anne Little is a 53 y.o. female who presents to the [facility] for a referral to a different endocrinologist. She reports that she would like to be in a "big hospital" with different types of doctors to treat her various medical problems. She states that she called [facility] 2 or 3 years ago but they were full. She was interested in a "women and psychiatry" hospital at [facility]. The patient states that she last saw Dr. Jordan Hawks about 2 weeks ago and is treating her bipolar disorder with Lithium and Neutrontin. She reports that she takes 2 Lithium 300 mg at night and 3 Gabapentin 300 mg at night. She manages her depression with Wellbutrin. She also reports that her flu shot is UTD.   Since her last visit on September 1, she saw a gyn and Dr. Phineas Real. She was referred to an endocrinologist but did not like the doctor. Her last colonoscopy was in 2013.      Past Medical History  Diagnosis Date  . Depression   . Bipolar 1 disorder   . Hypothyroidism   . IBS (irritable bowel syndrome)   . Colon polyps   . Anxiety   . GERD (gastroesophageal reflux disease)   . Hyperlipidemia   . Thyroid disease   . Kidney stones   . Hepatitis   . PTSD (post-traumatic stress disorder)   . Hx of adenomatous colonic polyps   . Allergy   . Anemia   . Parathyroid disease   . Endometriosis    Past Surgical History  Procedure Laterality Date  . Appendectomy    . Laparoscopy for ectopic pregnancy    . Colonoscopy  2013    [facility]   Family History  Problem Relation Age of Onset  . Colon cancer      great aun  . Colon cancer Maternal Aunt   . Colon cancer Cousin   . Hyperlipidemia Mother   . Cancer Father     Bladder  .  Hyperlipidemia Father   . Hypertension Father   . Hyperlipidemia Sister   . Hyperlipidemia Maternal Grandmother   . Hypertension Maternal Grandmother   . Stroke Maternal Grandmother   . Mental illness Maternal Grandfather   . Alcoholism Maternal Grandfather   . Heart disease Paternal Grandmother   . Hyperlipidemia Paternal Grandmother   . Hypertension Paternal Grandmother   . Stroke Paternal Grandmother   . Cancer Paternal Grandfather     Colon?   History   Social History  . Marital Status: Divorced    Spouse Name: N/A    Number of Children: N/A  . Years of Education: N/A   Occupational History  . Not on file.   Social History Main Topics  . Smoking status: Former Research scientist (life sciences)  . Smokeless tobacco: Never Used  . Alcohol Use: Yes     Comment: occ  . Drug Use: No  . Sexual Activity: Yes   Other Topics Concern  . Not on file   Social History Narrative  . No narrative on file   Allergies  Allergen Reactions  . Erythromycin Other (See Comments)    vomiting  . Macrolides And Ketolides Other (See Comments)  .  Neomycin Swelling    "topical mycins"  . Penicillins Hives       . Trazodone And Nefazodone     Per pt she experiences a "paradoxical" reaction and shakiness with Trazodone in the past.   . Lamictal [Lamotrigine] Rash     Review of Systems     Objective:   Physical Exam  Nursing note and vitals reviewed. Constitutional: She is oriented to person, place, and time. She appears well-developed and well-nourished.  HENT:  Head: Normocephalic and atraumatic.  Cardiovascular: Normal rate, regular rhythm and normal heart sounds.   No murmur heard. Pulmonary/Chest: Effort normal and breath sounds normal. No respiratory distress. She has no wheezes. She has no rales.  Musculoskeletal: She exhibits no edema.  Neurological: She is alert and oriented to person, place, and time.  Skin: Skin is warm and dry.  Psychiatric: She has a normal mood and affect.        There were no vitals taken for this visit. Assessment & Plan:  I personally performed the services described in this documentation, which was scribed in my presence. The recorded information has been reviewed and is accurate. I did check her thyroid levels today to see if it is appropriate for her to get trade drug Synthroid. Referral made to Dr. Chalmers Cater for help regarding hyperparathyroidism and thyroid disease. She will continue followup of her bipolar disease with her psychiatrist

## 2014-10-09 ENCOUNTER — Other Ambulatory Visit: Payer: Self-pay | Admitting: Radiology

## 2014-10-09 DIAGNOSIS — E0590 Thyrotoxicosis, unspecified without thyrotoxic crisis or storm: Secondary | ICD-10-CM

## 2014-10-09 LAB — T4, FREE: Free T4: 1.52 ng/dL (ref 0.80–1.80)

## 2014-10-09 LAB — TSH: TSH: 0.289 u[IU]/mL — ABNORMAL LOW (ref 0.350–4.500)

## 2014-10-14 DIAGNOSIS — F3181 Bipolar II disorder: Secondary | ICD-10-CM | POA: Diagnosis not present

## 2014-10-15 ENCOUNTER — Other Ambulatory Visit: Payer: Self-pay | Admitting: Gynecology

## 2014-10-15 DIAGNOSIS — N930 Postcoital and contact bleeding: Secondary | ICD-10-CM

## 2014-10-21 DIAGNOSIS — F3181 Bipolar II disorder: Secondary | ICD-10-CM | POA: Diagnosis not present

## 2014-10-21 DIAGNOSIS — E039 Hypothyroidism, unspecified: Secondary | ICD-10-CM | POA: Diagnosis not present

## 2014-10-21 DIAGNOSIS — M899 Disorder of bone, unspecified: Secondary | ICD-10-CM | POA: Diagnosis not present

## 2014-10-21 DIAGNOSIS — E213 Hyperparathyroidism, unspecified: Secondary | ICD-10-CM | POA: Diagnosis not present

## 2014-10-22 DIAGNOSIS — F3181 Bipolar II disorder: Secondary | ICD-10-CM | POA: Diagnosis not present

## 2014-10-28 ENCOUNTER — Encounter: Payer: Self-pay | Admitting: Gynecology

## 2014-10-28 DIAGNOSIS — F3181 Bipolar II disorder: Secondary | ICD-10-CM | POA: Diagnosis not present

## 2014-10-30 ENCOUNTER — Ambulatory Visit (INDEPENDENT_AMBULATORY_CARE_PROVIDER_SITE_OTHER): Payer: Medicare Other | Admitting: Gynecology

## 2014-10-30 ENCOUNTER — Ambulatory Visit (INDEPENDENT_AMBULATORY_CARE_PROVIDER_SITE_OTHER): Payer: Medicare Other

## 2014-10-30 ENCOUNTER — Other Ambulatory Visit: Payer: Self-pay | Admitting: Gynecology

## 2014-10-30 ENCOUNTER — Encounter: Payer: Self-pay | Admitting: Gynecology

## 2014-10-30 DIAGNOSIS — N711 Chronic inflammatory disease of uterus: Secondary | ICD-10-CM | POA: Diagnosis not present

## 2014-10-30 DIAGNOSIS — N930 Postcoital and contact bleeding: Secondary | ICD-10-CM | POA: Diagnosis not present

## 2014-10-30 DIAGNOSIS — N8331 Acquired atrophy of ovary: Secondary | ICD-10-CM

## 2014-10-30 DIAGNOSIS — N952 Postmenopausal atrophic vaginitis: Secondary | ICD-10-CM

## 2014-10-30 DIAGNOSIS — M8580 Other specified disorders of bone density and structure, unspecified site: Secondary | ICD-10-CM

## 2014-10-30 DIAGNOSIS — F3181 Bipolar II disorder: Secondary | ICD-10-CM | POA: Diagnosis not present

## 2014-10-30 DIAGNOSIS — N710 Acute inflammatory disease of uterus: Secondary | ICD-10-CM | POA: Diagnosis not present

## 2014-10-30 DIAGNOSIS — N83319 Acquired atrophy of ovary, unspecified side: Secondary | ICD-10-CM

## 2014-10-30 DIAGNOSIS — N951 Menopausal and female climacteric states: Secondary | ICD-10-CM

## 2014-10-30 MED ORDER — ESTRADIOL 0.5 MG PO TABS: 0.5000 mg | ORAL_TABLET | Freq: Every day | ORAL | Status: DC

## 2014-10-30 MED ORDER — PROGESTERONE MICRONIZED 100 MG PO CAPS: 100.0000 mg | ORAL_CAPSULE | Freq: Every day | ORAL | Status: DC

## 2014-10-30 NOTE — Patient Instructions (Signed)
Start on hormone replacement as we discussed. Call me if you have any issues or do any vaginal bleeding.

## 2014-10-30 NOTE — Progress Notes (Signed)
Anne Little 1961/01/22 [id number]        53 y.o.  [id number] presents for sonohysterogram due to postcoital bleeding which we think is due to introital trauma.  Past medical history,surgical history, problem list, medications, allergies, family history and social history were all reviewed and documented in the EPIC chart.  Directed ROS with pertinent positives and negatives documented in the history of present illness/assessment and plan.  Exam: [name] assistant General appearance:  Normal External BUS vagina normal with atrophic changes. Cervix normal  Ultrasound shows uterus normal size. Endometrial echo 1.8 mm. Right and left ovaries atrophic. Cul-de-sac negative.  Sonohysterogram performed, sterile technique, easy catheter introduction, good distention with no abnormalities. Endometrial sample with scant return. Patient tolerated well  Assessment/Plan:  53 y.o. [id number] with postcoital bleeding which I think is due to atrophic vaginal changes. Ultrasound shows no intracavitary abnormalities and thin endometrium.  Patient had actually talked to Dr. Chalmers Cater about HRT and she encouraged her to consider this.  I reviewed options to include vaginal estrogen versus global estrogen. Risks to include the WHI study with increased risk of stroke heart attack DVT and breast cancer discussed. Benefits as far as osteopenia, vaginal support and other symptom relief discussed. She's not having significant hot flashes or sleep disturbances. She really would like to try HRT. I discussed the various routes to include the advantage of transdermal to decrease the thrombosis risk. She would prefer a daily pill as she feels that she would be that her to remember this and really does not want to deal with the patch. Estradiol 0.5 mg and Prometrium 100 mg at bedtime prescribed with refill 1 year. Will follow up if needs dose adjustment but otherwise assuming she has a beneficial response them will follow expectantly.  She will follow up for the endometrial biopsy results from the sonohysterogram which I suspect will show atrophic or inadequate specimen.     Anastasio Auerbach MD, 11:29 AM 10/30/2014

## 2014-11-04 DIAGNOSIS — F3181 Bipolar II disorder: Secondary | ICD-10-CM | POA: Diagnosis not present

## 2014-11-07 DIAGNOSIS — F3181 Bipolar II disorder: Secondary | ICD-10-CM | POA: Diagnosis not present

## 2014-11-11 DIAGNOSIS — F3181 Bipolar II disorder: Secondary | ICD-10-CM | POA: Diagnosis not present

## 2014-11-13 DIAGNOSIS — F3181 Bipolar II disorder: Secondary | ICD-10-CM | POA: Diagnosis not present

## 2014-11-14 DIAGNOSIS — F3181 Bipolar II disorder: Secondary | ICD-10-CM | POA: Diagnosis not present

## 2014-11-18 DIAGNOSIS — F3181 Bipolar II disorder: Secondary | ICD-10-CM | POA: Diagnosis not present

## 2014-11-19 DIAGNOSIS — F3181 Bipolar II disorder: Secondary | ICD-10-CM | POA: Diagnosis not present

## 2014-11-20 DIAGNOSIS — F3181 Bipolar II disorder: Secondary | ICD-10-CM | POA: Diagnosis not present

## 2014-11-25 DIAGNOSIS — F3181 Bipolar II disorder: Secondary | ICD-10-CM | POA: Diagnosis not present

## 2014-11-26 DIAGNOSIS — F3181 Bipolar II disorder: Secondary | ICD-10-CM | POA: Diagnosis not present

## 2014-11-28 DIAGNOSIS — F3181 Bipolar II disorder: Secondary | ICD-10-CM | POA: Diagnosis not present

## 2014-12-02 DIAGNOSIS — F3181 Bipolar II disorder: Secondary | ICD-10-CM | POA: Diagnosis not present

## 2014-12-03 DIAGNOSIS — F3181 Bipolar II disorder: Secondary | ICD-10-CM | POA: Diagnosis not present

## 2014-12-05 DIAGNOSIS — F3181 Bipolar II disorder: Secondary | ICD-10-CM | POA: Diagnosis not present

## 2014-12-11 DIAGNOSIS — F3181 Bipolar II disorder: Secondary | ICD-10-CM | POA: Diagnosis not present

## 2014-12-12 DIAGNOSIS — F3181 Bipolar II disorder: Secondary | ICD-10-CM | POA: Diagnosis not present

## 2014-12-16 DIAGNOSIS — F3181 Bipolar II disorder: Secondary | ICD-10-CM | POA: Diagnosis not present

## 2014-12-18 DIAGNOSIS — F3181 Bipolar II disorder: Secondary | ICD-10-CM | POA: Diagnosis not present

## 2014-12-21 ENCOUNTER — Ambulatory Visit (INDEPENDENT_AMBULATORY_CARE_PROVIDER_SITE_OTHER): Payer: Medicare Other | Admitting: Emergency Medicine

## 2014-12-21 VITALS — BP 120/74 | HR 68 | Temp 98.6°F | Resp 18 | Ht 66.5 in | Wt 112.2 lb

## 2014-12-21 DIAGNOSIS — R05 Cough: Secondary | ICD-10-CM | POA: Diagnosis not present

## 2014-12-21 DIAGNOSIS — R059 Cough, unspecified: Secondary | ICD-10-CM

## 2014-12-21 DIAGNOSIS — J029 Acute pharyngitis, unspecified: Secondary | ICD-10-CM | POA: Diagnosis not present

## 2014-12-21 LAB — POCT CBC
Granulocyte percent: 58.9 %G (ref 37–80)
HCT, POC: 35.9 % — AB (ref 37.7–47.9)
Hemoglobin: 11.7 g/dL — AB (ref 12.2–16.2)
Lymph, poc: 1.3 (ref 0.6–3.4)
MCH, POC: 31.1 pg (ref 27–31.2)
MCHC: 32.6 g/dL (ref 31.8–35.4)
MCV: 95.3 fL (ref 80–97)
MID (cbc): 0.5 (ref 0–0.9)
MPV: 8.2 fL (ref 0–99.8)
POC Granulocyte: 2.7 (ref 2–6.9)
POC LYMPH PERCENT: 29.5 %L (ref 10–50)
POC MID %: 11.6 %M (ref 0–12)
Platelet Count, POC: 258 10*3/uL (ref 142–424)
RBC: 3.76 M/uL — AB (ref 4.04–5.48)
RDW, POC: 13.2 %
WBC: 4.5 10*3/uL — AB (ref 4.6–10.2)

## 2014-12-21 LAB — POCT INFLUENZA A/B
Influenza A, POC: NEGATIVE
Influenza B, POC: NEGATIVE

## 2014-12-21 MED ORDER — BENZONATATE 100 MG PO CAPS: 100.0000 mg | ORAL_CAPSULE | Freq: Three times a day (TID) | ORAL | Status: DC | PRN

## 2014-12-21 NOTE — Patient Instructions (Signed)
Cough, Adult  A cough is a reflex that helps clear your throat and airways. It can help heal the body or may be a reaction to an irritated airway. A cough may only last 2 or 3 weeks (acute) or may last more than 8 weeks (chronic).  CAUSES Acute cough:  Viral or bacterial infections. Chronic cough:  Infections.  Allergies.  Asthma.  Post-nasal drip.  Smoking.  Heartburn or acid reflux.  Some medicines.  Chronic lung problems (COPD).  Cancer. SYMPTOMS   Cough.  Fever.  Chest pain.  Increased breathing rate.  High-pitched whistling sound when breathing (wheezing).  Colored mucus that you cough up (sputum). TREATMENT   A bacterial cough may be treated with antibiotic medicine.  A viral cough must run its course and will not respond to antibiotics.  Your caregiver may recommend other treatments if you have a chronic cough. HOME CARE INSTRUCTIONS   Only take over-the-counter or prescription medicines for pain, discomfort, or fever as directed by your caregiver. Use cough suppressants only as directed by your caregiver.  Use a cold steam vaporizer or humidifier in your bedroom or home to help loosen secretions.  Sleep in a semi-upright position if your cough is worse at night.  Rest as needed.  Stop smoking if you smoke. SEEK IMMEDIATE MEDICAL CARE IF:   You have pus in your sputum.  Your cough starts to worsen.  You cannot control your cough with suppressants and are losing sleep.  You begin coughing up blood.  You have difficulty breathing.  You develop pain which is getting worse or is uncontrolled with medicine.  You have a fever. MAKE SURE YOU:   Understand these instructions.  Will watch your condition.  Will get help right away if you are not doing well or get worse. Document Released: 06/11/2011 Document Revised: 03/06/2012 Document Reviewed: 06/11/2011 ExitCare Patient Information 2015 [employer]. This information is not intended  to replace advice given to you by your health care provider. Make sure you discuss any questions you have with your health care provider.  

## 2014-12-21 NOTE — Progress Notes (Signed)
   Subjective:    Patient ID: Anne Little, female    DOB: 20-Mar-1961, 53 y.o.   MRN: [id number] This chart was scribed for [name], MD by Zola Button, Medical Scribe. This patient was seen in room 4 and the patient's care was started at 9:20 AM.   HPI HPI Comments: Anne Little is a 53 y.o. female who presents to the [facility] complaining of gradual onset URI symptoms that started about 1 week ago. She reports having HA, dry cough, general myalgias, sore throat, and fatigue. She has tried Mucinex for a few days and she also took her mother's cough medication one night. Anne Little had similar symptoms first; he was diagnosed with a common cold but he does not currently have symptoms anymore. Patient does not think she had a fever. She has had her flu shot this year. Patient denies smoking.  Review of Systems  Constitutional: Positive for fatigue. Negative for fever.  HENT: Positive for sore throat.   Respiratory: Positive for cough.   Musculoskeletal: Positive for myalgias.  Neurological: Positive for headaches.       Objective:   Physical Exam CONSTITUTIONAL: Well developed/well nourished. Not ill appearing. HEAD: Normocephalic/atraumatic EYES: EOM/PERRL ENMT: Mucous membranes moist. Rhinorrhea. NECK: supple no meningeal signs SPINE: entire spine nontender CV: S1/S2 noted, no murmurs/rubs/gallops noted LUNGS: Lungs are clear to auscultation bilaterally, no apparent distress ABDOMEN: soft, nontender, no rebound or guarding GU: no cva tenderness NEURO: Pt is awake/alert, moves all extremitiesx4 EXTREMITIES: pulses normal, full ROM SKIN: warm, color normal PSYCH: no abnormalities of mood noted Results for orders placed or performed in visit on 12/21/14  POCT CBC  Result Value Ref Range   WBC 4.5 (A) 4.6 - 10.2 K/uL   Lymph, poc 1.3 0.6 - 3.4   POC LYMPH PERCENT 29.5 10 - 50 %L   MID (cbc) 0.5 0 - 0.9   POC MID % 11.6 0 - 12 %M   POC Granulocyte 2.7 2 - 6.9    Granulocyte percent 58.9 37 - 80 %G   RBC 3.76 (A) 4.04 - 5.48 M/uL   Hemoglobin 11.7 (A) 12.2 - 16.2 g/dL   HCT, POC 35.9 (A) 37.7 - 47.9 %   MCV 95.3 80 - 97 fL   MCH, POC 31.1 27 - 31.2 pg   MCHC 32.6 31.8 - 35.4 g/dL   RDW, POC 13.2 %   Platelet Count, POC 258 142 - 424 K/uL   MPV 8.2 0 - 99.8 fL  POCT Influenza A/B  Result Value Ref Range   Influenza A, POC Negative    Influenza B, POC Negative          Assessment & Plan:  Patient looks good today. Her oxygen levels 100% shows a white count of 4500 consistent with a viral type illness her flu test is negative. Will treat with Tessalon Perles and mucinex.and she will return to clinic if any worsening. I personally performed the services described in this documentation, which was scribed in my presence. The recorded information has been reviewed and is accurate.

## 2014-12-23 DIAGNOSIS — F3181 Bipolar II disorder: Secondary | ICD-10-CM | POA: Diagnosis not present

## 2014-12-24 DIAGNOSIS — E039 Hypothyroidism, unspecified: Secondary | ICD-10-CM | POA: Diagnosis not present

## 2014-12-25 DIAGNOSIS — F3181 Bipolar II disorder: Secondary | ICD-10-CM | POA: Diagnosis not present

## 2014-12-30 DIAGNOSIS — F3181 Bipolar II disorder: Secondary | ICD-10-CM | POA: Diagnosis not present

## 2015-01-01 DIAGNOSIS — F3181 Bipolar II disorder: Secondary | ICD-10-CM | POA: Diagnosis not present

## 2015-01-02 DIAGNOSIS — F3181 Bipolar II disorder: Secondary | ICD-10-CM | POA: Diagnosis not present

## 2015-01-06 DIAGNOSIS — F3181 Bipolar II disorder: Secondary | ICD-10-CM | POA: Diagnosis not present

## 2015-01-09 DIAGNOSIS — F3181 Bipolar II disorder: Secondary | ICD-10-CM | POA: Diagnosis not present

## 2015-01-10 DIAGNOSIS — F3181 Bipolar II disorder: Secondary | ICD-10-CM | POA: Diagnosis not present

## 2015-01-13 DIAGNOSIS — F3181 Bipolar II disorder: Secondary | ICD-10-CM | POA: Diagnosis not present

## 2015-01-15 DIAGNOSIS — F3181 Bipolar II disorder: Secondary | ICD-10-CM | POA: Diagnosis not present

## 2015-01-16 DIAGNOSIS — F3181 Bipolar II disorder: Secondary | ICD-10-CM | POA: Diagnosis not present

## 2015-01-21 ENCOUNTER — Ambulatory Visit (INDEPENDENT_AMBULATORY_CARE_PROVIDER_SITE_OTHER): Payer: Medicare Other | Admitting: Emergency Medicine

## 2015-01-21 ENCOUNTER — Encounter: Payer: Self-pay | Admitting: Emergency Medicine

## 2015-01-21 VITALS — BP 125/82 | HR 66 | Temp 98.5°F | Resp 16 | Ht 67.0 in | Wt 115.0 lb

## 2015-01-21 DIAGNOSIS — M791 Myalgia, unspecified site: Secondary | ICD-10-CM

## 2015-01-21 DIAGNOSIS — R208 Other disturbances of skin sensation: Secondary | ICD-10-CM

## 2015-01-21 DIAGNOSIS — D509 Iron deficiency anemia, unspecified: Secondary | ICD-10-CM | POA: Diagnosis not present

## 2015-01-21 DIAGNOSIS — R5382 Chronic fatigue, unspecified: Secondary | ICD-10-CM

## 2015-01-21 DIAGNOSIS — R200 Anesthesia of skin: Secondary | ICD-10-CM

## 2015-01-21 NOTE — Progress Notes (Addendum)
Subjective:  This chart was scribed for Anne Russian, MD by Lowella Petties, ED Scribe. The patient was seen in room 21. Patient's care was started at 1:28 PM.  Patient ID: Anne Little, female    DOB: 1961/09/30, 54 y.o.   MRN: [id number]  HPI  HPI Comments: Anne Little is a 54 y.o. female who presents to the [facility] for a follow up about her anemia and bipolar disorder. She reports that her manic depression is much better on her current medications, but she reports crippling fatigue that forces her to lay down for the remainder of the day. She additionally reports occasional pins and needles in her hands and feet at night. She additionally expresses concern that she may not be able to build muscle normally. She expresses interested in undergoing a sleep study. She denies any recent manic spells, even after going down on her lithium. She states that she would like to go back to work, but is concerned about loosing her health benefits. She is agreeable to trying out some volunteering positions as she thinks about a potential career moving forward. She reports that she is moving soon and that this is a relatively stressful time, and for that reason she does not want to have a brain scan due to anxiety over the results. She does not want to have a nerve conduction study due to fear it will hurt.   Review of Systems  Constitutional: Positive for fatigue. Negative for fever and chills.  HENT: Negative for congestion, rhinorrhea and sore throat.   Respiratory: Negative for cough and shortness of breath.   Cardiovascular: Negative for chest pain and leg swelling.  Gastrointestinal: Negative for nausea, vomiting and diarrhea.  Genitourinary: Negative for dysuria and hematuria.  Musculoskeletal: Negative for back pain, arthralgias and neck pain.  Skin: Negative for rash and wound.  Neurological: Positive for weakness (loss of strength and difficulty building muscle in arms  bilaterallly) and numbness (pins and needles in hands and feet at night). Negative for headaches.  Psychiatric/Behavioral: Negative for confusion.    Objective:  Physical Exam  CONSTITUTIONAL: Well developed/well nourished HEAD: Normocephalic/atraumatic EYES: EOMI/PERRL ENMT: Mucous membranes moist NECK: supple no meningeal signs SPINE/BACK:entire spine nontender CV: S1/S2 noted, no murmurs/rubs/gallops noted LUNGS: Lungs are clear to auscultation bilaterally, no apparent distress ABDOMEN: soft, nontender, no rebound or guarding, bowel sounds noted throughout abdomen GU:no cva tenderness NEURO: Pt is awake/alert/appropriate, moves all extremitiesx4.  No facial droop.  There is normal filament testing of the feet. Vibratory sense and position sense of the lower extremities are both normal. EXTREMITIES: pulses normal/equal, full ROM SKIN: warm, color normal PSYCH: no abnormalities of mood noted, alert and oriented to situation   Assessment & Plan:   CBC, Y56, and folic acid for evaluation of her anemia. Her depression is doing very well, but she continues to have severe fatigue during the day. She complains of muscle weakness and discomfort with minimal exercise. Routine labs will be done today to include a L89, folic acid, and CK level. Will check a sleep study to evaluate her daytime fatigue. She is not interested in any imaging at this time. She is not interested in having an EMG or nerve conduction study for her hand and feet numbness at this time. There were minimal objective signs of neuropathy also check an acetylcholine receptor antibody test.I personally performed the services described in this documentation, which was scribed in my presence. The recorded information has been  reviewed and is accurate.

## 2015-01-22 ENCOUNTER — Other Ambulatory Visit: Payer: Self-pay | Admitting: Emergency Medicine

## 2015-01-22 DIAGNOSIS — D6489 Other specified anemias: Secondary | ICD-10-CM

## 2015-01-22 LAB — CBC WITH DIFFERENTIAL/PLATELET
Basophils Absolute: 0 10*3/uL (ref 0.0–0.1)
Basophils Relative: 1 % (ref 0–1)
Eosinophils Absolute: 0.3 10*3/uL (ref 0.0–0.7)
Eosinophils Relative: 6 % — ABNORMAL HIGH (ref 0–5)
HCT: 32.4 % — ABNORMAL LOW (ref 36.0–46.0)
Hemoglobin: 10.9 g/dL — ABNORMAL LOW (ref 12.0–15.0)
Lymphocytes Relative: 32 % (ref 12–46)
Lymphs Abs: 1.6 10*3/uL (ref 0.7–4.0)
MCH: 31.2 pg (ref 26.0–34.0)
MCHC: 33.6 g/dL (ref 30.0–36.0)
MCV: 92.8 fL (ref 78.0–100.0)
MPV: 11 fL (ref 8.6–12.4)
Monocytes Absolute: 0.4 10*3/uL (ref 0.1–1.0)
Monocytes Relative: 9 % (ref 3–12)
Neutro Abs: 2.5 10*3/uL (ref 1.7–7.7)
Neutrophils Relative %: 52 % (ref 43–77)
Platelets: 264 10*3/uL (ref 150–400)
RBC: 3.49 MIL/uL — ABNORMAL LOW (ref 3.87–5.11)
RDW: 12.8 % (ref 11.5–15.5)
WBC: 4.9 10*3/uL (ref 4.0–10.5)

## 2015-01-22 LAB — SEDIMENTATION RATE: Sed Rate: 6 mm/hr (ref 0–22)

## 2015-01-22 LAB — RETICULOCYTES
ABS Retic: 41.9 10*3/uL (ref 19.0–186.0)
RBC.: 3.49 MIL/uL — ABNORMAL LOW (ref 3.87–5.11)
Retic Ct Pct: 1.2 % (ref 0.4–2.3)

## 2015-01-22 LAB — VITAMIN B12: Vitamin B-12: 665 pg/mL (ref 211–911)

## 2015-01-22 LAB — FOLATE: Folate: >20 ng/mL

## 2015-01-22 LAB — CK: Total CK: 30 U/L (ref 7–177)

## 2015-01-23 DIAGNOSIS — F3181 Bipolar II disorder: Secondary | ICD-10-CM | POA: Diagnosis not present

## 2015-01-23 LAB — ACETYLCHOLINE RECEPTOR, BLOCKING: ACHR Blocking Abs: <15 % inhibit (ref <15)

## 2015-01-24 LAB — ACETYLCHOLINE RECEPTOR, BINDING: A CHR BINDING ABS: <0.3 nmol/L (ref <=0.30)

## 2015-01-27 DIAGNOSIS — F3181 Bipolar II disorder: Secondary | ICD-10-CM | POA: Diagnosis not present

## 2015-01-29 DIAGNOSIS — F3181 Bipolar II disorder: Secondary | ICD-10-CM | POA: Diagnosis not present

## 2015-01-29 LAB — ACETYLCHOLINE RECEPTOR, MODULATING: Acetylchol Modul Ab: 12 %

## 2015-01-30 DIAGNOSIS — F3181 Bipolar II disorder: Secondary | ICD-10-CM | POA: Diagnosis not present

## 2015-02-03 DIAGNOSIS — F3181 Bipolar II disorder: Secondary | ICD-10-CM | POA: Diagnosis not present

## 2015-02-05 DIAGNOSIS — F3181 Bipolar II disorder: Secondary | ICD-10-CM | POA: Diagnosis not present

## 2015-02-06 DIAGNOSIS — F3181 Bipolar II disorder: Secondary | ICD-10-CM | POA: Diagnosis not present

## 2015-02-10 DIAGNOSIS — F3181 Bipolar II disorder: Secondary | ICD-10-CM | POA: Diagnosis not present

## 2015-02-11 ENCOUNTER — Ambulatory Visit (INDEPENDENT_AMBULATORY_CARE_PROVIDER_SITE_OTHER): Payer: Medicare Other | Admitting: Neurology

## 2015-02-11 ENCOUNTER — Encounter: Payer: Self-pay | Admitting: Neurology

## 2015-02-11 VITALS — BP 115/77 | HR 71 | Resp 14 | Ht 66.0 in | Wt 113.0 lb

## 2015-02-11 DIAGNOSIS — G2581 Restless legs syndrome: Secondary | ICD-10-CM | POA: Diagnosis not present

## 2015-02-11 DIAGNOSIS — F4312 Post-traumatic stress disorder, chronic: Secondary | ICD-10-CM | POA: Diagnosis not present

## 2015-02-11 DIAGNOSIS — F329 Major depressive disorder, single episode, unspecified: Secondary | ICD-10-CM

## 2015-02-11 DIAGNOSIS — F3181 Bipolar II disorder: Secondary | ICD-10-CM | POA: Diagnosis not present

## 2015-02-11 DIAGNOSIS — R5383 Other fatigue: Secondary | ICD-10-CM | POA: Insufficient documentation

## 2015-02-11 DIAGNOSIS — F32A Depression, unspecified: Secondary | ICD-10-CM | POA: Insufficient documentation

## 2015-02-11 HISTORY — DX: Restless legs syndrome: G25.81

## 2015-02-11 HISTORY — DX: Post-traumatic stress disorder, chronic: F43.12

## 2015-02-11 NOTE — Patient Instructions (Signed)
Chronic Fatigue Syndrome Chronic fatigue syndrome (CFS) is a condition in which there is lasting, extreme tiredness (fatigue) that does not improve with rest. CFS affects women up to four times more often than men. If you have CFS, fatigue and other symptoms can make it hard for you to get through your day. There is no treatment or cure. You will need to work closely with your health care provider to come up with a treatment plan that works for you. CAUSES  No one knows what causes CFS. It may be triggered by a flu-like illness or by mono. Other triggers may include:  An abnormal immune system.  Low blood pressure.  Poor diet.  Physical or emotional stress. SIGNS AND SYMPTOMS The main symptom is fatigue that lasts all day, especially after physical or mental stress. Other common symptoms include:  An extreme loss of energy with no obvious cause.  Muscle or joint soreness.  Severe weakness.  Frequent headaches.  Fever.  Sore throat.  Swollen lymph glands.  Sleep is not refreshing.  Loss of concentration or memory. Less common symptoms may include:  Chills.  Night sweats.  Tingling or numbness.  Blurred vision.  Dizziness.  Sensitivity to noise or odors.  Mood swings.  Anxiety, panic attacks, and depression. Your symptoms may come and go, or you may have them all the time. DIAGNOSIS  There are no tests that can help health care providers diagnose CFS. It may take a long time for you to get a correct diagnosis. Your health care provider may need to do a number of tests to rule out other conditions that could be causing your symptoms. You may be diagnosed with CFS if:  You have fatigue that has lasted for at least six months.  Your fatigue is not relieved by rest.  Your fatigue is not caused by another condition.  Your fatigue is severe enough to interfere with work and daily activities.  You have at least four common symptoms of CFS. TREATMENT  There is no  cure for CFS at this time. The condition affects everyone differently. You will need to work with your health care provider to find the best treatment for your symptoms. Treatment may include:  Improving sleep with a regular bedtime routine.  Avoiding caffeine, alcohol, and tobacco.  Doing light exercise and stretching during the day.  Taking medicine to help you sleep.  Taking over-the-counter medicines to relieve joint or muscle pain.  Learning and practicing relaxation techniques.  Using memory aids or doing brain teasers to improve memory and concentration.  Seeing a mental health professional to evaluate and treat depression, if necessary.  Trying massage therapy, acupuncture, and movement exercises, like yoga or tai chi. HOME CARE INSTRUCTIONS Work closely with your health care provider to follow your treatment plan at home. You may need to make major lifestyle changes. If treatment does not seem to help, get a second opinion. You may get help from many health care providers, including doctors, mental health specialists, physical therapists, and rehabilitation therapists. Having the support of friends and loved ones is also important. SEEK MEDICAL CARE IF:  Your symptoms are not responding to treatment.  You are having strong feelings of anger, guilt, anxiety, or depression. Document Released: 01/20/2005 Document Revised: 04/29/2014 Document Reviewed: 11/02/2013 ExitCare Patient Information 2015 [employer]. This information is not intended to replace advice given to you by your health care provider. Make sure you discuss any questions you have with your health care provider.  
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
## 2015-02-12 ENCOUNTER — Telehealth: Payer: Self-pay | Admitting: Hematology & Oncology

## 2015-02-12 DIAGNOSIS — F3181 Bipolar II disorder: Secondary | ICD-10-CM | POA: Diagnosis not present

## 2015-02-12 NOTE — Telephone Encounter (Signed)
I spoke w NEW PATIENT today to remind them of their appointment with Dr. Ennever. Also, advised them to bring all medication bottles and insurance card information. ° °

## 2015-02-13 ENCOUNTER — Ambulatory Visit (HOSPITAL_BASED_OUTPATIENT_CLINIC_OR_DEPARTMENT_OTHER): Payer: Medicare Other | Admitting: Family

## 2015-02-13 ENCOUNTER — Other Ambulatory Visit (HOSPITAL_BASED_OUTPATIENT_CLINIC_OR_DEPARTMENT_OTHER): Payer: Medicare Other | Admitting: Lab

## 2015-02-13 ENCOUNTER — Ambulatory Visit: Payer: Medicare Other

## 2015-02-13 ENCOUNTER — Other Ambulatory Visit: Payer: Self-pay | Admitting: Family

## 2015-02-13 ENCOUNTER — Encounter: Payer: Self-pay | Admitting: Family

## 2015-02-13 VITALS — BP 134/64 | HR 65 | Temp 97.9°F | Resp 14 | Ht 66.0 in | Wt 113.0 lb

## 2015-02-13 DIAGNOSIS — D649 Anemia, unspecified: Secondary | ICD-10-CM

## 2015-02-13 DIAGNOSIS — Z803 Family history of malignant neoplasm of breast: Secondary | ICD-10-CM | POA: Diagnosis not present

## 2015-02-13 DIAGNOSIS — Z808 Family history of malignant neoplasm of other organs or systems: Secondary | ICD-10-CM | POA: Diagnosis not present

## 2015-02-13 DIAGNOSIS — E039 Hypothyroidism, unspecified: Secondary | ICD-10-CM

## 2015-02-13 DIAGNOSIS — F3181 Bipolar II disorder: Secondary | ICD-10-CM | POA: Diagnosis not present

## 2015-02-13 LAB — CMP (CANCER CENTER ONLY)
ALT(SGPT): 16 U/L (ref 10–47)
AST: 21 U/L (ref 11–38)
Albumin: 3.5 g/dL (ref 3.3–5.5)
Alkaline Phosphatase: 81 U/L (ref 26–84)
BUN, Bld: 11 mg/dL (ref 7–22)
CO2: 27 mEq/L (ref 18–33)
Calcium: 9.5 mg/dL (ref 8.0–10.3)
Chloride: 104 mEq/L (ref 98–108)
Creat: 1.2 mg/dl (ref 0.6–1.2)
Glucose, Bld: 94 mg/dL (ref 73–118)
Potassium: 4.1 mEq/L (ref 3.3–4.7)
Sodium: 144 mEq/L (ref 128–145)
Total Bilirubin: 0.6 mg/dl (ref 0.20–1.60)
Total Protein: 7 g/dL (ref 6.4–8.1)

## 2015-02-13 LAB — CBC WITH DIFFERENTIAL (CANCER CENTER ONLY)
BASO#: 0 10*3/uL (ref 0.0–0.2)
BASO%: 0.7 % (ref 0.0–2.0)
EOS%: 3.2 % (ref 0.0–7.0)
Eosinophils Absolute: 0.2 10*3/uL (ref 0.0–0.5)
HCT: 34 % — ABNORMAL LOW (ref 34.8–46.6)
HGB: 11.2 g/dL — ABNORMAL LOW (ref 11.6–15.9)
LYMPH#: 1.4 10*3/uL (ref 0.9–3.3)
LYMPH%: 25.5 % (ref 14.0–48.0)
MCH: 31 pg (ref 26.0–34.0)
MCHC: 32.9 g/dL (ref 32.0–36.0)
MCV: 94 fL (ref 81–101)
MONO#: 0.5 10*3/uL (ref 0.1–0.9)
MONO%: 9.4 % (ref 0.0–13.0)
NEUT#: 3.4 10*3/uL (ref 1.5–6.5)
NEUT%: 61.2 % (ref 39.6–80.0)
Platelets: 297 10*3/uL (ref 145–400)
RBC: 3.61 10*6/uL — ABNORMAL LOW (ref 3.70–5.32)
RDW: 11.9 % (ref 11.1–15.7)
WBC: 5.6 10*3/uL (ref 3.9–10.0)

## 2015-02-13 LAB — CHCC SATELLITE - SMEAR
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
## 2015-02-14 ENCOUNTER — Ambulatory Visit: Payer: Self-pay | Admitting: Endocrinology

## 2015-02-14 LAB — IRON AND TIBC CHCC
%SAT: 30 % (ref 21–57)
Iron: 79 ug/dL (ref 41–142)
TIBC: 268 ug/dL (ref 236–444)
UIBC: 188 ug/dL (ref 120–384)

## 2015-02-14 LAB — FERRITIN CHCC: Ferritin: 80 ng/ml (ref 9–269)

## 2015-02-17 DIAGNOSIS — F3181 Bipolar II disorder: Secondary | ICD-10-CM | POA: Diagnosis not present

## 2015-02-17 LAB — ERYTHROPOIETIN: Erythropoietin: 18.4 m[IU]/mL (ref 2.6–18.5)

## 2015-02-17 LAB — RETICULOCYTES (CHCC)
ABS Retic: 32.5 10*3/uL (ref 19.0–186.0)
RBC.: 3.61 MIL/uL — ABNORMAL LOW (ref 3.87–5.11)
Retic Ct Pct: 0.9 % (ref 0.4–2.3)

## 2015-02-19 DIAGNOSIS — F3181 Bipolar II disorder: Secondary | ICD-10-CM | POA: Diagnosis not present

## 2015-02-20 DIAGNOSIS — F3181 Bipolar II disorder: Secondary | ICD-10-CM | POA: Diagnosis not present

## 2015-02-24 DIAGNOSIS — F3181 Bipolar II disorder: Secondary | ICD-10-CM | POA: Diagnosis not present

## 2015-03-03 DIAGNOSIS — F3181 Bipolar II disorder: Secondary | ICD-10-CM | POA: Diagnosis not present

## 2015-03-05 DIAGNOSIS — F3181 Bipolar II disorder: Secondary | ICD-10-CM | POA: Diagnosis not present

## 2015-03-06 DIAGNOSIS — F3181 Bipolar II disorder: Secondary | ICD-10-CM | POA: Diagnosis not present

## 2015-03-10 ENCOUNTER — Telehealth: Payer: Self-pay | Admitting: Neurology

## 2015-03-10 DIAGNOSIS — F3181 Bipolar II disorder: Secondary | ICD-10-CM | POA: Diagnosis not present

## 2015-03-10 NOTE — Telephone Encounter (Signed)
Called and confirmed the appointment with patient for 03/11/15.

## 2015-03-11 ENCOUNTER — Ambulatory Visit (INDEPENDENT_AMBULATORY_CARE_PROVIDER_SITE_OTHER): Payer: Medicare Other | Admitting: Neurology

## 2015-03-11 DIAGNOSIS — G478 Other sleep disorders: Secondary | ICD-10-CM | POA: Diagnosis not present

## 2015-03-11 DIAGNOSIS — G2581 Restless legs syndrome: Secondary | ICD-10-CM

## 2015-03-11 DIAGNOSIS — G4752 REM sleep behavior disorder: Secondary | ICD-10-CM

## 2015-03-11 DIAGNOSIS — F3181 Bipolar II disorder: Secondary | ICD-10-CM | POA: Diagnosis not present

## 2015-03-11 DIAGNOSIS — F32A Depression, unspecified: Secondary | ICD-10-CM

## 2015-03-11 DIAGNOSIS — R5383 Other fatigue: Secondary | ICD-10-CM

## 2015-03-11 DIAGNOSIS — F329 Major depressive disorder, single episode, unspecified: Secondary | ICD-10-CM

## 2015-03-11 DIAGNOSIS — F4312 Post-traumatic stress disorder, chronic: Secondary | ICD-10-CM

## 2015-03-11 NOTE — Sleep Study (Signed)
Please see the scanned sleep study interpretation located in the Procedure tab within the Chart Review section. 

## 2015-03-12 DIAGNOSIS — E039 Hypothyroidism, unspecified: Secondary | ICD-10-CM | POA: Diagnosis not present

## 2015-03-12 DIAGNOSIS — F3181 Bipolar II disorder: Secondary | ICD-10-CM | POA: Diagnosis not present

## 2015-03-13 DIAGNOSIS — F3181 Bipolar II disorder: Secondary | ICD-10-CM | POA: Diagnosis not present

## 2015-03-13 NOTE — Sleep Study (Signed)
Please see the scanned sleep study interpretation located in the Procedure tab within the Chart Review section. 

## 2015-03-14 ENCOUNTER — Ambulatory Visit (INDEPENDENT_AMBULATORY_CARE_PROVIDER_SITE_OTHER): Payer: Medicare Other

## 2015-03-14 ENCOUNTER — Ambulatory Visit (INDEPENDENT_AMBULATORY_CARE_PROVIDER_SITE_OTHER): Payer: Medicare Other | Admitting: Emergency Medicine

## 2015-03-14 VITALS — BP 110/70 | HR 65 | Temp 98.1°F | Resp 16 | Ht 66.5 in | Wt 110.0 lb

## 2015-03-14 DIAGNOSIS — M79674 Pain in right toe(s): Secondary | ICD-10-CM

## 2015-03-14 DIAGNOSIS — M25551 Pain in right hip: Secondary | ICD-10-CM | POA: Diagnosis not present

## 2015-03-14 DIAGNOSIS — Z3200 Encounter for pregnancy test, result unknown: Secondary | ICD-10-CM | POA: Diagnosis not present

## 2015-03-14 LAB — POCT URINE PREGNANCY: Preg Test, Ur: NEGATIVE

## 2015-03-14 NOTE — Patient Instructions (Addendum)
Groin Strain A groin strain (also called a groin pull) is an injury to the muscles or tendon on the upper inner part of the thigh. These muscles are called the adductor muscles or groin muscles. They are responsible for moving the leg across the body. A muscle strain occurs when a muscle is overstretched and some muscle fibers are torn. A groin strain can range from mild to severe depending on how many muscle fibers are affected and whether the muscle fibers are partially or completely torn.  Groin strains usually occur during exercise or participation in sports. The injury often happens when a sudden, violent force is placed on a muscle, stretching the muscle too far. A strain is more likely to occur when your muscles are not warmed up or if you are not properly conditioned. Depending on the severity of the groin strain, recovery time may vary from a few weeks to several weeks. Severe injuries often require 4-6 weeks for recovery. In these cases, complete healing can take 4-5 months.  CAUSES   Stretching the groin muscles too far or too suddenly, often during side-to-side motion with an abrupt change in direction.  Putting repeated stress on the groin muscles over a long period of time.  Performing vigorous activity without properly stretching the groin muscles beforehand. SYMPTOMS   Pain and tenderness in the groin area. This begins as sharp pain and persists as a dull ache.  Popping or snapping feeling when the injury occurs (for severe strains).  Swelling or bruising.  Muscle spasms.  Weakness in the leg.  Stiffness in the groin area with decreased ability to move the affected muscles. DIAGNOSIS  Your caregiver will perform a physical exam to diagnose a groin strain. You will be asked about your symptoms and how the injury occurred. X-rays are sometimes needed to rule out a broken bone or cartilage problems. Your caregiver may order a CT scan or MRI if a complete muscle tear is  suspected. TREATMENT  A groin strain will often heal on its own. Your caregiver may prescribe medicines to help manage pain and swelling (anti-inflammatory medicine). You may be told to use crutches for the first few days to minimize your pain. HOME CARE INSTRUCTIONS   Rest. Do not use the strained muscle if it causes pain.  Put ice on the injured area.  Put ice in a plastic bag.  Place a towel between your skin and the bag.  Leave the ice on for 15-20 minutes, every 2-3 hours. Do this for the first 2 days after the injury.  Only take over-the-counter or prescription medicines as directed by your caregiver.  Wrap the injured area with an elastic bandage as directed by your caregiver.  Keep the injured leg raised (elevated).  Walk, stretch, and perform range-of-motion exercises to improve blood flow to the injured area. Only perform these activities if you can do so without any pain. To prevent muscle strains:  Warm up before exercise.  Develop proper conditioning and strength in the groin muscles. SEEK IMMEDIATE MEDICAL CARE IF:   You have increased pain or swelling in the affected area.   Your symptoms are not improving or are getting worse. MAKE SURE YOU:   Understand these instructions.  Will watch your condition.  Will get help right away if you are not doing well or get worse. Document Released: 08/10/2004 Document Revised: 11/29/2012 Document Reviewed: 08/16/2012 [facility] Patient Information 2015 [place], [place]. This information is not intended to replace advice given to you  by your health care provider. Make sure you discuss any questions you have with your health care provider. Toe Fracture Your caregiver has diagnosed you as having a fractured toe. A toe fracture is a break in the bone of a toe. "Buddy taping" is a way of splinting your broken toe, by taping the broken toe to the toe next to it. This "buddy taping" will keep the injured toe from moving beyond  normal range of motion. Buddy taping also helps the toe heal in a more normal alignment. It may take 6 to 8 weeks for the toe injury to heal. [place] your toes taped together for as long as directed by your caregiver or until you see a doctor for a follow-up examination. You can change the tape after bathing. Always use a small piece of gauze or cotton between the toes when taping them together. This will help the skin stay dry and prevent infection.  Apply ice to the injury for 15-20 minutes each hour while awake for the first 2 days. Put the ice in a plastic bag and place a towel between the bag of ice and your skin.  After the first 2 days, apply heat to the injured area. Use heat for the next 2 to 3 days. Place a heating pad on the foot or soak the foot in warm water as directed by your caregiver.  Keep your foot elevated as much as possible to lessen swelling.  Wear sturdy, supportive shoes. The shoes should not pinch the toes or fit tightly against the toes.  Your caregiver may prescribe a rigid shoe if your foot is very swollen.  Your may be given crutches if the pain is too great and it hurts too much to walk.  Only take over-the-counter or prescription medicines for pain, discomfort, or fever as directed by your caregiver.  If your caregiver has given you a follow-up appointment, it is very important to keep that appointment. Not keeping the appointment could result in a chronic or permanent injury, pain, and disability. If there is any problem keeping the appointment, you must call back to this facility for assistance. SEEK MEDICAL CARE IF:   You have increased pain or swelling, not relieved with medications.  The pain does not get better after 1 week.  Your injured toe is cold when the others are warm. SEEK IMMEDIATE MEDICAL CARE IF:   The toe becomes cold, numb, or white.  The toe becomes hot (inflamed) and red. Document Released: 12/10/2000 Document  Revised: 03/06/2012 Document Reviewed: 07/29/2008 [facility] Patient Information 2015 [place], [place]. This information is not intended to replace advice given to you by your health care provider. Make sure you discuss any questions you have with your health care provider.

## 2015-03-14 NOTE — Progress Notes (Deleted)
   Subjective:    Patient ID: Anne Little, female    DOB: 25-Apr-1961, 54 y.o.   MRN: [id number]  HPI    Review of Systems     Objective:   Physical Exam        Assessment & Plan:

## 2015-03-14 NOTE — Progress Notes (Deleted)
   Subjective:    Patient ID: Anne Little, female    DOB: [birth year], 54 y.o.   MRN: [id number] This note was written by Judithe Modest, a non-physician practitioner ]   ]]   HPI Pt states she has a groin pull three weeks ago. Pt reports she does not remember a sudden pain. Pt states she has pin in her second right toe. Pt denies back pain. Pt states she is exercising regularly. Pt states she moved recently and is doing a large amount of house work in her new home, and is getting a large amount of exercise.    Review of Systems  Constitutional: Negative for fever, chills, activity change, appetite change and unexpected weight change.  Respiratory: Negative for shortness of breath.   Cardiovascular: Negative for chest pain.  Skin: Negative for wound.       Objective:   Physical Exam  Constitutional: She is oriented to person, place, and time. She appears well-developed and well-nourished. No distress.  HENT:  Head: Normocephalic and atraumatic.  Right Ear: External ear normal.  Left Ear: External ear normal.  Mouth/Throat: Oropharynx is clear and moist.  Eyes: Conjunctivae and EOM are normal. Pupils are equal, round, and reactive to light.  Neck: Normal range of motion. Neck supple.  Cardiovascular: Normal rate, regular rhythm and normal heart sounds.   No murmur heard. Pulmonary/Chest: Effort normal and breath sounds normal. She has no wheezes.  Musculoskeletal: Normal range of motion. She exhibits edema and tenderness.  There is tenderness over the right groin area. There is no palpable hernia there is no tenderness in the inguinal canal. There is pain on external rotation of the hip. There is also swelling noted over the right second toe with decreased range of motion.  Neurological: She is alert and oriented to person, place, and time.  Skin: Skin is dry. No rash noted. She is not diaphoretic.  Psychiatric: She has a normal mood and affect. Her behavior is normal.       Assessment & Plan:    *** {add scribe attestation statement}

## 2015-03-14 NOTE — Progress Notes (Signed)
   Subjective:    Patient ID: Anne Little, female    DOB: [birth year], 54 y.o.   MRN: [id number] This chart was scribed for [name], MD by Marti Sleigh, Medical Scribe. This patient was seen in Room 11 and the patient's care was started at 10:02 AM.  HPI  HPI Comments: Anne Little is a 54 y.o. female with a hx of IBS, Bipolar disorder, and major depressive disorder who presents to [facility] complaining of a constant, mild right-sided abdominal pain that started six weeks ago after she moved some heavy objects. Pt reports she does not remember a sudden pain.  Pt also states she was walking through her house yesterday and jammed her right second toe. Pt denies back pain. Pt states she is exercising regularly. Pt states she moved recently and is doing a large amount of house work in her new home. Pt states she is currently menopausal and on estrogen replacement therapy.   Review of Systems  Constitutional: Negative for fever, chills, activity change, appetite change and unexpected weight change.  Respiratory: Negative for chest tightness and shortness of breath.   Cardiovascular: Negative for chest pain and palpitations.  Gastrointestinal: Positive for abdominal pain. Negative for constipation.  Musculoskeletal: Negative for back pain.  Skin: Negative for wound.       Objective:   Physical Exam  Constitutional: She is oriented to person, place, and time. She appears well-developed and well-nourished. No distress.  HENT:  Head: Normocephalic and atraumatic.  Eyes: Pupils are equal, round, and reactive to light.  Neck: Neck supple.  Cardiovascular: Normal rate.   Pulmonary/Chest: Effort normal. No respiratory distress.  Abdominal:  There is tenderness over the right groin area. There is no palpable hernia there is no tenderness in the inguinal canal. There is pain on external rotation of the hip. There is also swelling noted over the right second toe with decreased range of motion.     Musculoskeletal: Normal range of motion.  Neurological: She is alert and oriented to person, place, and time. Coordination normal.  Skin: Skin is warm and dry. She is not diaphoretic.  Psychiatric: She has a normal mood and affect. Her behavior is normal.  Nursing note and vitals reviewed. [facility] reading (PRIMARY) by  Dr. Everlene Farrier hip films appear normal to me. Examination of the proximal phalanx of the second toe reveals a possible linear fracture present which is nondisplaced      Assessment & Plan:  I did not feel a hernia on exam. I do feel she has a crack in the second toe. Will put her on exercises for the right hip. She can take Aleve for her discomfort. I also advised her to wear compression shorts. I personally performed the services described in this documentation, which was scribed in my presence. The recorded information has been reviewed and is accurate.

## 2015-03-18 DIAGNOSIS — F3181 Bipolar II disorder: Secondary | ICD-10-CM | POA: Diagnosis not present

## 2015-03-19 ENCOUNTER — Encounter: Payer: Self-pay | Admitting: Neurology

## 2015-03-19 ENCOUNTER — Telehealth: Payer: Self-pay | Admitting: *Deleted

## 2015-03-19 DIAGNOSIS — G4752 REM sleep behavior disorder: Secondary | ICD-10-CM | POA: Insufficient documentation

## 2015-03-19 NOTE — Telephone Encounter (Signed)
Patient was contacted and left a detailed message informing her that her sleep study revealed only mild PLMD and no significant sleep apnea.  She was informed that a follow up appointment in our office was optional and could be arranged by calling the office.  Dr. Everlene Farrier was routed a copy of the report.

## 2015-03-20 DIAGNOSIS — F3181 Bipolar II disorder: Secondary | ICD-10-CM | POA: Diagnosis not present

## 2015-03-24 DIAGNOSIS — F3181 Bipolar II disorder: Secondary | ICD-10-CM | POA: Diagnosis not present

## 2015-03-26 DIAGNOSIS — F3181 Bipolar II disorder: Secondary | ICD-10-CM | POA: Diagnosis not present

## 2015-03-27 DIAGNOSIS — F3181 Bipolar II disorder: Secondary | ICD-10-CM | POA: Diagnosis not present

## 2015-03-31 DIAGNOSIS — F3181 Bipolar II disorder: Secondary | ICD-10-CM | POA: Diagnosis not present

## 2015-04-01 DIAGNOSIS — F3181 Bipolar II disorder: Secondary | ICD-10-CM | POA: Diagnosis not present

## 2015-04-03 DIAGNOSIS — F3181 Bipolar II disorder: Secondary | ICD-10-CM | POA: Diagnosis not present

## 2015-04-07 DIAGNOSIS — F3181 Bipolar II disorder: Secondary | ICD-10-CM | POA: Diagnosis not present

## 2015-04-08 DIAGNOSIS — F3181 Bipolar II disorder: Secondary | ICD-10-CM | POA: Diagnosis not present

## 2015-04-10 DIAGNOSIS — F3181 Bipolar II disorder: Secondary | ICD-10-CM | POA: Diagnosis not present

## 2015-04-15 DIAGNOSIS — E039 Hypothyroidism, unspecified: Secondary | ICD-10-CM | POA: Diagnosis not present

## 2015-04-17 DIAGNOSIS — F3181 Bipolar II disorder: Secondary | ICD-10-CM | POA: Diagnosis not present

## 2015-04-21 DIAGNOSIS — F3181 Bipolar II disorder: Secondary | ICD-10-CM | POA: Diagnosis not present

## 2015-04-23 DIAGNOSIS — F3181 Bipolar II disorder: Secondary | ICD-10-CM | POA: Diagnosis not present

## 2015-04-24 DIAGNOSIS — E039 Hypothyroidism, unspecified: Secondary | ICD-10-CM | POA: Diagnosis not present

## 2015-04-24 DIAGNOSIS — F3181 Bipolar II disorder: Secondary | ICD-10-CM | POA: Diagnosis not present

## 2015-04-28 DIAGNOSIS — F3181 Bipolar II disorder: Secondary | ICD-10-CM | POA: Diagnosis not present

## 2015-04-30 DIAGNOSIS — F3181 Bipolar II disorder: Secondary | ICD-10-CM | POA: Diagnosis not present

## 2015-05-01 DIAGNOSIS — F3181 Bipolar II disorder: Secondary | ICD-10-CM | POA: Diagnosis not present

## 2015-05-05 DIAGNOSIS — F3181 Bipolar II disorder: Secondary | ICD-10-CM | POA: Diagnosis not present

## 2015-05-08 DIAGNOSIS — F3181 Bipolar II disorder: Secondary | ICD-10-CM | POA: Diagnosis not present

## 2015-05-12 ENCOUNTER — Ambulatory Visit (INDEPENDENT_AMBULATORY_CARE_PROVIDER_SITE_OTHER): Payer: Medicare Other | Admitting: Emergency Medicine

## 2015-05-12 ENCOUNTER — Ambulatory Visit (INDEPENDENT_AMBULATORY_CARE_PROVIDER_SITE_OTHER): Payer: Medicare Other

## 2015-05-12 VITALS — BP 106/64 | HR 72 | Temp 98.2°F | Resp 16 | Ht 67.0 in | Wt 109.0 lb

## 2015-05-12 DIAGNOSIS — R059 Cough, unspecified: Secondary | ICD-10-CM

## 2015-05-12 DIAGNOSIS — R05 Cough: Secondary | ICD-10-CM

## 2015-05-12 DIAGNOSIS — F3181 Bipolar II disorder: Secondary | ICD-10-CM | POA: Diagnosis not present

## 2015-05-12 DIAGNOSIS — J209 Acute bronchitis, unspecified: Secondary | ICD-10-CM

## 2015-05-12 MED ORDER — ALBUTEROL SULFATE HFA 108 (90 BASE) MCG/ACT IN AERS: 2.0000 | INHALATION_SPRAY | RESPIRATORY_TRACT | Status: DC | PRN

## 2015-05-12 MED ORDER — FEXOFENADINE HCL 180 MG PO TABS: 180.0000 mg | ORAL_TABLET | Freq: Every day | ORAL | Status: DC

## 2015-05-12 NOTE — Patient Instructions (Signed)
Metered Dose Inhaler (No Spacer Used)  Inhaled medicines are the basis of treatment for asthma and other breathing problems. Inhaled medicine can only be effective if used properly. Good technique assures that the medicine reaches the lungs.  Metered dose inhalers (MDIs) are used to deliver a variety of inhaled medicines. These include quick relief or rescue medicines (such as bronchodilators) and controller medicines (such as corticosteroids). The medicine is delivered by pushing down on a metal canister to release a set amount of spray.  If you are using different kinds of inhalers, use your quick relief medicine to open the airways 10-15 minutes before using a steroid, if instructed to do so by your health care provider. If you are unsure which inhalers to use and the order of using them, ask your health care provider, nurse, or respiratory therapist.  HOW TO USE THE INHALER  1. Remove the cap from the inhaler.  2. If you are using the inhaler for the first time, you will need to prime it. Shake the inhaler for 5 seconds and release four puffs into the air, away from your face. Ask your health care provider or pharmacist if you have questions about priming your inhaler.  3. Shake the inhaler for 5 seconds before each breath in (inhalation).  4. Position the inhaler so that the top of the canister faces up.  5. Put your index finger on the top of the medicine canister. Your thumb supports the bottom of the inhaler.  6. Open your mouth.  7. Either place the inhaler between your teeth and place your lips tightly around the mouthpiece, or hold the inhaler 1-2 inches away from your open mouth. If you are unsure of which technique to use, ask your health care provider.  8. Breathe out (exhale) normally and as completely as possible.  9. Press the canister down with the index finger to release the medicine.  10. At the same time as the canister is pressed, inhale deeply and slowly until your lungs are completely filled.  This should take 4-6 seconds. Keep your tongue down.  11. Hold the medicine in your lungs for 5-10 seconds (10 seconds is best). This helps the medicine get into the small airways of your lungs.  12. Breathe out slowly, through pursed lips. Whistling is an example of pursed lips.  13. Wait at least 1 minute between puffs. Continue with the above steps until you have taken the number of puffs your health care provider has ordered. Do not use the inhaler more than your health care provider directs you to.  14. Replace the cap on the inhaler.  15. Follow the directions from your health care provider or the inhaler insert for cleaning the inhaler.  If you are using a steroid inhaler, after your last puff, rinse your mouth with water, gargle, and spit out the water. Do not swallow the water.  AVOID:  · Inhaling before or after starting the spray of medicine. It takes practice to coordinate your breathing with triggering the spray.  · Inhaling through the nose (rather than the mouth) when triggering the spray.  HOW TO DETERMINE IF YOUR INHALER IS FULL OR NEARLY EMPTY  You cannot know when an inhaler is empty by shaking it. Some inhalers are now being made with dose counters. Ask your health care provider for a prescription that has a dose counter if you feel you need that extra help. If your inhaler does not have a counter, ask your health care   provider to help you determine the date you need to refill your inhaler. Write the refill date on a calendar or your inhaler canister. Refill your inhaler 7-10 days before it runs out. Be sure to keep an adequate supply of medicine. This includes making sure it has not expired, and making sure you have a spare inhaler.  SEEK MEDICAL CARE IF:  · Symptoms are only partially relieved with your inhaler.  · You are having trouble using your inhaler.  · You experience an increase in phlegm.  SEEK IMMEDIATE MEDICAL CARE IF:  · You feel little or no relief with your inhalers. You are still  wheezing and feeling shortness of breath, tightness in your chest, or both.  · You have dizziness, headaches, or a fast heart rate.  · You have chills, fever, or night sweats.  · There is a noticeable increase in phlegm production, or there is blood in the phlegm.  MAKE SURE YOU:  · Understand these instructions.  · Will watch your condition.  · Will get help right away if you are not doing well or get worse.  Document Released: 10/10/2007 Document Revised: 04/29/2014 Document Reviewed: 05/31/2013  [employer]® Patient Information ©2015 [employer]. This information is not intended to replace advice given to you by your health care provider. Make sure you discuss any questions you have with your health care provider.

## 2015-05-12 NOTE — Progress Notes (Signed)
[facility] [address] [place] [address] [phone]  Date:  05/12/2015   Name:  Anne Little   DOB:  [birth year]   MRN:  [id number]  PCP:  Jenny Reichmann, MD    Chief Complaint: Cough and Toe Pain   History of Present Illness:  Anne Little is a 54 y.o. very pleasant female patient who presents with the following:  Has a cough for the past month. Usually non productive.  Occasionally mucoid sputum Patient denies fever or chills. Patient denies nausea or vomiting Says she is having her house renovated and there is a lot of dust. Some watery nasal discharge and congestion No improvement with over the counter medications or other home remedies.  Denies other complaint or health concern today.   Patient Active Problem List   Diagnosis Date Noted  . Sleep behavior disorder, REM 03/19/2015  . Anemia 02/13/2015  . RLS (restless legs syndrome) 02/11/2015  . Chronic post-traumatic stress disorder (PTSD) 02/11/2015  . Fatigue due to depression 02/11/2015  . Hypercalcemia 08/14/2014  . Menopausal state 08/14/2014  . Major depression, recurrent, chronic 01/14/2014  . BIPOLAR DISORDER UNSPECIFIED 01/13/2008  . INTERNAL HEMORRHOIDS 01/13/2008  . CONSTIPATION 01/13/2008  . IRRITABLE BOWEL SYNDROME 01/13/2008  . RECTAL BLEEDING 01/13/2008    Past Medical History  Diagnosis Date  . Depression   . Bipolar 1 disorder   . Hypothyroidism   . IBS (irritable bowel syndrome)   . Colon polyps   . Anxiety   . GERD (gastroesophageal reflux disease)   . Hyperlipidemia   . Thyroid disease   . Kidney stones   . Hepatitis   . PTSD (post-traumatic stress disorder)   . Hx of adenomatous colonic polyps   . Allergy   . Anemia   . Parathyroid disease   . Endometriosis   . Osteopenia   . RLS (restless legs syndrome) 02/11/2015  . Chronic post-traumatic stress disorder (PTSD) 02/11/2015    Past Surgical History  Procedure Laterality Date  . Appendectomy    .  Other surgical history  1980    endometriosis/ectopic pregnancy  . Colonoscopy  2013    [facility]    History  Substance Use Topics  . Smoking status: Former Smoker    Quit date: 12/27/1986  . Smokeless tobacco: Never Used     Comment: quit smoking 26 years ago  . Alcohol Use: 0.0 oz/week    0 Standard drinks or equivalent per week     Comment: occ    Family History  Problem Relation Age of Onset  . Colon cancer      great aun  . Colon cancer Maternal Aunt   . Colon cancer Cousin   . Hyperlipidemia Mother   . Cancer Father     Bladder  . Hyperlipidemia Father   . Hypertension Father   . Hyperlipidemia Sister   . Hyperlipidemia Maternal Grandmother   . Hypertension Maternal Grandmother   . Stroke Maternal Grandmother   . Mental illness Maternal Grandfather   . Alcoholism Maternal Grandfather   . Heart disease Paternal Grandmother   . Hyperlipidemia Paternal Grandmother   . Hypertension Paternal Grandmother   . Stroke Paternal Grandmother   . Cancer Paternal Grandfather     Colon?    Allergies  Allergen Reactions  . Benzodiazepines Other (See Comments)    paradoxical effect   . Erythromycin Other (See Comments)    vomiting  . Macrolides And Ketolides Other (See Comments)  .  Neomycin Swelling    "topical mycins"  . Penicillins Hives    Pt unsure if allergy, due to having seen an horrific sight and hives could be anxiety related.    . Trazodone And Nefazodone     Per pt she experiences a "paradoxical" reaction and shakiness with Trazodone in the past.   . Lamictal [Lamotrigine] Rash    Medication list has been reviewed and updated.  Current Outpatient Prescriptions on File Prior to Visit  Medication Sig Dispense Refill  . buPROPion (WELLBUTRIN XL) 300 MG 24 hr tablet Take 300 mg by mouth daily.   0  . estradiol (ESTRACE) 0.5 MG tablet Take 1 tablet (0.5 mg total) by mouth daily. 30 tablet 11  . gabapentin (NEURONTIN) 300 MG capsule Take 3 capsules (900 mg total)  by mouth at bedtime. agitation 90 capsule 0  . lithium carbonate 300 MG capsule Take 300 mg by mouth daily. Take 1 cap in the evening    . propranolol (INDERAL) 10 MG tablet Take 10 mg by mouth 2 (two) times daily. Anxiety/high blood pressure    . SYNTHROID 75 MCG tablet Take 1 tablet (75 mcg total) by mouth daily before breakfast. (Patient taking differently: Take 75 mcg by mouth daily before breakfast. 50 mcg is 4 days a week  And 75 mcg is 3 days a week) 90 tablet 0   No current facility-administered medications on file prior to visit.    Review of Systems:  Review of Systems  Constitutional: Negative for fever, chills and fatigue.  HENT: Negative for congestion, ear pain, hearing loss, postnasal drip, rhinorrhea and sinus pressure.   Eyes: Negative for discharge and redness.  Respiratory: Negative for cough, shortness of breath and wheezing.   Cardiovascular: Negative for chest pain and leg swelling.  Gastrointestinal: Negative for nausea, vomiting, abdominal pain, constipation and blood in stool.  Genitourinary: Negative for dysuria, urgency and frequency.  Musculoskeletal: Negative for neck stiffness.  Skin: Negative for rash.  Neurological: Negative for seizures, weakness and headaches.   Physical Examination: Filed Vitals:   05/12/15 1629  BP: 106/64  Pulse: 72  Temp: 98.2 F (36.8 C)  Resp: 16   Filed Vitals:   05/12/15 1629  Height: 5\' 7"  (1.702 m)  Weight: 109 lb (49.442 kg)   Body mass index is 17.07 kg/(m^2). Ideal Body Weight: Weight in (lb) to have BMI = 25: 159.3  GEN: WDWN, NAD, Non-toxic, A & O x 3 HEENT: Atraumatic, Normocephalic. Neck supple. No masses, No LAD. Ears and Nose: No external deformity. CV: RRR, No M/G/R. No JVD. No thrill. No extra heart sounds. PULM: CTA B, no wheezes, crackles, rhonchi. No retractions. No resp. distress. No accessory muscle use. ABD: S, NT, ND, +BS. No rebound. No HSM. EXTR: No c/c/e NEURO Normal gait.  PSYCH: Normally  interactive. Conversant. Not depressed or anxious appearing.  Calm demeanor.    Assessment and Plan: 1. Cough  - DG Chest 2 View; Future  2. Acute bronchitis, unspecified organism Likely allergic Albuterol     Signed Ellison Carwin, MD  [facility] reading (PRIMARY) by  Dr. Ouida Sills.  negative.

## 2015-05-15 DIAGNOSIS — F3181 Bipolar II disorder: Secondary | ICD-10-CM | POA: Diagnosis not present

## 2015-05-19 DIAGNOSIS — F3181 Bipolar II disorder: Secondary | ICD-10-CM | POA: Diagnosis not present

## 2015-05-22 DIAGNOSIS — F3181 Bipolar II disorder: Secondary | ICD-10-CM | POA: Diagnosis not present

## 2015-05-29 DIAGNOSIS — F3181 Bipolar II disorder: Secondary | ICD-10-CM | POA: Diagnosis not present

## 2015-06-02 DIAGNOSIS — F3181 Bipolar II disorder: Secondary | ICD-10-CM | POA: Diagnosis not present

## 2015-06-03 ENCOUNTER — Encounter: Payer: Self-pay | Admitting: *Deleted

## 2015-06-09 DIAGNOSIS — F3181 Bipolar II disorder: Secondary | ICD-10-CM | POA: Diagnosis not present

## 2015-06-16 DIAGNOSIS — F3181 Bipolar II disorder: Secondary | ICD-10-CM | POA: Diagnosis not present

## 2015-06-19 DIAGNOSIS — F3181 Bipolar II disorder: Secondary | ICD-10-CM | POA: Diagnosis not present

## 2015-06-23 DIAGNOSIS — F3181 Bipolar II disorder: Secondary | ICD-10-CM | POA: Diagnosis not present

## 2015-07-02 DIAGNOSIS — F3181 Bipolar II disorder: Secondary | ICD-10-CM | POA: Diagnosis not present

## 2015-07-03 DIAGNOSIS — F3181 Bipolar II disorder: Secondary | ICD-10-CM | POA: Diagnosis not present

## 2015-07-21 DIAGNOSIS — F3181 Bipolar II disorder: Secondary | ICD-10-CM | POA: Diagnosis not present

## 2015-07-23 DIAGNOSIS — E039 Hypothyroidism, unspecified: Secondary | ICD-10-CM | POA: Diagnosis not present

## 2015-07-28 DIAGNOSIS — F3181 Bipolar II disorder: Secondary | ICD-10-CM | POA: Diagnosis not present

## 2015-07-31 DIAGNOSIS — F3181 Bipolar II disorder: Secondary | ICD-10-CM | POA: Diagnosis not present

## 2015-08-04 DIAGNOSIS — F3181 Bipolar II disorder: Secondary | ICD-10-CM | POA: Diagnosis not present

## 2015-08-07 DIAGNOSIS — F3181 Bipolar II disorder: Secondary | ICD-10-CM | POA: Diagnosis not present

## 2015-08-11 DIAGNOSIS — F3181 Bipolar II disorder: Secondary | ICD-10-CM | POA: Diagnosis not present

## 2015-08-14 DIAGNOSIS — F3181 Bipolar II disorder: Secondary | ICD-10-CM | POA: Diagnosis not present

## 2015-08-18 DIAGNOSIS — F3181 Bipolar II disorder: Secondary | ICD-10-CM | POA: Diagnosis not present

## 2015-08-21 DIAGNOSIS — F3181 Bipolar II disorder: Secondary | ICD-10-CM | POA: Diagnosis not present

## 2015-08-25 DIAGNOSIS — F3181 Bipolar II disorder: Secondary | ICD-10-CM | POA: Diagnosis not present

## 2015-08-28 DIAGNOSIS — F3181 Bipolar II disorder: Secondary | ICD-10-CM | POA: Diagnosis not present

## 2015-09-04 DIAGNOSIS — F3181 Bipolar II disorder: Secondary | ICD-10-CM | POA: Diagnosis not present

## 2015-09-07 ENCOUNTER — Encounter (HOSPITAL_COMMUNITY): Payer: Self-pay | Admitting: Nurse Practitioner

## 2015-09-07 ENCOUNTER — Emergency Department (HOSPITAL_COMMUNITY)
Admission: EM | Admit: 2015-09-07 | Discharge: 2015-09-07 | Disposition: A | Discharge status: Home / Self Care | Payer: Medicare Other | Attending: Emergency Medicine | Admitting: Emergency Medicine

## 2015-09-07 DIAGNOSIS — Z86010 Personal history of colonic polyps: Secondary | ICD-10-CM | POA: Diagnosis not present

## 2015-09-07 DIAGNOSIS — S0591XA Unspecified injury of right eye and orbit, initial encounter: Secondary | ICD-10-CM | POA: Insufficient documentation

## 2015-09-07 DIAGNOSIS — Z79899 Other long term (current) drug therapy: Secondary | ICD-10-CM | POA: Diagnosis not present

## 2015-09-07 DIAGNOSIS — Y998 Other external cause status: Secondary | ICD-10-CM | POA: Diagnosis not present

## 2015-09-07 DIAGNOSIS — K219 Gastro-esophageal reflux disease without esophagitis: Secondary | ICD-10-CM | POA: Diagnosis not present

## 2015-09-07 DIAGNOSIS — F319 Bipolar disorder, unspecified: Secondary | ICD-10-CM | POA: Diagnosis not present

## 2015-09-07 DIAGNOSIS — E039 Hypothyroidism, unspecified: Secondary | ICD-10-CM | POA: Insufficient documentation

## 2015-09-07 DIAGNOSIS — Z87891 Personal history of nicotine dependence: Secondary | ICD-10-CM | POA: Insufficient documentation

## 2015-09-07 DIAGNOSIS — X58XXXA Exposure to other specified factors, initial encounter: Secondary | ICD-10-CM | POA: Insufficient documentation

## 2015-09-07 DIAGNOSIS — E785 Hyperlipidemia, unspecified: Secondary | ICD-10-CM | POA: Insufficient documentation

## 2015-09-07 DIAGNOSIS — Y9389 Activity, other specified: Secondary | ICD-10-CM | POA: Insufficient documentation

## 2015-09-07 DIAGNOSIS — G8929 Other chronic pain: Secondary | ICD-10-CM | POA: Insufficient documentation

## 2015-09-07 DIAGNOSIS — Y9289 Other specified places as the place of occurrence of the external cause: Secondary | ICD-10-CM | POA: Insufficient documentation

## 2015-09-07 DIAGNOSIS — Z862 Personal history of diseases of the blood and blood-forming organs and certain disorders involving the immune mechanism: Secondary | ICD-10-CM | POA: Diagnosis not present

## 2015-09-07 DIAGNOSIS — Z87442 Personal history of urinary calculi: Secondary | ICD-10-CM | POA: Diagnosis not present

## 2015-09-07 DIAGNOSIS — F419 Anxiety disorder, unspecified: Secondary | ICD-10-CM | POA: Insufficient documentation

## 2015-09-07 DIAGNOSIS — Z880 Allergy status to penicillin: Secondary | ICD-10-CM | POA: Insufficient documentation

## 2015-09-07 MED ORDER — TETRACAINE HCL 0.5 % OP SOLN
1.0000 [drp] | Freq: Once | OPHTHALMIC | Status: AC
  Administered 2015-09-07: 1 [drp] via OPHTHALMIC
  Filled 2015-09-07: qty 2

## 2015-09-07 MED ORDER — FLUORESCEIN SODIUM 1 MG OP STRP
1.0000 | ORAL_STRIP | Freq: Once | OPHTHALMIC | Status: AC
  Administered 2015-09-07: 1 via OPHTHALMIC
  Filled 2015-09-07: qty 1
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
## 2015-09-07 NOTE — Discharge Instructions (Signed)
°  Your eye exam is reassuring today.  If you have any of the following please return to the ER.  -Loss of vision -Flashing lights -Severe pain -Fever -Purulent discharge  If your symptoms have not resolved by tomorrow afternoon, please call the eye doctor for a follow-up appointment.

## 2015-09-08 DIAGNOSIS — F3181 Bipolar II disorder: Secondary | ICD-10-CM | POA: Diagnosis not present

## 2015-09-09 DIAGNOSIS — H524 Presbyopia: Secondary | ICD-10-CM | POA: Diagnosis not present

## 2015-09-09 DIAGNOSIS — H2511 Age-related nuclear cataract, right eye: Secondary | ICD-10-CM | POA: Diagnosis not present

## 2015-09-09 DIAGNOSIS — H16222 Keratoconjunctivitis sicca, not specified as Sjogren's, left eye: Secondary | ICD-10-CM | POA: Diagnosis not present

## 2015-09-09 DIAGNOSIS — H16221 Keratoconjunctivitis sicca, not specified as Sjogren's, right eye: Secondary | ICD-10-CM | POA: Diagnosis not present

## 2015-09-11 DIAGNOSIS — F3181 Bipolar II disorder: Secondary | ICD-10-CM | POA: Diagnosis not present

## 2015-09-15 DIAGNOSIS — F3181 Bipolar II disorder: Secondary | ICD-10-CM | POA: Diagnosis not present

## 2015-09-18 DIAGNOSIS — F3181 Bipolar II disorder: Secondary | ICD-10-CM | POA: Diagnosis not present

## 2015-09-22 DIAGNOSIS — F3181 Bipolar II disorder: Secondary | ICD-10-CM | POA: Diagnosis not present

## 2015-09-24 ENCOUNTER — Telehealth: Payer: Self-pay

## 2015-09-24 DIAGNOSIS — F3181 Bipolar II disorder: Secondary | ICD-10-CM | POA: Diagnosis not present

## 2015-09-24 NOTE — Telephone Encounter (Signed)
Called patient to discuss past due mammogram.  Patient stated she would call today to make appointment.

## 2015-09-25 DIAGNOSIS — F3181 Bipolar II disorder: Secondary | ICD-10-CM | POA: Diagnosis not present

## 2015-09-29 DIAGNOSIS — F3181 Bipolar II disorder: Secondary | ICD-10-CM | POA: Diagnosis not present

## 2015-09-30 ENCOUNTER — Telehealth: Payer: Self-pay | Admitting: Internal Medicine

## 2015-09-30 ENCOUNTER — Encounter: Payer: Self-pay | Admitting: Emergency Medicine

## 2015-09-30 NOTE — Telephone Encounter (Signed)
Patient is asking when next colonoscopy is due. Former [employer] patient. DOD- Dr. Havery Moros. Please, advise.

## 2015-09-30 NOTE — Telephone Encounter (Signed)
I reviewed her chart. She had a colonoscopy at [facility] in 2013 showing one small cecal adenoma. Based on this report she should be due for a surveillance colonoscopy in 2018, unless she is having symptoms that would warrant a procedure sooner.

## 2015-10-01 DIAGNOSIS — F3181 Bipolar II disorder: Secondary | ICD-10-CM | POA: Diagnosis not present

## 2015-10-01 NOTE — Telephone Encounter (Signed)
Left a message for patient to call back. 

## 2015-10-02 DIAGNOSIS — F3181 Bipolar II disorder: Secondary | ICD-10-CM | POA: Diagnosis not present

## 2015-10-03 ENCOUNTER — Ambulatory Visit (INDEPENDENT_AMBULATORY_CARE_PROVIDER_SITE_OTHER): Payer: Medicare Other

## 2015-10-03 ENCOUNTER — Ambulatory Visit (INDEPENDENT_AMBULATORY_CARE_PROVIDER_SITE_OTHER): Payer: Medicare Other | Admitting: Emergency Medicine

## 2015-10-03 DIAGNOSIS — S8000XA Contusion of unspecified knee, initial encounter: Secondary | ICD-10-CM | POA: Diagnosis not present

## 2015-10-03 DIAGNOSIS — R0602 Shortness of breath: Secondary | ICD-10-CM

## 2015-10-03 DIAGNOSIS — S8001XA Contusion of right knee, initial encounter: Secondary | ICD-10-CM

## 2015-10-03 DIAGNOSIS — M25562 Pain in left knee: Secondary | ICD-10-CM | POA: Diagnosis not present

## 2015-10-03 DIAGNOSIS — M542 Cervicalgia: Secondary | ICD-10-CM | POA: Diagnosis not present

## 2015-10-03 DIAGNOSIS — Z3200 Encounter for pregnancy test, result unknown: Secondary | ICD-10-CM

## 2015-10-03 DIAGNOSIS — D225 Melanocytic nevi of trunk: Secondary | ICD-10-CM | POA: Diagnosis not present

## 2015-10-03 DIAGNOSIS — M25561 Pain in right knee: Secondary | ICD-10-CM

## 2015-10-03 DIAGNOSIS — S8002XA Contusion of left knee, initial encounter: Secondary | ICD-10-CM | POA: Diagnosis not present

## 2015-10-03 LAB — POCT CBC
Granulocyte percent: 72.3 %G (ref 37–80)
HCT, POC: 37.8 % (ref 37.7–47.9)
Hemoglobin: 12.3 g/dL (ref 12.2–16.2)
Lymph, poc: 1.2 (ref 0.6–3.4)
MCH, POC: 30.6 pg (ref 27–31.2)
MCHC: 32.6 g/dL (ref 31.8–35.4)
MCV: 94.1 fL (ref 80–97)
MID (cbc): 0.6 (ref 0–0.9)
MPV: 7.9 fL (ref 0–99.8)
POC Granulocyte: 4.6 (ref 2–6.9)
POC LYMPH PERCENT: 18.5 %L (ref 10–50)
POC MID %: 9.2 %M (ref 0–12)
Platelet Count, POC: 256 10*3/uL (ref 142–424)
RBC: 4.02 M/uL — AB (ref 4.04–5.48)
RDW, POC: 13.9 %
WBC: 6.4 10*3/uL (ref 4.6–10.2)

## 2015-10-03 LAB — POCT URINE PREGNANCY: Preg Test, Ur: NEGATIVE
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
## 2015-10-03 NOTE — Telephone Encounter (Signed)
Left a message for patient to call back. 

## 2015-10-06 ENCOUNTER — Ambulatory Visit
Admission: RE | Admit: 2015-10-06 | Discharge: 2015-10-06 | Disposition: A | Discharge status: Home / Self Care | Payer: Medicare Other | Source: Ambulatory Visit | Attending: Emergency Medicine | Admitting: Emergency Medicine

## 2015-10-06 DIAGNOSIS — Z1231 Encounter for screening mammogram for malignant neoplasm of breast: Secondary | ICD-10-CM | POA: Diagnosis not present

## 2015-10-06 DIAGNOSIS — F3181 Bipolar II disorder: Secondary | ICD-10-CM | POA: Diagnosis not present

## 2015-10-07 ENCOUNTER — Other Ambulatory Visit: Payer: Self-pay | Admitting: Emergency Medicine

## 2015-10-07 DIAGNOSIS — R928 Other abnormal and inconclusive findings on diagnostic imaging of breast: Secondary | ICD-10-CM

## 2015-10-07 NOTE — Telephone Encounter (Signed)
Patient given recall. She does not have any symptoms. She will see Dr. Havery Moros.

## 2015-10-07 NOTE — Progress Notes (Signed)
The office will attempt to reschedule

## 2015-10-08 DIAGNOSIS — F3181 Bipolar II disorder: Secondary | ICD-10-CM | POA: Diagnosis not present

## 2015-10-09 DIAGNOSIS — F3181 Bipolar II disorder: Secondary | ICD-10-CM | POA: Diagnosis not present

## 2015-10-10 ENCOUNTER — Ambulatory Visit
Admission: RE | Admit: 2015-10-10 | Discharge: 2015-10-10 | Disposition: A | Discharge status: Home / Self Care | Payer: Medicare Other | Source: Ambulatory Visit | Attending: Emergency Medicine | Admitting: Emergency Medicine

## 2015-10-10 DIAGNOSIS — N6489 Other specified disorders of breast: Secondary | ICD-10-CM | POA: Diagnosis not present

## 2015-10-10 DIAGNOSIS — R922 Inconclusive mammogram: Secondary | ICD-10-CM | POA: Diagnosis not present

## 2015-10-10 DIAGNOSIS — R928 Other abnormal and inconclusive findings on diagnostic imaging of breast: Secondary | ICD-10-CM

## 2015-10-13 DIAGNOSIS — F3181 Bipolar II disorder: Secondary | ICD-10-CM | POA: Diagnosis not present

## 2015-10-16 ENCOUNTER — Ambulatory Visit: Payer: Medicare Other | Admitting: Emergency Medicine

## 2015-10-20 DIAGNOSIS — F3181 Bipolar II disorder: Secondary | ICD-10-CM | POA: Diagnosis not present

## 2015-10-22 DIAGNOSIS — F3181 Bipolar II disorder: Secondary | ICD-10-CM | POA: Diagnosis not present

## 2015-10-23 DIAGNOSIS — F3181 Bipolar II disorder: Secondary | ICD-10-CM | POA: Diagnosis not present

## 2015-10-25 ENCOUNTER — Other Ambulatory Visit: Payer: Self-pay | Admitting: Gynecology

## 2015-10-27 DIAGNOSIS — F3181 Bipolar II disorder: Secondary | ICD-10-CM | POA: Diagnosis not present

## 2015-10-29 DIAGNOSIS — F3181 Bipolar II disorder: Secondary | ICD-10-CM | POA: Diagnosis not present

## 2015-10-30 DIAGNOSIS — F3181 Bipolar II disorder: Secondary | ICD-10-CM | POA: Diagnosis not present

## 2015-11-03 DIAGNOSIS — F3181 Bipolar II disorder: Secondary | ICD-10-CM | POA: Diagnosis not present

## 2015-11-03 DIAGNOSIS — L821 Other seborrheic keratosis: Secondary | ICD-10-CM | POA: Diagnosis not present

## 2015-11-03 DIAGNOSIS — D239 Other benign neoplasm of skin, unspecified: Secondary | ICD-10-CM | POA: Diagnosis not present

## 2015-11-06 DIAGNOSIS — F3181 Bipolar II disorder: Secondary | ICD-10-CM | POA: Diagnosis not present

## 2015-11-14 DIAGNOSIS — F3181 Bipolar II disorder: Secondary | ICD-10-CM | POA: Diagnosis not present

## 2015-11-17 DIAGNOSIS — F3181 Bipolar II disorder: Secondary | ICD-10-CM | POA: Diagnosis not present

## 2015-11-24 DIAGNOSIS — F3181 Bipolar II disorder: Secondary | ICD-10-CM | POA: Diagnosis not present

## 2015-11-27 DIAGNOSIS — F3181 Bipolar II disorder: Secondary | ICD-10-CM | POA: Diagnosis not present

## 2015-12-01 DIAGNOSIS — F3181 Bipolar II disorder: Secondary | ICD-10-CM | POA: Diagnosis not present

## 2015-12-04 DIAGNOSIS — F3181 Bipolar II disorder: Secondary | ICD-10-CM | POA: Diagnosis not present

## 2015-12-08 DIAGNOSIS — F3181 Bipolar II disorder: Secondary | ICD-10-CM | POA: Diagnosis not present

## 2015-12-11 DIAGNOSIS — F3181 Bipolar II disorder: Secondary | ICD-10-CM | POA: Diagnosis not present

## 2015-12-25 DIAGNOSIS — F3181 Bipolar II disorder: Secondary | ICD-10-CM | POA: Diagnosis not present

## 2015-12-29 DIAGNOSIS — F3181 Bipolar II disorder: Secondary | ICD-10-CM | POA: Diagnosis not present

## 2016-01-01 DIAGNOSIS — F3181 Bipolar II disorder: Secondary | ICD-10-CM | POA: Diagnosis not present

## 2016-01-07 ENCOUNTER — Encounter: Payer: Medicare Other | Admitting: Gynecology

## 2016-01-08 DIAGNOSIS — F3181 Bipolar II disorder: Secondary | ICD-10-CM | POA: Diagnosis not present

## 2016-01-12 DIAGNOSIS — F3181 Bipolar II disorder: Secondary | ICD-10-CM | POA: Diagnosis not present

## 2016-01-15 DIAGNOSIS — F3181 Bipolar II disorder: Secondary | ICD-10-CM | POA: Diagnosis not present

## 2016-01-19 DIAGNOSIS — F3181 Bipolar II disorder: Secondary | ICD-10-CM | POA: Diagnosis not present

## 2016-01-22 ENCOUNTER — Encounter: Payer: Medicare Other | Admitting: Gynecology

## 2016-01-22 DIAGNOSIS — F3181 Bipolar II disorder: Secondary | ICD-10-CM | POA: Diagnosis not present

## 2016-01-25 ENCOUNTER — Other Ambulatory Visit: Payer: Self-pay | Admitting: Gynecology

## 2016-01-26 DIAGNOSIS — F3181 Bipolar II disorder: Secondary | ICD-10-CM | POA: Diagnosis not present

## 2016-01-26 DIAGNOSIS — Z23 Encounter for immunization: Secondary | ICD-10-CM | POA: Diagnosis not present

## 2016-02-02 DIAGNOSIS — F3181 Bipolar II disorder: Secondary | ICD-10-CM | POA: Diagnosis not present

## 2016-02-04 ENCOUNTER — Encounter: Payer: Self-pay | Admitting: Gynecology

## 2016-02-04 ENCOUNTER — Ambulatory Visit (INDEPENDENT_AMBULATORY_CARE_PROVIDER_SITE_OTHER): Payer: Medicare Other | Admitting: Gynecology

## 2016-02-04 VITALS — BP 116/70 | Ht 66.0 in | Wt 110.0 lb

## 2016-02-04 DIAGNOSIS — Z01419 Encounter for gynecological examination (general) (routine) without abnormal findings: Secondary | ICD-10-CM | POA: Diagnosis not present

## 2016-02-04 DIAGNOSIS — M8580 Other specified disorders of bone density and structure, unspecified site: Secondary | ICD-10-CM

## 2016-02-04 DIAGNOSIS — Z79890 Hormone replacement therapy (postmenopausal): Secondary | ICD-10-CM

## 2016-02-04 DIAGNOSIS — N952 Postmenopausal atrophic vaginitis: Secondary | ICD-10-CM

## 2016-02-04 MED ORDER — PROGESTERONE MICRONIZED 100 MG PO CAPS: 100.0000 mg | ORAL_CAPSULE | Freq: Every day | ORAL | Status: DC

## 2016-02-04 MED ORDER — ESTRADIOL 1 MG PO TABS: 1.0000 mg | ORAL_TABLET | Freq: Every day | ORAL | Status: DC

## 2016-02-04 NOTE — Progress Notes (Signed)
Anne Little 21-Jan-1961 [id number]        55 y.o.  [id number]  for Breast and pelvic exam. Several issues noted below.  Past medical history,surgical history, problem list, medications, allergies, family history and social history were all reviewed and documented as reviewed in the EPIC chart.  ROS:  Performed with pertinent positives and negatives included in the history, assessment and plan.   Additional significant findings :  none   Exam: [name] assistant Filed Vitals:   02/04/16 1159  BP: 116/70  Height: 5\' 6"  (1.676 m)  Weight: 110 lb (49.896 kg)   General appearance:  Normal affect, orientation and appearance. Skin: Grossly normal HEENT: Without gross lesions.  No cervical or supraclavicular adenopathy. Thyroid normal.  Lungs:  Clear without wheezing, rales or rhonchi Cardiac: RR, without RMG Abdominal:  Soft, nontender, without masses, guarding, rebound, organomegaly or hernia Breasts:  Examined lying and sitting without masses, retractions, discharge or axillary adenopathy. Pelvic:  Ext/BUS/vagina with atrophic changes  Cervix with atrophic changes  Uterus anteverted, normal size, shape and contour, midline and mobile nontender   Adnexa without masses or tenderness    Anus and perineum normal   Rectovaginal normal sphincter tone without palpated masses or tenderness.    Assessment/Plan:  55 y.o. [id number] female for breast and pelvic exam.   1. Postmenopausal/atrophic genital changes/HRT. Patient was begun on estradiol 0.5 mg and Prometrium 100 mg daily last year.  She said she did not like the way the progesterone made her feel and stopped it but continued the estradiol daily. Still does not feel like her hormone levels are correct.  She's done no bleeding. She did have a sonohysterogram last year with endometrial echo 1.8 mm and and endometrial biopsy showing scant endometrial tissue consistent with an atrophic pattern. I reviewed with her the issues about endometrial  surveillance whether we should do a biopsy or look at the endometrium with ultrasound as she's been on unopposed low-dose estrogen for year. Given the thin endometrium a year ago with negative biopsy I think at this point is reasonable not to pursue this but certainly she has any bleeding or other issues to follow up. She understands the issues of hyperplasia precancerous endometrial cancer and she is comfortable not doing anything at this point. She does want to continue on HRT. I again reviewed the risks include stroke heart attack DVT and possible breast cancer. Will increase her estrogen to 1 mg daily and continue with Prometrium 100 mg daily. I emphasized the need to continue on the progesterone daily and if she has any issues to call me not to stop it. Patient will go ahead and start this will see how she does. 2. Osteopenia. DEXA 2015 T score -1.6. Being followed by Dr. Chalmers Cater with reported hyperparathyroidism they thought was secondary to medication. She'll continue to follow up with Dr. Chalmers Cater in reference to her bone health. 3. Mammography 09/2015. Continue with annual mammography when due. SBE reviewed. 4. Pap smear 09/2014. No Pap smear done today. No history of significant abnormal Pap smears. 5. Colposcopy 2013. Repeat at their recommended interval. 6. Health maintenance. No routine lab work done as this is done at her primary physician's office. Follow up 1 year, sooner if any issues with her HRT or any bleeding.   Anastasio Auerbach MD, 12:26 PM 02/04/2016

## 2016-02-04 NOTE — Patient Instructions (Signed)
Continue on the estrogen at the higher dose. Make sure you continue on the progesterone daily. Call me if you have any bleeding or any questions.  Follow up with Dr. Chalmers Cater in reference to the calcium question and management of your bone health.  You may obtain a copy of any labs that were done today by logging onto MyChart as outlined in the instructions provided with your AVS (after visit summary). The office will not call with normal lab results but certainly if there are any significant abnormalities then we will contact you.   Health Maintenance Adopting a healthy lifestyle and getting preventive care can go a long way to promote health and wellness. Talk with your health care provider about what schedule of regular examinations is right for you. This is a good chance for you to check in with your provider about disease prevention and staying healthy. In between checkups, there are plenty of things you can do on your own. Experts have done a lot of research about which lifestyle changes and preventive measures are most likely to keep you healthy. Ask your health care provider for more information. WEIGHT AND DIET  Eat a healthy diet  Be sure to include plenty of vegetables, fruits, low-fat dairy products, and lean protein.  Do not eat a lot of foods high in solid fats, added sugars, or salt.  Get regular exercise. This is one of the most important things you can do for your health.  Most adults should exercise for at least 150 minutes each week. The exercise should increase your heart rate and make you sweat (moderate-intensity exercise).  Most adults should also do strengthening exercises at least twice a week. This is in addition to the moderate-intensity exercise.  Maintain a healthy weight  Body mass index (BMI) is a measurement that can be used to identify possible weight problems. It estimates body fat based on height and weight. Your health care provider can help determine your BMI  and help you achieve or maintain a healthy weight.  For females 6 years of age and older:   A BMI below 18.5 is considered underweight.  A BMI of 18.5 to 24.9 is normal.  A BMI of 25 to 29.9 is considered overweight.  A BMI of 30 and above is considered obese.  Watch levels of cholesterol and blood lipids  You should start having your blood tested for lipids and cholesterol at 57 years of age, then have this test every 5 years.  You may need to have your cholesterol levels checked more often if:  Your lipid or cholesterol levels are high.  You are older than 57 years of age.  You are at high risk for heart disease.  CANCER SCREENING   Lung Cancer  Lung cancer screening is recommended for adults 73-84 years old who are at high risk for lung cancer because of a history of smoking.  A yearly low-dose CT scan of the lungs is recommended for people who:  Currently smoke.  Have quit within the past 15 years.  Have at least a 30-pack-year history of smoking. A pack year is smoking an average of one pack of cigarettes a day for 1 year.  Yearly screening should continue until it has been 15 years since you quit.  Yearly screening should stop if you develop a health problem that would prevent you from having lung cancer treatment.  Breast Cancer  Practice breast self-awareness. This means understanding how your breasts normally appear and feel.  It also means doing regular breast self-exams. Let your health care provider know about any changes, no matter how small.  If you are in your 20s or 30s, you should have a clinical breast exam (CBE) by a health care provider every 1-3 years as part of a regular health exam.  If you are 72 or older, have a CBE every year. Also consider having a breast X-ray (mammogram) every year.  If you have a family history of breast cancer, talk to your health care provider about genetic screening.  If you are at high risk for breast cancer,  talk to your health care provider about having an MRI and a mammogram every year.  Breast cancer gene (BRCA) assessment is recommended for women who have family members with BRCA-related cancers. BRCA-related cancers include:  Breast.  Ovarian.  Tubal.  Peritoneal cancers.  Results of the assessment will determine the need for genetic counseling and BRCA1 and BRCA2 testing. Cervical Cancer Routine pelvic examinations to screen for cervical cancer are no longer recommended for nonpregnant women who are considered low risk for cancer of the pelvic organs (ovaries, uterus, and vagina) and who do not have symptoms. A pelvic examination may be necessary if you have symptoms including those associated with pelvic infections. Ask your health care provider if a screening pelvic exam is right for you.   The Pap test is the screening test for cervical cancer for women who are considered at risk.  If you had a hysterectomy for a problem that was not cancer or a condition that could lead to cancer, then you no longer need Pap tests.  If you are older than 65 years, and you have had normal Pap tests for the past 10 years, you no longer need to have Pap tests.  If you have had past treatment for cervical cancer or a condition that could lead to cancer, you need Pap tests and screening for cancer for at least 20 years after your treatment.  If you no longer get a Pap test, assess your risk factors if they change (such as having a new sexual partner). This can affect whether you should start being screened again.  Some women have medical problems that increase their chance of getting cervical cancer. If this is the case for you, your health care provider may recommend more frequent screening and Pap tests.  The human papillomavirus (HPV) test is another test that may be used for cervical cancer screening. The HPV test looks for the virus that can cause cell changes in the cervix. The cells collected  during the Pap test can be tested for HPV.  The HPV test can be used to screen women 75 years of age and older. Getting tested for HPV can extend the interval between normal Pap tests from three to five years.  An HPV test also should be used to screen women of any age who have unclear Pap test results.  After 57 years of age, women should have HPV testing as often as Pap tests.  Colorectal Cancer  This type of cancer can be detected and often prevented.  Routine colorectal cancer screening usually begins at 57 years of age and continues through 57 years of age.  Your health care provider may recommend screening at an earlier age if you have risk factors for colon cancer.  Your health care provider may also recommend using home test kits to check for hidden blood in the stool.  A small camera at the  of a tube can be used to examine your colon directly (sigmoidoscopy or colonoscopy). This is done to check for the earliest forms of colorectal cancer.  Routine screening usually begins at age 50.  Direct examination of the colon should be repeated every 5-10 years through 57 years of age. However, you may need to be screened more often if early forms of precancerous polyps or small growths are found. Skin Cancer  Check your skin from head to toe regularly.  Tell your health care provider about any new moles or changes in moles, especially if there is a change in a mole's shape or color.  Also tell your health care provider if you have a mole that is larger than the size of a pencil eraser.  Always use sunscreen. Apply sunscreen liberally and repeatedly throughout the day.  Protect yourself by wearing long sleeves, pants, a wide-brimmed hat, and sunglasses whenever you are outside. HEART DISEASE, DIABETES, AND HIGH BLOOD PRESSURE   Have your blood pressure checked at least every 1-2 years. High blood pressure causes heart disease and increases the risk of stroke.  If you are  between 55 years and 79 years old, ask your health care provider if you should take aspirin to prevent strokes.  Have regular diabetes screenings. This involves taking a blood sample to check your fasting blood sugar level.  If you are at a normal weight and have a low risk for diabetes, have this test once every three years after 57 years of age.  If you are overweight and have a high risk for diabetes, consider being tested at a younger age or more often. PREVENTING INFECTION  Hepatitis B  If you have a higher risk for hepatitis B, you should be screened for this virus. You are considered at high risk for hepatitis B if:  You were born in a country where hepatitis B is common. Ask your health care provider which countries are considered high risk.  Your parents were born in a high-risk country, and you have not been immunized against hepatitis B (hepatitis B vaccine).  You have HIV or AIDS.  You use needles to inject street drugs.  You live with someone who has hepatitis B.  You have had sex with someone who has hepatitis B.  You get hemodialysis treatment.  You take certain medicines for conditions, including cancer, organ transplantation, and autoimmune conditions. Hepatitis C  Blood testing is recommended for:  Everyone born from 1945 through 1965.  Anyone with known risk factors for hepatitis C. Sexually transmitted infections (STIs)  You should be screened for sexually transmitted infections (STIs) including gonorrhea and chlamydia if:  You are sexually active and are younger than 57 years of age.  You are older than 57 years of age and your health care provider tells you that you are at risk for this type of infection.  Your sexual activity has changed since you were last screened and you are at an increased risk for chlamydia or gonorrhea. Ask your health care provider if you are at risk.  If you do not have HIV, but are at risk, it may be recommended that you  take a prescription medicine daily to prevent HIV infection. This is called pre-exposure prophylaxis (PrEP). You are considered at risk if:  You are sexually active and do not regularly use condoms or know the HIV status of your partner(s).  You take drugs by injection.  You are sexually active with a partner who has HIV.   HIV. Talk with your health care provider about whether you are at high risk of being infected with HIV. If you choose to begin PrEP, you should first be tested for HIV. You should then be tested every 3 months for as long as you are taking PrEP.  PREGNANCY   If you are premenopausal and you may become pregnant, ask your health care provider about preconception counseling.  If you may become pregnant, take 400 to 800 micrograms (mcg) of folic acid every day.  If you want to prevent pregnancy, talk to your health care provider about birth control (contraception). OSTEOPOROSIS AND MENOPAUSE   Osteoporosis is a disease in which the bones lose minerals and strength with aging. This can result in serious bone fractures. Your risk for osteoporosis can be identified using a bone density scan.  If you are 27 years of age or older, or if you are at risk for osteoporosis and fractures, ask your health care provider if you should be screened.  Ask your health care provider whether you should take a calcium or vitamin D supplement to lower your risk for osteoporosis.  Menopause may have certain physical symptoms and risks.  Hormone replacement therapy may reduce some of these symptoms and risks. Talk to your health care provider about whether hormone replacement therapy is right for you.  HOME CARE INSTRUCTIONS   Schedule regular health, dental, and eye exams.  Stay current with your immunizations.   Do not use any tobacco products including cigarettes, chewing tobacco, or electronic cigarettes.  If you are pregnant, do not drink alcohol.  If you are breastfeeding, limit how  much and how often you drink alcohol.  Limit alcohol intake to no more than 1 drink per day for nonpregnant women. One drink equals 12 ounces of beer, 5 ounces of wine, or 1 ounces of hard liquor.  Do not use street drugs.  Do not share needles.  Ask your health care provider for help if you need support or information about quitting drugs.  Tell your health care provider if you often feel depressed.  Tell your health care provider if you have ever been abused or do not feel safe at home. Document Released: 06/28/2011 Document Revised: 04/29/2014 Document Reviewed: 11/14/2013 [facility] Patient Information 2015 [place], [place]. This information is not intended to replace advice given to you by your health care provider. Make sure you discuss any questions you have with your health care provider.

## 2016-02-05 DIAGNOSIS — F3181 Bipolar II disorder: Secondary | ICD-10-CM | POA: Diagnosis not present

## 2016-02-09 DIAGNOSIS — F3181 Bipolar II disorder: Secondary | ICD-10-CM | POA: Diagnosis not present

## 2016-02-16 DIAGNOSIS — F3181 Bipolar II disorder: Secondary | ICD-10-CM | POA: Diagnosis not present

## 2016-02-19 DIAGNOSIS — F3181 Bipolar II disorder: Secondary | ICD-10-CM | POA: Diagnosis not present

## 2016-02-23 DIAGNOSIS — F3181 Bipolar II disorder: Secondary | ICD-10-CM | POA: Diagnosis not present

## 2016-02-26 DIAGNOSIS — F3181 Bipolar II disorder: Secondary | ICD-10-CM | POA: Diagnosis not present

## 2016-03-01 DIAGNOSIS — F3181 Bipolar II disorder: Secondary | ICD-10-CM | POA: Diagnosis not present

## 2016-03-04 DIAGNOSIS — F3181 Bipolar II disorder: Secondary | ICD-10-CM | POA: Diagnosis not present

## 2016-03-08 DIAGNOSIS — F3181 Bipolar II disorder: Secondary | ICD-10-CM | POA: Diagnosis not present

## 2016-03-15 DIAGNOSIS — F3181 Bipolar II disorder: Secondary | ICD-10-CM | POA: Diagnosis not present

## 2016-03-22 DIAGNOSIS — F3181 Bipolar II disorder: Secondary | ICD-10-CM | POA: Diagnosis not present

## 2016-03-25 DIAGNOSIS — F3181 Bipolar II disorder: Secondary | ICD-10-CM | POA: Diagnosis not present

## 2016-03-29 DIAGNOSIS — F3181 Bipolar II disorder: Secondary | ICD-10-CM | POA: Diagnosis not present

## 2016-04-01 DIAGNOSIS — F3181 Bipolar II disorder: Secondary | ICD-10-CM | POA: Diagnosis not present

## 2016-04-08 DIAGNOSIS — F3181 Bipolar II disorder: Secondary | ICD-10-CM | POA: Diagnosis not present

## 2016-04-12 DIAGNOSIS — F3181 Bipolar II disorder: Secondary | ICD-10-CM | POA: Diagnosis not present

## 2016-04-19 DIAGNOSIS — F3181 Bipolar II disorder: Secondary | ICD-10-CM | POA: Diagnosis not present

## 2016-04-20 DIAGNOSIS — E559 Vitamin D deficiency, unspecified: Secondary | ICD-10-CM | POA: Diagnosis not present

## 2016-04-20 DIAGNOSIS — E039 Hypothyroidism, unspecified: Secondary | ICD-10-CM | POA: Diagnosis not present

## 2016-04-20 DIAGNOSIS — E213 Hyperparathyroidism, unspecified: Secondary | ICD-10-CM | POA: Diagnosis not present

## 2016-04-20 DIAGNOSIS — M899 Disorder of bone, unspecified: Secondary | ICD-10-CM | POA: Diagnosis not present

## 2016-04-22 DIAGNOSIS — F3181 Bipolar II disorder: Secondary | ICD-10-CM | POA: Diagnosis not present

## 2016-04-22 DIAGNOSIS — E039 Hypothyroidism, unspecified: Secondary | ICD-10-CM | POA: Diagnosis not present

## 2016-04-22 DIAGNOSIS — M899 Disorder of bone, unspecified: Secondary | ICD-10-CM | POA: Diagnosis not present

## 2016-04-22 DIAGNOSIS — E213 Hyperparathyroidism, unspecified: Secondary | ICD-10-CM | POA: Diagnosis not present

## 2016-04-22 DIAGNOSIS — E559 Vitamin D deficiency, unspecified: Secondary | ICD-10-CM | POA: Diagnosis not present

## 2016-04-26 DIAGNOSIS — F3181 Bipolar II disorder: Secondary | ICD-10-CM | POA: Diagnosis not present

## 2016-04-29 DIAGNOSIS — F3181 Bipolar II disorder: Secondary | ICD-10-CM | POA: Diagnosis not present

## 2016-05-03 DIAGNOSIS — F3181 Bipolar II disorder: Secondary | ICD-10-CM | POA: Diagnosis not present

## 2016-05-04 ENCOUNTER — Telehealth: Payer: Self-pay | Admitting: Gastroenterology

## 2016-05-04 NOTE — Telephone Encounter (Signed)
Spoke with patient and explained we have not seen her for reflux before. Since this is a new issue suggested she see her PCP prior to our OV.

## 2016-05-10 DIAGNOSIS — F3181 Bipolar II disorder: Secondary | ICD-10-CM | POA: Diagnosis not present

## 2016-05-11 ENCOUNTER — Telehealth: Payer: Self-pay | Admitting: *Deleted

## 2016-05-11 NOTE — Telephone Encounter (Signed)
Coverage determination form done online for progesterone 100 mg, will wait for response.

## 2016-05-12 NOTE — Telephone Encounter (Signed)
Dr.Fontaine Pt progesterone 100 mg is not a covered drug unless pt has tried and failed medication such as medroxyprogesterone. Is this an option for pt to start along with estradiol 1 mg. Please advise

## 2016-05-13 DIAGNOSIS — F3181 Bipolar II disorder: Secondary | ICD-10-CM | POA: Diagnosis not present

## 2016-05-13 MED ORDER — MEDROXYPROGESTERONE ACETATE 2.5 MG PO TABS: 2.5000 mg | ORAL_TABLET | Freq: Every day | ORAL | Status: DC

## 2016-05-13 NOTE — Telephone Encounter (Signed)
Okay for medroxyprogesterone 2.5 mg daily

## 2016-05-13 NOTE — Telephone Encounter (Signed)
Pt informed with the below note, Rx sent. 

## 2016-05-20 DIAGNOSIS — F3181 Bipolar II disorder: Secondary | ICD-10-CM | POA: Diagnosis not present

## 2016-05-27 DIAGNOSIS — F3181 Bipolar II disorder: Secondary | ICD-10-CM | POA: Diagnosis not present

## 2016-06-03 DIAGNOSIS — F3181 Bipolar II disorder: Secondary | ICD-10-CM | POA: Diagnosis not present

## 2016-06-07 DIAGNOSIS — F3181 Bipolar II disorder: Secondary | ICD-10-CM | POA: Diagnosis not present

## 2016-06-10 DIAGNOSIS — F3181 Bipolar II disorder: Secondary | ICD-10-CM | POA: Diagnosis not present

## 2016-06-14 DIAGNOSIS — F3181 Bipolar II disorder: Secondary | ICD-10-CM | POA: Diagnosis not present

## 2016-06-17 DIAGNOSIS — F3181 Bipolar II disorder: Secondary | ICD-10-CM | POA: Diagnosis not present

## 2016-06-21 DIAGNOSIS — F3181 Bipolar II disorder: Secondary | ICD-10-CM | POA: Diagnosis not present

## 2016-06-22 ENCOUNTER — Ambulatory Visit (INDEPENDENT_AMBULATORY_CARE_PROVIDER_SITE_OTHER): Payer: Medicare Other | Admitting: Gastroenterology

## 2016-06-22 ENCOUNTER — Encounter: Payer: Self-pay | Admitting: Gastroenterology

## 2016-06-22 VITALS — BP 112/78 | HR 64 | Ht 65.75 in | Wt 111.1 lb

## 2016-06-22 DIAGNOSIS — K589 Irritable bowel syndrome without diarrhea: Secondary | ICD-10-CM

## 2016-06-22 DIAGNOSIS — R140 Abdominal distension (gaseous): Secondary | ICD-10-CM

## 2016-06-22 DIAGNOSIS — R1013 Epigastric pain: Secondary | ICD-10-CM | POA: Diagnosis not present

## 2016-06-22 MED ORDER — DICYCLOMINE HCL 10 MG PO CAPS: 10.0000 mg | ORAL_CAPSULE | Freq: Three times a day (TID) | ORAL | Status: DC | PRN

## 2016-06-22 NOTE — Progress Notes (Signed)
HPI :  55 y/o female here for assessment of abdominal pain. 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 She has not been seen since November 2014. Patient endorses her stomach has been bothering her, symptoms started in January. She has epigastric discomfort. The discomfort would initially come and go, without clear triggers. She is not sure if she had any relation to eating, perhaps at times, and she avoid food at times due to symptoms. She denied nausea or vomiting. She does endorse poor appetite at times, and endorsed some early satiety and bloating. She denied any weight loss. She denied much pyrosis / heartburn. She reported a history of voice changes which led to using PPI for a period of time which led to resolution of her symptoms remotely. She reports a prior endoscopy in her 64s, but nothing recently.   She took some nexium for 14 days in a row, and did this 3 separate times, which improved her symptoms each time. She now takes it daily 20mg . She reports her symptoms are significantly improved on low dose nexium but not completely resolved. She thinks stress now makes her symptoms worse. She is on disability for mental health issues she reports.   She thinks she has IBS. She has a BM once every day, but endorses some constipation which bothers her, she has a stools softener which she takes daily and it helps her. She endorses a lot of bloating otherwise which can bother her at times.   No FH of gastric cancer. More distant relatives including an aunt has had colon cancer / Lynch syndrome. Her mother tested negative for Lynch syndrome.   Colonoscopy March 2013, one small adenoma (done at [facility])  Past Medical History  Diagnosis Date  . Depression   . Bipolar 1 disorder ([place])   . Hypothyroidism   . IBS (irritable bowel syndrome)   . Colon polyps   . Anxiety   . GERD (gastroesophageal reflux disease)   . Hyperlipidemia   . Thyroid  disease   . Kidney stones   . Hepatitis   . PTSD (post-traumatic stress disorder)   . Hx of adenomatous colonic polyps   . Allergy   . Anemia   . Parathyroid disease ([place])   . Endometriosis   . Osteopenia   . RLS (restless legs syndrome) 02/11/2015  . Chronic post-traumatic stress disorder (PTSD) 02/11/2015     Past Surgical History  Procedure Laterality Date  . Appendectomy    . Other surgical history  1980    endometriosis/ectopic pregnancy  . Colonoscopy  2013    [facility]  . Ectopic pregnancy surgery     Family History  Problem Relation Age of Onset  . Colon cancer Maternal Aunt   . Hyperlipidemia Mother   . Bladder Cancer Father   . Hyperlipidemia Father   . Hypertension Father   . Hyperlipidemia Sister   . Hyperlipidemia Maternal Grandmother   . Hypertension Maternal Grandmother   . Stroke Maternal Grandmother   . Mental illness Maternal Grandfather   . Alcoholism Maternal Grandfather   . Heart disease Paternal Grandmother   . Hyperlipidemia Paternal Grandmother   . Hypertension Paternal Grandmother   . Stroke Paternal Grandmother   . Cancer Paternal Grandfather     Colon?  . Ovarian cancer Maternal Aunt     Lynch syndrome   Social History  Substance Use Topics  . Smoking status: Former Smoker    Quit date: 12/27/1986  .  Smokeless tobacco: Never Used     Comment: quit smoking 26 years ago  . Alcohol Use: 0.0 oz/week    0 Standard drinks or equivalent per week     Comment: Rare   Current Outpatient Prescriptions  Medication Sig Dispense Refill  . buPROPion (WELLBUTRIN XL) 300 MG 24 hr tablet Take 300 mg by mouth daily.   0  . Cholecalciferol (VITAMIN D PO) Take by mouth.    . Esomeprazole Magnesium (NEXIUM 24HR) 20 MG TBEC Take 1 tablet by mouth daily.    Marland Kitchen estradiol (ESTRACE) 1 MG tablet Take 1 tablet (1 mg total) by mouth daily. 30 tablet 12  . gabapentin (NEURONTIN) 300 MG capsule Take 3 capsules (900 mg total) by mouth at bedtime. agitation 90 capsule 0    . levothyroxine (SYNTHROID) 50 MCG tablet Take 50 mcg by mouth. Take 5 days a week    . levothyroxine (SYNTHROID, LEVOTHROID) 75 MCG tablet Take 75 mcg by mouth. Take 2 days a week    . lithium carbonate 300 MG capsule Take 300 mg by mouth daily. Take 1 cap in the evening    . medroxyPROGESTERone (PROVERA) 2.5 MG tablet Take 1 tablet (2.5 mg total) by mouth daily. 30 tablet 11  . propranolol (INDERAL) 10 MG tablet Take 5 mg by mouth at bedtime. Anxiety/high blood pressure     No current facility-administered medications for this visit.   Allergies  Allergen Reactions  . Benzodiazepines Other (See Comments)    paradoxical effect   . Erythromycin Other (See Comments)    vomiting  . Macrolides And Ketolides Other (See Comments)  . Neomycin Swelling    "topical mycins"  . Penicillins Hives    Pt unsure if allergy, due to having seen an horrific sight and hives could be anxiety related.    . Trazodone And Nefazodone     Per pt she experiences a "paradoxical" reaction and shakiness with Trazodone in the past.   . Lamictal [Lamotrigine] Rash     Review of Systems: All systems reviewed and negative except where noted in HPI.   Lab Results  Component Value Date   WBC 6.4 10/03/2015   HGB 12.3 10/03/2015   HCT 37.8 10/03/2015   MCV 94.1 10/03/2015   PLT 297 02/13/2015    Lab Results  Component Value Date   CREATININE 1.2 02/13/2015   BUN 11 02/13/2015   NA 144 02/13/2015   K 4.1 02/13/2015   CL 104 02/13/2015   CO2 27 02/13/2015    Lab Results  Component Value Date   ALT 16 02/13/2015   AST 21 02/13/2015   ALKPHOS 81 02/13/2015   BILITOT 0.60 02/13/2015    Physical Exam: BP 112/78 mmHg  Pulse 64  Ht 5' 5.75" (1.67 m)  Wt 111 lb 2 oz (50.406 kg)  BMI 18.07 kg/m2 Constitutional: Pleasant,well-developed, female in no acute distress. HEENT: Normocephalic and atraumatic. Conjunctivae are normal. No scleral icterus. Neck supple.  Cardiovascular: Normal rate, regular  rhythm.  Pulmonary/chest: Effort normal and breath sounds normal. No wheezing, rales or rhonchi. Abdominal: Soft, nondistended, nontender. Bowel sounds active throughout. There are no masses palpable. No hepatomegaly. Extremities: no edema Lymphadenopathy: No cervical adenopathy noted. Neurological: Alert and oriented to person place and time. Skin: Skin is warm and dry. No rashes noted. Psychiatric: Normal mood and affect. Behavior is normal.   ASSESSMENT AND PLAN: 55 y/o female with medical history as outlined above, presenting with new onset epigastric discomfort for the past  6 months. She is significantly improved with PPI use, but has some lingering symptoms which bother her mildly. No clear triggers but symptoms do seem worse with anxiety. She could have functional dyspepsia but given her age and new onset of symptoms, recommend EGD to further evaluate, rule out H pylori, PUD, and will rule out celiac given her ongoing bloating. Discussed risks / benefits of EGD and she wished to proceed. She will continue PPI in the interim.   Otherwise, she does have a history of IBS. Counseled her a low FODMOP diet to see if this can help reduce some of her bloating and also will provide trial of bentyl to use PRN for cramps. She is due for a recall colonoscopy in March 2018 for history of adenoma in 2013.  [place] Cellar, MD [facility] Pager [phone]  CC: [name], MD

## 2016-06-22 NOTE — Patient Instructions (Signed)
You have been scheduled for an endoscopy. Please follow written instructions given to you at your visit today. If you use inhalers (even only as needed), please bring them with you on the day of your procedure. Your physician has requested that you go to www.[email or web address].[email or web address] and enter the access code given to you at your visit today. This web site gives a general overview about your procedure. However, you should still follow specific instructions given to you by our office regarding your preparation for the procedure.  Low FODMAP Diet Given We will send in Bentyl to your pharmacy

## 2016-06-24 DIAGNOSIS — F3181 Bipolar II disorder: Secondary | ICD-10-CM | POA: Diagnosis not present

## 2016-06-25 ENCOUNTER — Encounter: Payer: Self-pay | Admitting: Gastroenterology

## 2016-06-25 ENCOUNTER — Ambulatory Visit (AMBULATORY_SURGERY_CENTER): Payer: Medicare Other | Admitting: Gastroenterology

## 2016-06-25 VITALS — BP 106/68 | HR 68 | Temp 98.0°F | Resp 16 | Ht 65.0 in | Wt 111.0 lb

## 2016-06-25 DIAGNOSIS — R1013 Epigastric pain: Secondary | ICD-10-CM

## 2016-06-25 DIAGNOSIS — K2950 Unspecified chronic gastritis without bleeding: Secondary | ICD-10-CM | POA: Diagnosis not present

## 2016-06-25 MED ORDER — SODIUM CHLORIDE 0.9 % IV SOLN: 500.0000 mL | INTRAVENOUS | Status: DC

## 2016-06-25 NOTE — Progress Notes (Signed)
Called to room to assist during endoscopic procedure.  Patient ID and intended procedure confirmed with present staff. Received instructions for my participation in the procedure from the performing physician.  

## 2016-06-25 NOTE — Patient Instructions (Signed)
YOU HAD AN ENDOSCOPIC PROCEDURE TODAY AT [facility]:   Refer to the procedure report that was given to you for any specific questions about what was found during the examination.  If the procedure report does not answer your questions, please call your gastroenterologist to clarify.  If you requested that your care partner not be given the details of your procedure findings, then the procedure report has been included in a sealed envelope for you to review at your convenience later.  YOU SHOULD EXPECT: Some feelings of bloating in the abdomen. Passage of more gas than usual.  Walking can help get rid of the air that was put into your GI tract during the procedure and reduce the bloating. If you had a lower endoscopy (such as a colonoscopy or flexible sigmoidoscopy) you may notice spotting of blood in your stool or on the toilet paper. If you underwent a bowel prep for your procedure, you may not have a normal bowel movement for a few days.  Please Note:  You might notice some irritation and congestion in your nose or some drainage.  This is from the oxygen used during your procedure.  There is no need for concern and it should clear up in a day or so.  SYMPTOMS TO REPORT IMMEDIATELY:     Following upper endoscopy (EGD)  Vomiting of blood or coffee ground material  New chest pain or pain under the shoulder blades  Painful or persistently difficult swallowing  New shortness of breath  Fever of 100F or higher  Black, tarry-looking stools  For urgent or emergent issues, a gastroenterologist can be reached at any hour by calling [phone].   DIET: Your first meal following the procedure should be a small meal and then it is ok to progress to your normal diet. Heavy or fried foods are harder to digest and may make you feel nauseous or bloated.  Likewise, meals heavy in dairy and vegetables can increase bloating.  Drink plenty of fluids but you should avoid alcoholic beverages  for 24 hours.  ACTIVITY:  You should plan to take it easy for the rest of today and you should NOT DRIVE or use heavy machinery until tomorrow (because of the sedation medicines used during the test).    FOLLOW UP: Our staff will call the number listed on your records the next business day following your procedure to check on you and address any questions or concerns that you may have regarding the information given to you following your procedure. If we do not reach you, we will leave a message.  However, if you are feeling well and you are not experiencing any problems, there is no need to return our call.  We will assume that you have returned to your regular daily activities without incident.  If any biopsies were taken you will be contacted by phone or by letter within the next 1-3 weeks.  Please call us at [phone] if you have not heard about the biopsies in 3 weeks.    SIGNATURES/CONFIDENTIALITY: You and/or your care partner have signed paperwork which will be entered into your electronic medical record.  These signatures attest to the fact that that the information above on your After Visit Summary has been reviewed and is understood.  Full responsibility of the confidentiality of this discharge information lies with you and/or your care-partner.   AWAIT PATHOLOGY RESULTS

## 2016-06-25 NOTE — Progress Notes (Signed)
Teeth unchanged after procedure.A and O x3. Report to RN. Tolerated MAC anesthesia well. 

## 2016-06-25 NOTE — Op Note (Signed)
[facility] Patient Name: Anne Little Procedure Date: 06/25/2016 3:44 PM MRN: [id number] Endoscopist: Remo Lipps P. Havery Moros , MD Age: 55 Referring MD:  Date of Birth: 30-Jul-1961 Gender: Female Account #: [id number] Procedure:                Upper GI endoscopy Indications:              Epigastric abdominal pain, Abdominal bloating Medicines:                Monitored Anesthesia Care Procedure:                Pre-Anesthesia Assessment:                           - Prior to the procedure, a History and Physical                            was performed, and patient medications and                            allergies were reviewed. The patient's tolerance of                            previous anesthesia was also reviewed. The risks                            and benefits of the procedure and the sedation                            options and risks were discussed with the patient.                            All questions were answered, and informed consent                            was obtained. Prior Anticoagulants: The patient has                            taken no previous anticoagulant or antiplatelet                            agents. ASA Grade Assessment: II - A patient with                            mild systemic disease. After reviewing the risks                            and benefits, the patient was deemed in                            satisfactory condition to undergo the procedure.                           After obtaining informed consent, the endoscope was  passed under direct vision. Throughout the                            procedure, the patient's blood pressure, pulse, and                            oxygen saturations were monitored continuously. The                            Model GIF-[id number]) scope was introduced                            through the mouth, and advanced to the second part                            of  duodenum. The upper GI endoscopy was                            accomplished without difficulty. The patient                            tolerated the procedure well. Scope In: Scope Out: Findings:                 Esophagogastric landmarks were identified: the                            Z-line was found at 39 cm, the gastroesophageal                            junction was found at 39 cm and the upper extent of                            the gastric folds was found at 39 cm from the                            incisors.                           The exam of the esophagus was otherwise normal.                           The exam of the stomach was otherwise normal. No                            inflammatory changes or ulcerations were noted.                           Biopsies were taken with a cold forceps in the                            gastric body and in the gastric antrum for  Helicobacter pylori testing.                           The duodenal bulb and second portion of the                            duodenum were normal. Biopsies for histology were                            taken with a cold forceps for evaluation of celiac                            disease. Complications:            No immediate complications. Estimated blood loss:                            Minimal. Estimated Blood Loss:     Estimated blood loss was minimal. Impression:               - Esophagogastric landmarks identified, esophagus                            otherwise normal.                           - Normal stomach, no obvious pathology noted.                            Biopsies taken to rule out H pylori.                           - Normal duodenal bulb and second portion of the                            duodenum. Biopsied.                           - Biopsies were taken with a cold forceps for                            Helicobacter pylori testing. Recommendation:           -  Patient has a contact number available for                            emergencies. The signs and symptoms of potential                            delayed complications were discussed with the                            patient. Return to normal activities tomorrow.                            Written discharge instructions were provided to the  patient.                           - Resume previous diet.                           - Continue present medications.                           - Await pathology results. Remo Lipps P. Armbruster, MD 06/25/2016 4:11:07 PM This report has been signed electronically.

## 2016-06-28 ENCOUNTER — Telehealth: Payer: Self-pay | Admitting: *Deleted

## 2016-06-28 NOTE — Telephone Encounter (Signed)
  Follow up Call-  Call back number 06/25/2016  Post procedure Call Back phone  # [phone]  Permission to leave phone message Yes     No answer, left message

## 2016-07-01 DIAGNOSIS — F3181 Bipolar II disorder: Secondary | ICD-10-CM | POA: Diagnosis not present

## 2016-07-05 DIAGNOSIS — F3181 Bipolar II disorder: Secondary | ICD-10-CM | POA: Diagnosis not present

## 2016-07-08 DIAGNOSIS — F3181 Bipolar II disorder: Secondary | ICD-10-CM | POA: Diagnosis not present

## 2016-07-12 DIAGNOSIS — F3181 Bipolar II disorder: Secondary | ICD-10-CM | POA: Diagnosis not present

## 2016-07-15 DIAGNOSIS — F3181 Bipolar II disorder: Secondary | ICD-10-CM | POA: Diagnosis not present

## 2016-07-19 DIAGNOSIS — F3181 Bipolar II disorder: Secondary | ICD-10-CM | POA: Diagnosis not present

## 2016-07-22 DIAGNOSIS — F3181 Bipolar II disorder: Secondary | ICD-10-CM | POA: Diagnosis not present

## 2016-07-26 DIAGNOSIS — F3181 Bipolar II disorder: Secondary | ICD-10-CM | POA: Diagnosis not present

## 2016-07-29 DIAGNOSIS — F3181 Bipolar II disorder: Secondary | ICD-10-CM | POA: Diagnosis not present

## 2016-08-02 DIAGNOSIS — F3181 Bipolar II disorder: Secondary | ICD-10-CM | POA: Diagnosis not present

## 2016-08-05 DIAGNOSIS — F3181 Bipolar II disorder: Secondary | ICD-10-CM | POA: Diagnosis not present

## 2016-08-09 DIAGNOSIS — F3181 Bipolar II disorder: Secondary | ICD-10-CM | POA: Diagnosis not present

## 2016-08-12 DIAGNOSIS — F3181 Bipolar II disorder: Secondary | ICD-10-CM | POA: Diagnosis not present

## 2016-08-19 DIAGNOSIS — F3181 Bipolar II disorder: Secondary | ICD-10-CM | POA: Diagnosis not present

## 2016-08-23 DIAGNOSIS — F3181 Bipolar II disorder: Secondary | ICD-10-CM | POA: Diagnosis not present

## 2016-08-26 DIAGNOSIS — F3181 Bipolar II disorder: Secondary | ICD-10-CM | POA: Diagnosis not present

## 2016-08-31 DIAGNOSIS — H04123 Dry eye syndrome of bilateral lacrimal glands: Secondary | ICD-10-CM | POA: Diagnosis not present

## 2016-09-06 DIAGNOSIS — F3181 Bipolar II disorder: Secondary | ICD-10-CM | POA: Diagnosis not present

## 2016-09-09 DIAGNOSIS — F3181 Bipolar II disorder: Secondary | ICD-10-CM | POA: Diagnosis not present

## 2016-09-13 DIAGNOSIS — F3181 Bipolar II disorder: Secondary | ICD-10-CM | POA: Diagnosis not present

## 2016-09-16 DIAGNOSIS — F3181 Bipolar II disorder: Secondary | ICD-10-CM | POA: Diagnosis not present

## 2016-09-20 DIAGNOSIS — F3181 Bipolar II disorder: Secondary | ICD-10-CM | POA: Diagnosis not present

## 2016-09-23 DIAGNOSIS — F3181 Bipolar II disorder: Secondary | ICD-10-CM | POA: Diagnosis not present

## 2016-09-27 DIAGNOSIS — F3181 Bipolar II disorder: Secondary | ICD-10-CM | POA: Diagnosis not present

## 2016-09-30 DIAGNOSIS — F3181 Bipolar II disorder: Secondary | ICD-10-CM | POA: Diagnosis not present

## 2016-10-07 ENCOUNTER — Other Ambulatory Visit: Payer: Self-pay | Admitting: Gastroenterology

## 2016-10-07 DIAGNOSIS — F3181 Bipolar II disorder: Secondary | ICD-10-CM | POA: Diagnosis not present

## 2016-10-11 DIAGNOSIS — F3181 Bipolar II disorder: Secondary | ICD-10-CM | POA: Diagnosis not present

## 2016-10-13 ENCOUNTER — Ambulatory Visit (INDEPENDENT_AMBULATORY_CARE_PROVIDER_SITE_OTHER): Payer: Medicare Other | Admitting: Family Medicine

## 2016-10-13 VITALS — BP 110/70 | HR 80 | Temp 98.5°F | Resp 18 | Ht 65.0 in | Wt 114.0 lb

## 2016-10-13 DIAGNOSIS — F3176 Bipolar disorder, in full remission, most recent episode depressed: Secondary | ICD-10-CM | POA: Diagnosis not present

## 2016-10-13 DIAGNOSIS — R5383 Other fatigue: Secondary | ICD-10-CM | POA: Diagnosis not present

## 2016-10-13 LAB — CBC
HCT: 38 % (ref 35.0–45.0)
Hemoglobin: 12.7 g/dL (ref 11.7–15.5)
MCH: 31.1 pg (ref 27.0–33.0)
MCHC: 33.4 g/dL (ref 32.0–36.0)
MCV: 92.9 fL (ref 80.0–100.0)
MPV: 10.7 fL (ref 7.5–12.5)
Platelets: 272 10*3/uL (ref 140–400)
RBC: 4.09 MIL/uL (ref 3.80–5.10)
RDW: 12.5 % (ref 11.0–15.0)
WBC: 5.6 10*3/uL (ref 3.8–10.8)

## 2016-10-13 LAB — POCT INFLUENZA A/B
Influenza A, POC: NEGATIVE
Influenza B, POC: NEGATIVE

## 2016-10-13 LAB — TSH: TSH: 1.13 mIU/L

## 2016-10-13 NOTE — Progress Notes (Signed)
Chief Complaint  Patient presents with  . Cough  . Shortness of Breath    HPI  Upper Respiratory Infection: Patient complains of symptoms of a URI. Symptoms include cough and rhinorrhea. Onset of symptoms was 9 days ago, gradually worsening since that time. She also c/o achiness, low grade fever, non productive cough and fatigue for the past 5 days since the initial onset of symptoms .  She is drinking plenty of fluids. Evaluation to date: none. Treatment to date: none.  Lab Results  Component Value Date   TSH 0.289 (L) 10/08/2014   Bipolar Disorder She has a well controlled bipolar type 1 disorder and wanted to ensure that her fatigue was not due to her bipolar depression. She reports that she is complaint with her medications prescribed by Psychiatry and has been stable on her current doses.  Past Medical History:  Diagnosis Date  . Allergy   . Anemia   . Anxiety   . Bipolar 1 disorder ([place])   . Chronic post-traumatic stress disorder (PTSD) 02/11/2015  . Colon polyps   . Depression   . Endometriosis   . GERD (gastroesophageal reflux disease)   . Hepatitis   . Hx of adenomatous colonic polyps   . Hyperlipidemia   . Hypothyroidism   . IBS (irritable bowel syndrome)   . Kidney stones   . Osteopenia   . 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 PTSD (post-traumatic stress disorder)   . RLS (restless legs syndrome) 02/11/2015  . Thyroid disease     Current Outpatient Prescriptions  Medication Sig Dispense Refill  . buPROPion (WELLBUTRIN XL) 300 MG 24 hr tablet Take 300 mg by mouth daily.   0  . Cholecalciferol (VITAMIN D PO) Take by mouth.    . dicyclomine (BENTYL) 10 MG capsule TAKE ONE CAPSULE BY MOUTH EVERY 8 HOURS AS NEEDED FOR SPASMS 30 capsule 1  . Esomeprazole Magnesium (NEXIUM 24HR) 20 MG TBEC Take 1 tablet by mouth daily.    Marland Kitchen estradiol (ESTRACE) 1 MG tablet Take 1 tablet (1 mg total) by mouth daily. 30 tablet 12  . gabapentin (NEURONTIN) 300 MG capsule Take 3 capsules (900  mg total) by mouth at bedtime. agitation 90 capsule 0  . levothyroxine (SYNTHROID) 50 MCG tablet Take 50 mcg by mouth. Take 5 days a week    . levothyroxine (SYNTHROID, LEVOTHROID) 75 MCG tablet Take 75 mcg by mouth. Take 2 days a week    . lithium carbonate 300 MG capsule Take 300 mg by mouth daily. Take 1 cap in the evening    . medroxyPROGESTERone (PROVERA) 2.5 MG tablet Take 1 tablet (2.5 mg total) by mouth daily. 30 tablet 11  . propranolol (INDERAL) 10 MG tablet Take 5 mg by mouth at bedtime. Anxiety/high blood pressure     No current facility-administered medications for this visit.     Allergies:  Allergies  Allergen Reactions  . Benzodiazepines Other (See Comments)    paradoxical effect   . Erythromycin Other (See Comments)    vomiting  . Macrolides And Ketolides Other (See Comments)  . Neomycin Swelling    "topical mycins"  . Penicillins Hives    Pt unsure if allergy, due to having seen an horrific sight and hives could be anxiety related.    . Trazodone And Nefazodone     Per pt she experiences a "paradoxical" reaction and shakiness with Trazodone in the past.   . Lamictal [Lamotrigine] Rash    Past Surgical History:  Procedure  Laterality Date  . APPENDECTOMY    . COLONOSCOPY  2013   [facility]  . ECTOPIC PREGNANCY SURGERY    . OTHER SURGICAL HISTORY  1980   endometriosis/ectopic pregnancy    Social History   Social History  . Marital status: Divorced    Spouse name: N/A  . Number of children: 2  . Years of education: college   Social History Main Topics  . Smoking status: Former Smoker    Quit date: 12/27/1986  . Smokeless tobacco: Never Used     Comment: quit smoking 26 years ago  . Alcohol use 0.0 oz/week     Comment: Rare  . Drug use: No     Comment: quit 1980  . Sexual activity: Yes     Comment: 1st intercourse 55 yo-More than 5 partners   Other Topics Concern  . None   Social History Narrative   Caffeine 1 cup daily.     ROS  Objective: Vitals:   10/13/16 1124  BP: 110/70  Pulse: 80  Resp: 18  Temp: 98.5 F (36.9 C)  TempSrc: Oral  SpO2: 100%  Weight: 114 lb (51.7 kg)  Height: 5\' 5"  (1.651 m)    Physical Exam  General: alert, oriented, in NAD Head: normocephalic, atraumatic, no sinus tenderness Eyes: EOM intact, no scleral icterus or conjunctival injection Ears: TM clear bilaterally Throat: no pharyngeal exudate or erythema Lymph: no posterior auricular, submental or cervical lymph adenopathy Heart: normal rate, normal sinus rhythm, no murmurs Lungs: clear to auscultation bilaterally, no wheezing    Assessment and Plan Zoeigh was seen today for cough and shortness of breath.  Diagnoses and all orders for this visit:  Fatigue, unspecified type- flu neg Remaining labs pending -     CBC -     TSH -     POCT Influenza A/B  Bipolar disorder, in full remission, most recent episode depressed ([place]) -  Continue your current medications If all labs are normal then discuss fatigue with Psychiatry    [place]

## 2016-10-13 NOTE — Patient Instructions (Addendum)
IF you received an x-ray today, you will receive an invoice from [facility]. Please contact [facility] at [phone] with questions or concerns regarding your invoice.   IF you received labwork today, you will receive an invoice from [employer]. Please contact [facility] at [phone] with questions or concerns regarding your invoice.   Our billing staff will not be able to assist you with questions regarding bills from these companies.  You will be contacted with the lab results as soon as they are available. The fastest way to get your results is to activate your My Chart account. Instructions are located on the last page of this paperwork. If you have not heard from [place] regarding the results in 2 weeks, please contact this office.    Fatigue Fatigue is feeling tired all of the time, a lack of energy, or a lack of motivation. Occasional or mild fatigue is often a normal response to activity or life in general. However, long-lasting (chronic) or extreme fatigue may indicate an underlying medical condition. HOME CARE INSTRUCTIONS  Watch your fatigue for any changes. The following actions may help to lessen any discomfort you are feeling:  Talk to your health care provider about how much sleep you need each night. Try to get the required amount every night.  Take medicines only as directed by your health care provider.  Eat a healthy and nutritious diet. Ask your health care provider if you need help changing your diet.  Drink enough fluid to keep your urine clear or pale yellow.  Practice ways of relaxing, such as yoga, meditation, massage therapy, or acupuncture.  Exercise regularly.   Change situations that cause you stress. Try to keep your work and personal routine reasonable.  Do not abuse illegal drugs.  Limit alcohol intake to no more than 1 drink per day for nonpregnant women and 2 drinks per day for men. One drink equals  12 ounces of beer, 5 ounces of wine, or 1 ounces of hard liquor.  Take a multivitamin, if directed by your health care provider. SEEK MEDICAL CARE IF:   Your fatigue does not get better.  You have a fever.   You have unintentional weight loss or gain.  You have headaches.   You have difficulty:   Falling asleep.  Sleeping throughout the night.  You feel angry, guilty, anxious, or sad.   You are unable to have a bowel movement (constipation).   You skin is dry.   Your legs or another part of your body is swollen.  SEEK IMMEDIATE MEDICAL CARE IF:   You feel confused.   Your vision is blurry.  You feel faint or pass out.   You have a severe headache.   You have severe abdominal, pelvic, or back pain.   You have chest pain, shortness of breath, or an irregular or fast heartbeat.   You are unable to urinate or you urinate less than normal.   You develop abnormal bleeding, such as bleeding from the rectum, vagina, nose, lungs, or nipples.  You vomit blood.   You have thoughts about harming yourself or committing suicide.   You are worried that you might harm someone else.    This information is not intended to replace advice given to you by your health care provider. Make sure you discuss any questions you have with your health care provider.   Document Released: 10/10/2007 Document Revised: 01/03/2015 Document Reviewed: 04/16/2014 Elsevier Interactive Patient Education 2016  [employer].

## 2016-10-25 DIAGNOSIS — F3181 Bipolar II disorder: Secondary | ICD-10-CM | POA: Diagnosis not present

## 2016-10-28 DIAGNOSIS — F3181 Bipolar II disorder: Secondary | ICD-10-CM | POA: Diagnosis not present

## 2016-11-01 DIAGNOSIS — F3181 Bipolar II disorder: Secondary | ICD-10-CM | POA: Diagnosis not present

## 2016-11-02 DIAGNOSIS — F3181 Bipolar II disorder: Secondary | ICD-10-CM | POA: Diagnosis not present

## 2016-11-03 DIAGNOSIS — Z23 Encounter for immunization: Secondary | ICD-10-CM | POA: Diagnosis not present

## 2016-11-03 DIAGNOSIS — F39 Unspecified mood [affective] disorder: Secondary | ICD-10-CM | POA: Diagnosis not present

## 2016-11-03 DIAGNOSIS — E211 Secondary hyperparathyroidism, not elsewhere classified: Secondary | ICD-10-CM | POA: Diagnosis not present

## 2016-11-03 DIAGNOSIS — E038 Other specified hypothyroidism: Secondary | ICD-10-CM | POA: Diagnosis not present

## 2016-11-03 DIAGNOSIS — R05 Cough: Secondary | ICD-10-CM | POA: Diagnosis not present

## 2016-11-03 DIAGNOSIS — D649 Anemia, unspecified: Secondary | ICD-10-CM | POA: Diagnosis not present

## 2016-11-03 DIAGNOSIS — K219 Gastro-esophageal reflux disease without esophagitis: Secondary | ICD-10-CM | POA: Diagnosis not present

## 2016-11-03 DIAGNOSIS — F319 Bipolar disorder, unspecified: Secondary | ICD-10-CM | POA: Diagnosis not present

## 2016-11-03 DIAGNOSIS — J45909 Unspecified asthma, uncomplicated: Secondary | ICD-10-CM | POA: Diagnosis not present

## 2016-11-03 DIAGNOSIS — Z681 Body mass index (BMI) 19 or less, adult: Secondary | ICD-10-CM | POA: Diagnosis not present

## 2016-11-03 DIAGNOSIS — M8580 Other specified disorders of bone density and structure, unspecified site: Secondary | ICD-10-CM | POA: Diagnosis not present

## 2016-11-03 DIAGNOSIS — Z1389 Encounter for screening for other disorder: Secondary | ICD-10-CM | POA: Diagnosis not present

## 2016-11-04 DIAGNOSIS — R05 Cough: Secondary | ICD-10-CM | POA: Diagnosis not present

## 2016-11-04 DIAGNOSIS — F3181 Bipolar II disorder: Secondary | ICD-10-CM | POA: Diagnosis not present

## 2016-11-08 DIAGNOSIS — F3181 Bipolar II disorder: Secondary | ICD-10-CM | POA: Diagnosis not present

## 2016-11-11 DIAGNOSIS — F3181 Bipolar II disorder: Secondary | ICD-10-CM | POA: Diagnosis not present

## 2016-11-15 DIAGNOSIS — F3181 Bipolar II disorder: Secondary | ICD-10-CM | POA: Diagnosis not present

## 2016-11-17 ENCOUNTER — Encounter (HOSPITAL_COMMUNITY): Payer: Self-pay

## 2016-11-17 ENCOUNTER — Emergency Department (HOSPITAL_COMMUNITY)
Admission: EM | Admit: 2016-11-17 | Discharge: 2016-11-17 | Disposition: A | Discharge status: Home / Self Care | Payer: Medicare Other | Attending: Dermatology | Admitting: Dermatology

## 2016-11-17 DIAGNOSIS — B349 Viral infection, unspecified: Secondary | ICD-10-CM | POA: Diagnosis not present

## 2016-11-17 DIAGNOSIS — J019 Acute sinusitis, unspecified: Secondary | ICD-10-CM | POA: Diagnosis not present

## 2016-11-17 DIAGNOSIS — Z5321 Procedure and treatment not carried out due to patient leaving prior to being seen by health care provider: Secondary | ICD-10-CM | POA: Insufficient documentation

## 2016-11-17 DIAGNOSIS — F319 Bipolar disorder, unspecified: Secondary | ICD-10-CM | POA: Diagnosis not present

## 2016-11-17 DIAGNOSIS — R110 Nausea: Secondary | ICD-10-CM | POA: Diagnosis not present

## 2016-11-17 DIAGNOSIS — Z681 Body mass index (BMI) 19 or less, adult: Secondary | ICD-10-CM | POA: Diagnosis not present

## 2016-11-17 DIAGNOSIS — L089 Local infection of the skin and subcutaneous tissue, unspecified: Secondary | ICD-10-CM | POA: Diagnosis not present

## 2016-11-17 NOTE — ED Notes (Signed)
Pt leaving her husband has to work this am  And she is tired of waiting she will go to an urgent care sometime today

## 2016-11-17 NOTE — ED Triage Notes (Signed)
Pt states she started feeling sick with mild nausea, and body aches; pt states she had Botox 2 day ago and feels like the Botox was making her feel weird; Pt c/o body aches at 2/10 on arrival. Pt a&ox 4 on arrival.

## 2016-11-22 DIAGNOSIS — F3181 Bipolar II disorder: Secondary | ICD-10-CM | POA: Diagnosis not present

## 2016-11-25 DIAGNOSIS — F3181 Bipolar II disorder: Secondary | ICD-10-CM | POA: Diagnosis not present

## 2016-11-29 DIAGNOSIS — F3181 Bipolar II disorder: Secondary | ICD-10-CM | POA: Diagnosis not present

## 2016-12-02 DIAGNOSIS — F3181 Bipolar II disorder: Secondary | ICD-10-CM | POA: Diagnosis not present

## 2016-12-06 DIAGNOSIS — F3181 Bipolar II disorder: Secondary | ICD-10-CM | POA: Diagnosis not present

## 2016-12-09 DIAGNOSIS — F3181 Bipolar II disorder: Secondary | ICD-10-CM | POA: Diagnosis not present

## 2016-12-13 DIAGNOSIS — F3181 Bipolar II disorder: Secondary | ICD-10-CM | POA: Diagnosis not present

## 2016-12-13 DIAGNOSIS — Z681 Body mass index (BMI) 19 or less, adult: Secondary | ICD-10-CM | POA: Diagnosis not present

## 2016-12-13 DIAGNOSIS — R05 Cough: Secondary | ICD-10-CM | POA: Diagnosis not present

## 2016-12-13 DIAGNOSIS — F319 Bipolar disorder, unspecified: Secondary | ICD-10-CM | POA: Diagnosis not present

## 2016-12-13 DIAGNOSIS — J45909 Unspecified asthma, uncomplicated: Secondary | ICD-10-CM | POA: Diagnosis not present

## 2016-12-16 DIAGNOSIS — F3181 Bipolar II disorder: Secondary | ICD-10-CM | POA: Diagnosis not present

## 2016-12-23 DIAGNOSIS — F3181 Bipolar II disorder: Secondary | ICD-10-CM | POA: Diagnosis not present

## 2016-12-24 ENCOUNTER — Other Ambulatory Visit: Payer: Self-pay | Admitting: Gynecology

## 2016-12-24 DIAGNOSIS — Z1231 Encounter for screening mammogram for malignant neoplasm of breast: Secondary | ICD-10-CM

## 2016-12-30 DIAGNOSIS — F3181 Bipolar II disorder: Secondary | ICD-10-CM | POA: Diagnosis not present

## 2017-01-03 DIAGNOSIS — F3181 Bipolar II disorder: Secondary | ICD-10-CM | POA: Diagnosis not present

## 2017-01-03 DIAGNOSIS — R52 Pain, unspecified: Secondary | ICD-10-CM | POA: Diagnosis not present

## 2017-01-03 DIAGNOSIS — R5383 Other fatigue: Secondary | ICD-10-CM | POA: Diagnosis not present

## 2017-01-05 DIAGNOSIS — F3181 Bipolar II disorder: Secondary | ICD-10-CM | POA: Diagnosis not present

## 2017-01-06 DIAGNOSIS — F3181 Bipolar II disorder: Secondary | ICD-10-CM | POA: Diagnosis not present

## 2017-01-08 IMAGING — CR DG HIP (WITH OR WITHOUT PELVIS) 2-3V*R*
2 series · 2 of 2 positions shown · non-contrast
Comparison: None.

CLINICAL DATA: Six week history of persistent mid right-sided
abdominal pain; history of moving heavy optic 6 weeks ago but no
sudden onset of symptoms.

EXAM:
RIGHT HIP (WITH PELVIS) 2-3 VIEWS

[AP]
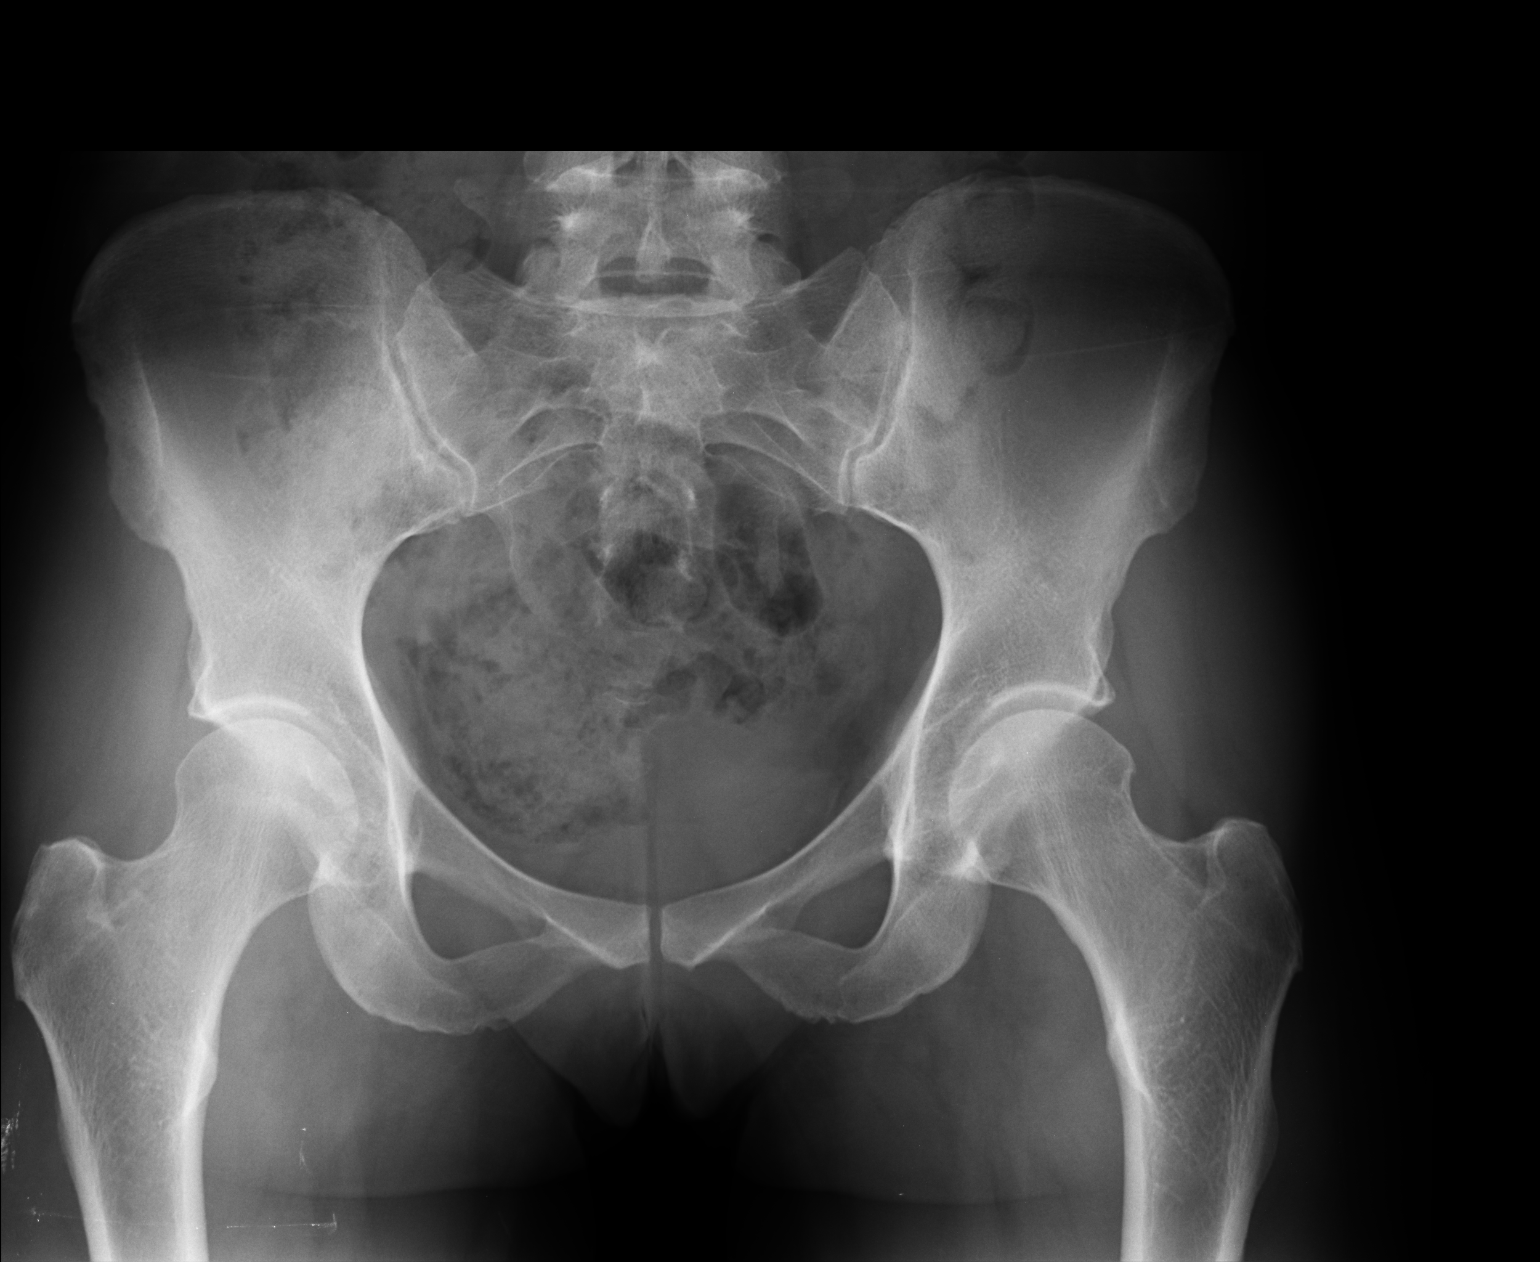

[lateral]
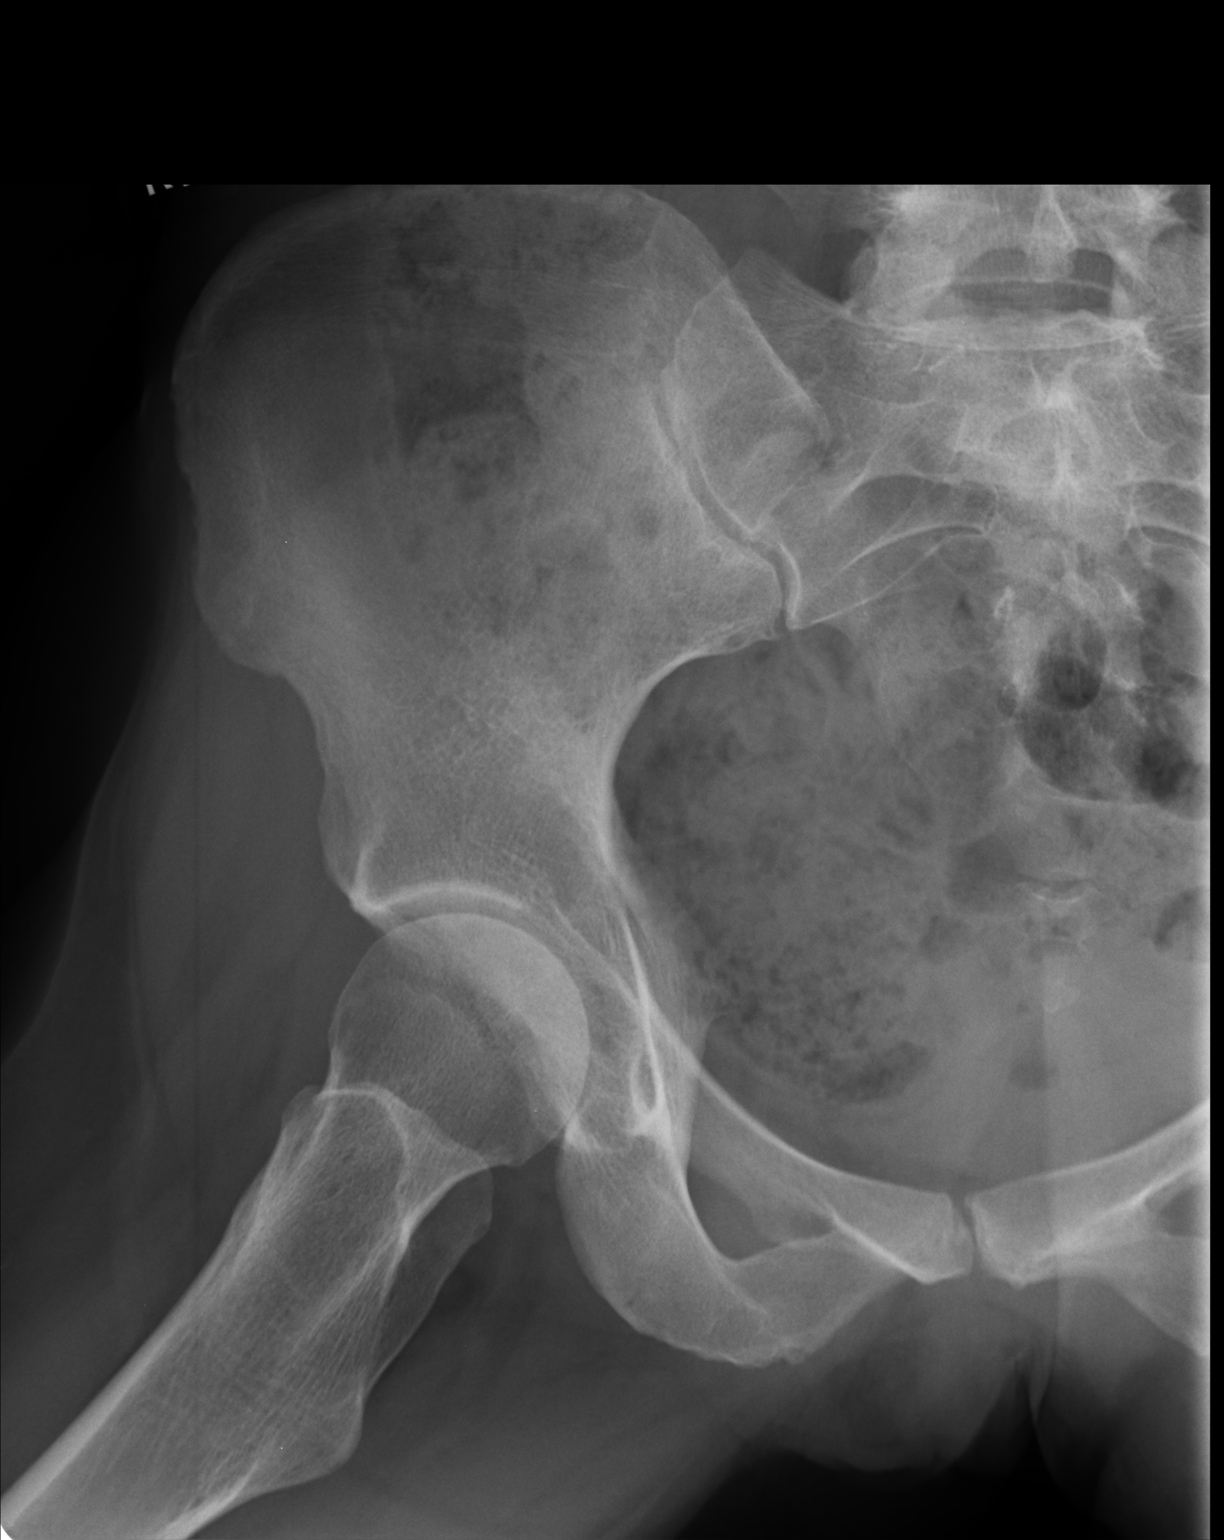

[2 of 2 positions shown; findings below may reference images not displayed]

FINDINGS: The bony pelvis is adequately mineralized. There is no lytic or
blastic lesion. The lower lumbar spine and sacrum are unremarkable.
AP and lateral views of the right hip reveal preservation of the
joint space. The articular surfaces of the joint are smoothly
rounded. The femoral neck and intertrochanteric regions are normal.
There is moderately increased stool burden within the visualized
portions of the colon.
IMPRESSION: There is no acute or chronic bony abnormality of the pelvis or right
hip. Increase colonic stool burden may reflect clinical
constipation.

## 2017-01-08 IMAGING — CR DG TOE 2ND 2+V*R*
3 series · 3 of 3 positions shown · non-contrast
Comparison: None.

CLINICAL DATA: Jammed right second toe.  Initial encounter.

EXAM:
RIGTH SECOND TOE

[AP]
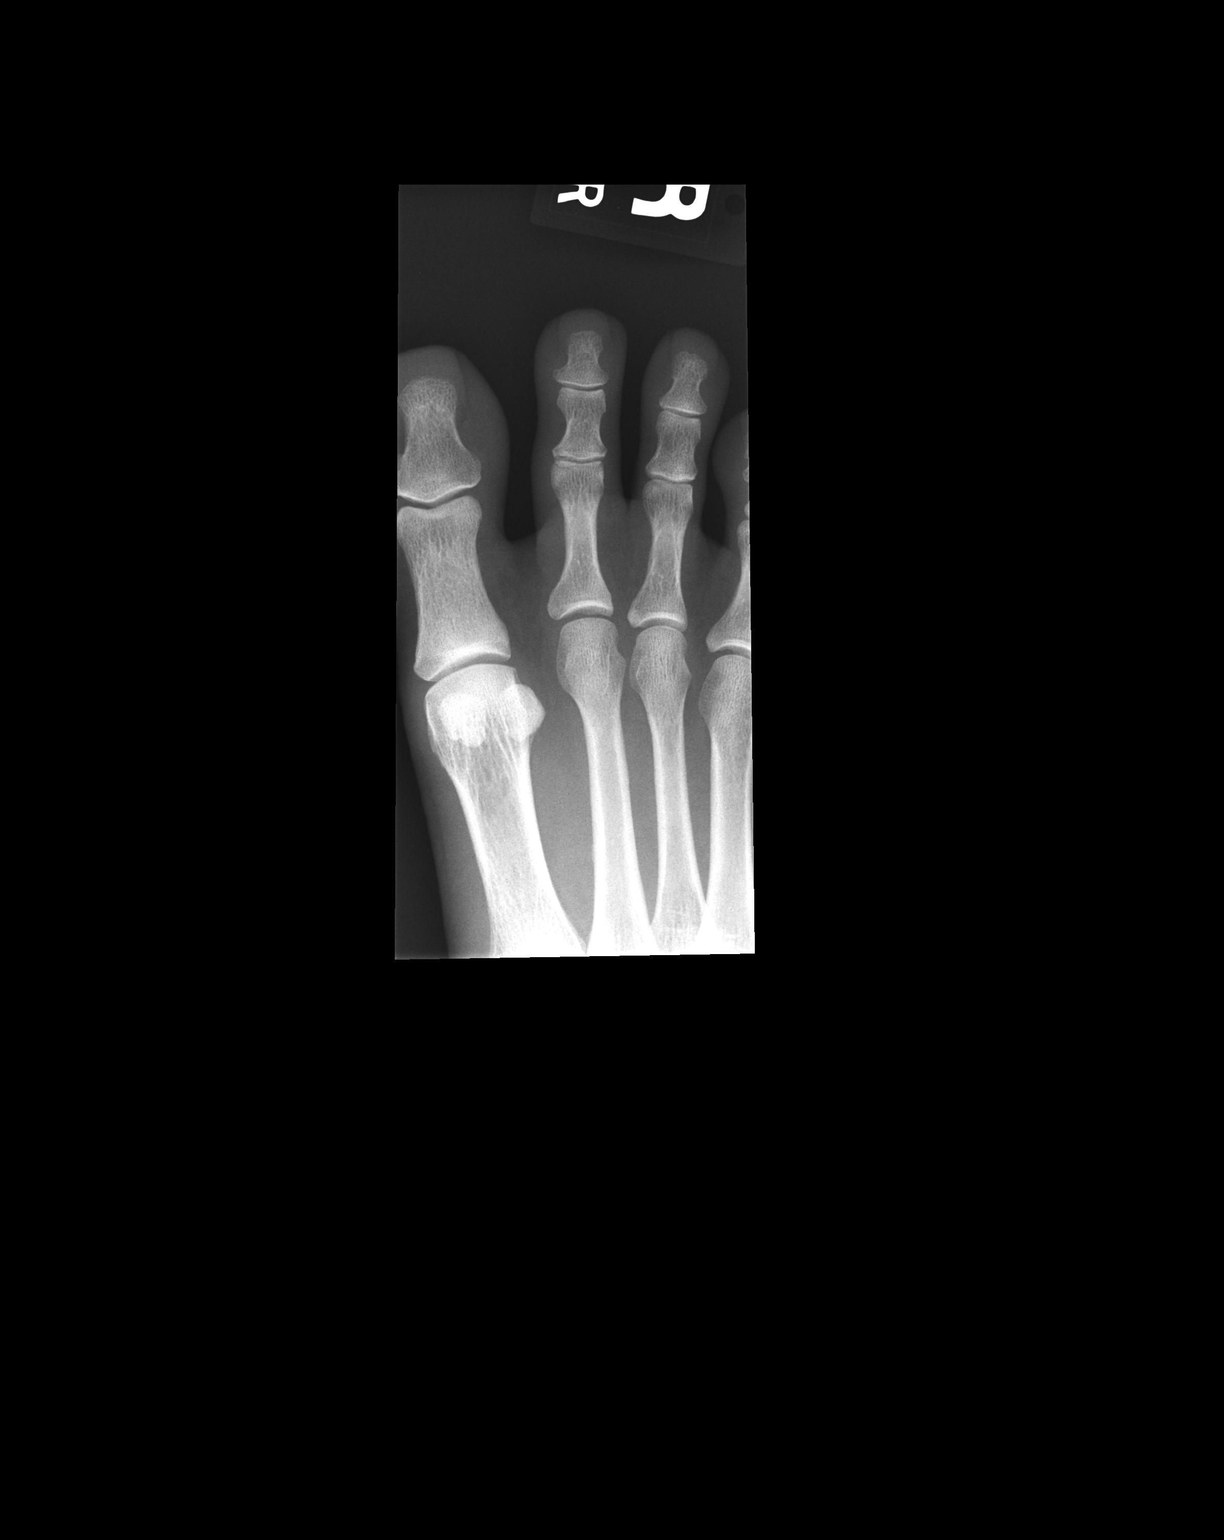

[oblique]
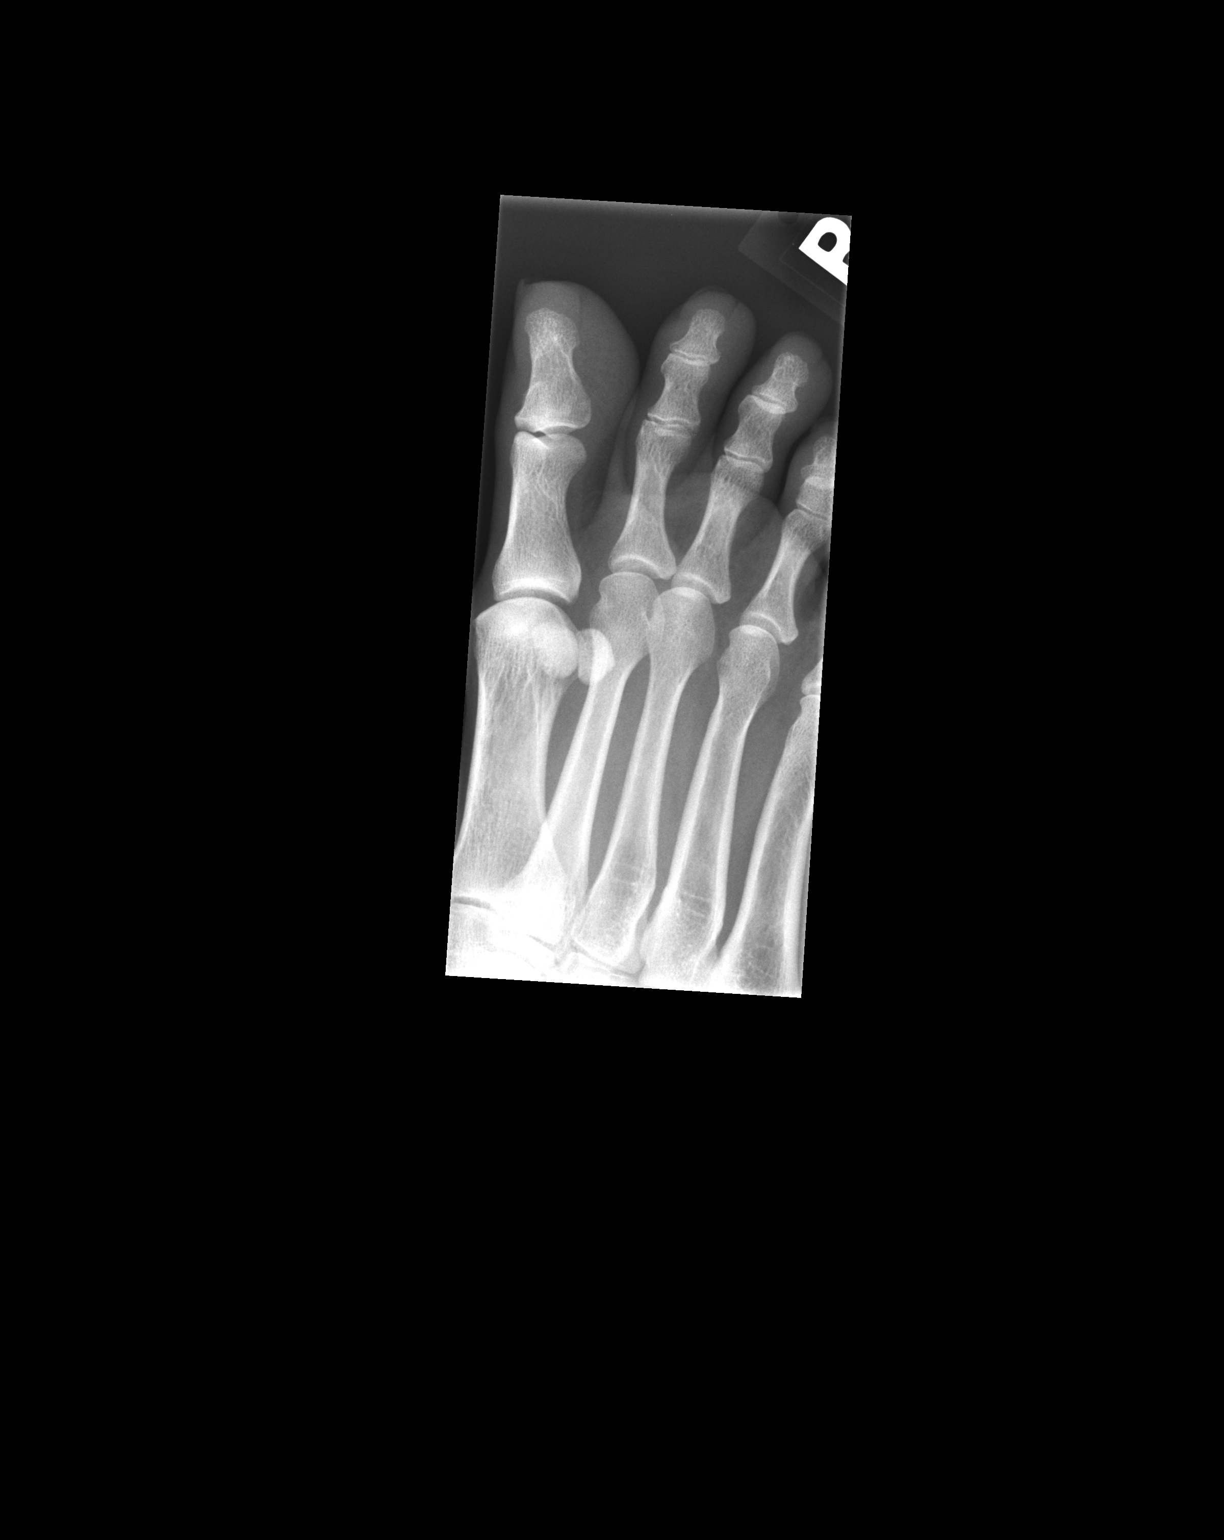

[lateral]
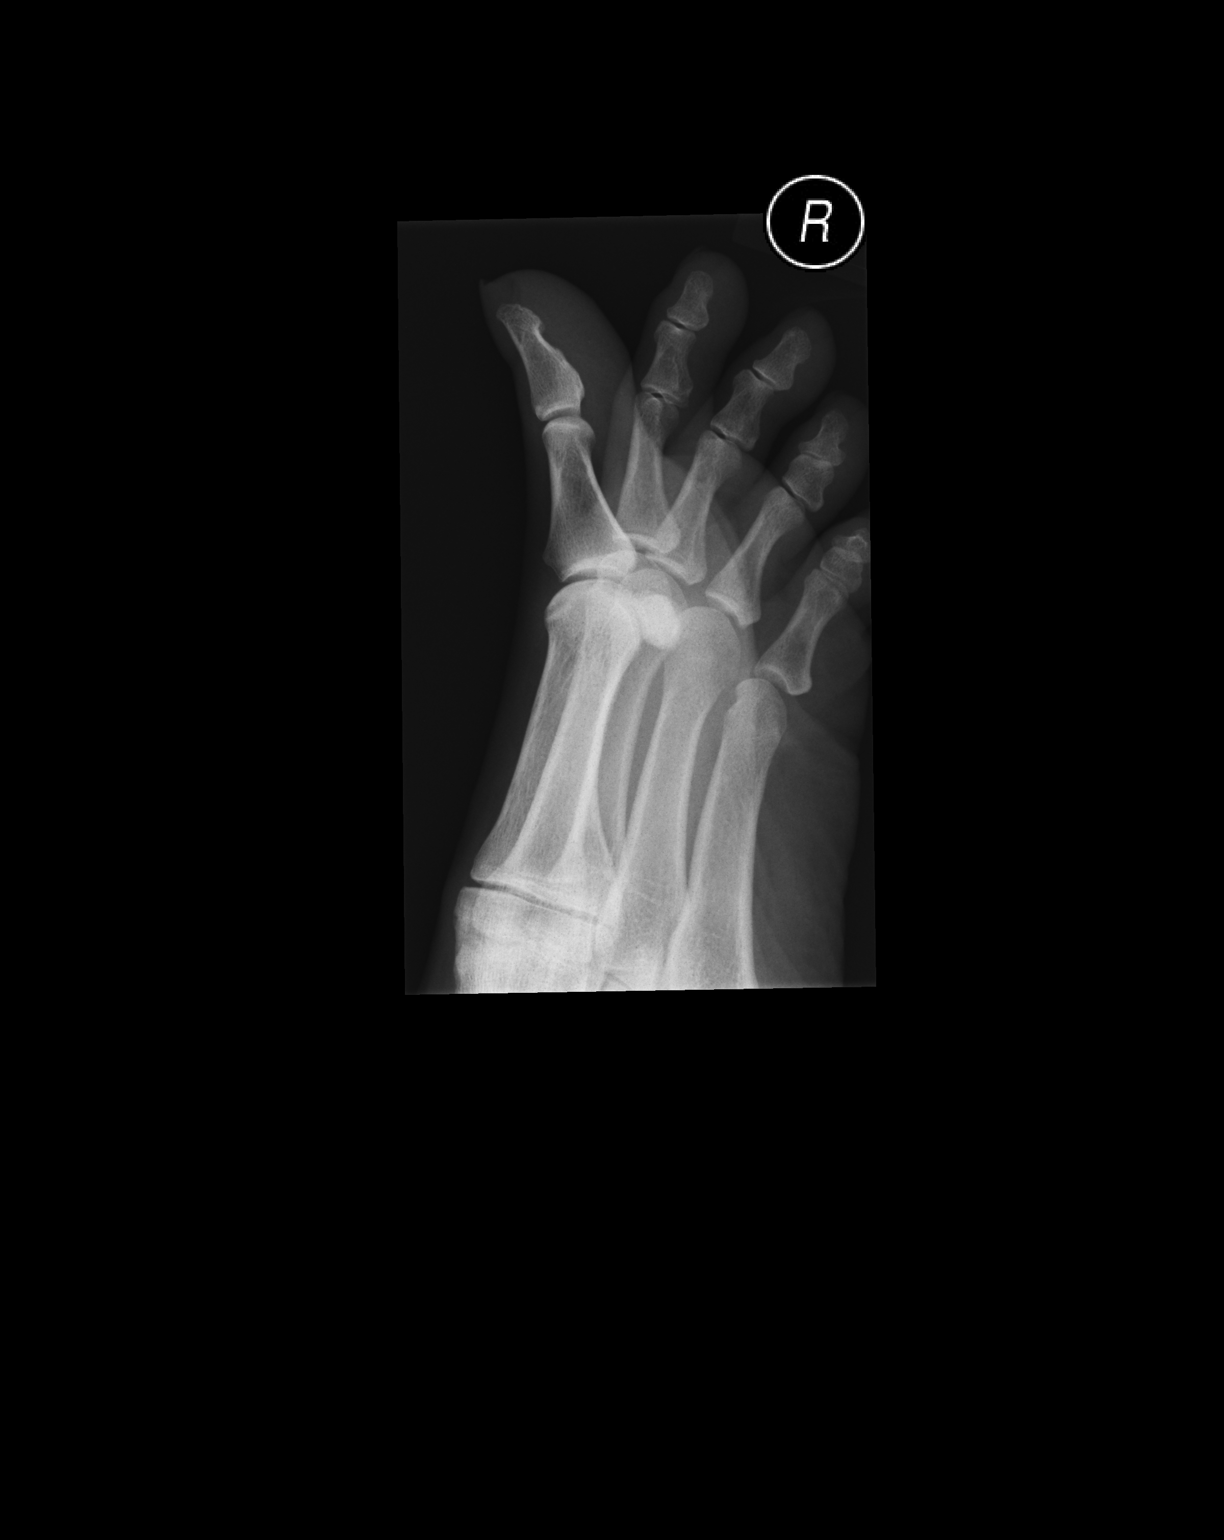

[3 of 3 positions shown; findings below may reference images not displayed]

FINDINGS: There is no evidence of fracture or dislocation. There is no
evidence of arthropathy or other focal bone abnormality. Soft
tissues are unremarkable.
IMPRESSION: Negative.

## 2017-01-12 ENCOUNTER — Ambulatory Visit: Payer: Self-pay

## 2017-01-17 DIAGNOSIS — F3181 Bipolar II disorder: Secondary | ICD-10-CM | POA: Diagnosis not present

## 2017-01-17 DIAGNOSIS — L738 Other specified follicular disorders: Secondary | ICD-10-CM | POA: Diagnosis not present

## 2017-01-17 DIAGNOSIS — L821 Other seborrheic keratosis: Secondary | ICD-10-CM | POA: Diagnosis not present

## 2017-01-17 DIAGNOSIS — D229 Melanocytic nevi, unspecified: Secondary | ICD-10-CM | POA: Diagnosis not present

## 2017-01-20 DIAGNOSIS — F3181 Bipolar II disorder: Secondary | ICD-10-CM | POA: Diagnosis not present

## 2017-01-24 DIAGNOSIS — F3181 Bipolar II disorder: Secondary | ICD-10-CM | POA: Diagnosis not present

## 2017-01-26 DIAGNOSIS — F3181 Bipolar II disorder: Secondary | ICD-10-CM | POA: Diagnosis not present

## 2017-01-27 DIAGNOSIS — F3181 Bipolar II disorder: Secondary | ICD-10-CM | POA: Diagnosis not present

## 2017-01-31 ENCOUNTER — Encounter: Payer: Self-pay | Admitting: Gastroenterology

## 2017-01-31 DIAGNOSIS — F3181 Bipolar II disorder: Secondary | ICD-10-CM | POA: Diagnosis not present

## 2017-02-02 DIAGNOSIS — F3181 Bipolar II disorder: Secondary | ICD-10-CM | POA: Diagnosis not present

## 2017-02-04 ENCOUNTER — Encounter: Payer: Self-pay | Admitting: Gynecology

## 2017-02-04 ENCOUNTER — Ambulatory Visit (INDEPENDENT_AMBULATORY_CARE_PROVIDER_SITE_OTHER): Payer: Medicare Other | Admitting: Gynecology

## 2017-02-04 VITALS — BP 118/70 | Ht 66.0 in | Wt 108.0 lb

## 2017-02-04 DIAGNOSIS — F3181 Bipolar II disorder: Secondary | ICD-10-CM | POA: Diagnosis not present

## 2017-02-04 DIAGNOSIS — Z124 Encounter for screening for malignant neoplasm of cervix: Secondary | ICD-10-CM | POA: Diagnosis not present

## 2017-02-04 DIAGNOSIS — Z01411 Encounter for gynecological examination (general) (routine) with abnormal findings: Secondary | ICD-10-CM | POA: Diagnosis not present

## 2017-02-04 DIAGNOSIS — N841 Polyp of cervix uteri: Secondary | ICD-10-CM

## 2017-02-04 DIAGNOSIS — Z79890 Hormone replacement therapy (postmenopausal): Secondary | ICD-10-CM

## 2017-02-04 DIAGNOSIS — N952 Postmenopausal atrophic vaginitis: Secondary | ICD-10-CM

## 2017-02-04 DIAGNOSIS — Z9189 Other specified personal risk factors, not elsewhere classified: Secondary | ICD-10-CM | POA: Diagnosis not present

## 2017-02-04 MED ORDER — ESTRADIOL 1 MG PO TABS: 1.0000 mg | ORAL_TABLET | Freq: Every day | ORAL | 12 refills | Status: DC

## 2017-02-04 MED ORDER — MEDROXYPROGESTERONE ACETATE 2.5 MG PO TABS: 2.5000 mg | ORAL_TABLET | Freq: Every day | ORAL | 12 refills | Status: DC

## 2017-02-04 NOTE — Addendum Note (Signed)
Addended by: Nelva Nay on: 02/04/2017 10:56 AM   Modules accepted: Orders

## 2017-02-04 NOTE — Patient Instructions (Signed)
Office will call with pathology report  You may obtain a copy of any labs that were done today by logging onto MyChart as outlined in the instructions provided with your AVS (after visit summary). The office will not call with normal lab results but certainly if there are any significant abnormalities then we will contact you.   Health Maintenance Adopting a healthy lifestyle and getting preventive care can go a long way to promote health and wellness. Talk with your health care provider about what schedule of regular examinations is right for you. This is a good chance for you to check in with your provider about disease prevention and staying healthy. In between checkups, there are plenty of things you can do on your own. Experts have done a lot of research about which lifestyle changes and preventive measures are most likely to keep you healthy. Ask your health care provider for more information. WEIGHT AND DIET  Eat a healthy diet  Be sure to include plenty of vegetables, fruits, low-fat dairy products, and lean protein.  Do not eat a lot of foods high in solid fats, added sugars, or salt.  Get regular exercise. This is one of the most important things you can do for your health.  Most adults should exercise for at least 150 minutes each week. The exercise should increase your heart rate and make you sweat (moderate-intensity exercise).  Most adults should also do strengthening exercises at least twice a week. This is in addition to the moderate-intensity exercise.  Maintain a healthy weight  Body mass index (BMI) is a measurement that can be used to identify possible weight problems. It estimates body fat based on height and weight. Your health care provider can help determine your BMI and help you achieve or maintain a healthy weight.  For females 64 years of age and older:   A BMI below 18.5 is considered underweight.  A BMI of 18.5 to 24.9 is normal.  A BMI of 25 to 29.9 is  considered overweight.  A BMI of 30 and above is considered obese.  Watch levels of cholesterol and blood lipids  You should start having your blood tested for lipids and cholesterol at 58 years of age, then have this test every 5 years.  You may need to have your cholesterol levels checked more often if:  Your lipid or cholesterol levels are high.  You are older than 58 years of age.  You are at high risk for heart disease.  CANCER SCREENING   Lung Cancer  Lung cancer screening is recommended for adults 61-1 years old who are at high risk for lung cancer because of a history of smoking.  A yearly low-dose CT scan of the lungs is recommended for people who:  Currently smoke.  Have quit within the past 15 years.  Have at least a 30-pack-year history of smoking. A pack year is smoking an average of one pack of cigarettes a day for 1 year.  Yearly screening should continue until it has been 15 years since you quit.  Yearly screening should stop if you develop a health problem that would prevent you from having lung cancer treatment.  Breast Cancer  Practice breast self-awareness. This means understanding how your breasts normally appear and feel.  It also means doing regular breast self-exams. Let your health care provider know about any changes, no matter how small.  If you are in your 20s or 30s, you should have a clinical breast exam (CBE)  by a health care provider every 1-3 years as part of a regular health exam.  If you are 22 or older, have a CBE every year. Also consider having a breast X-ray (mammogram) every year.  If you have a family history of breast cancer, talk to your health care provider about genetic screening.  If you are at high risk for breast cancer, talk to your health care provider about having an MRI and a mammogram every year.  Breast cancer gene (BRCA) assessment is recommended for women who have family members with BRCA-related cancers.  BRCA-related cancers include:  Breast.  Ovarian.  Tubal.  Peritoneal cancers.  Results of the assessment will determine the need for genetic counseling and BRCA1 and BRCA2 testing. Cervical Cancer Routine pelvic examinations to screen for cervical cancer are no longer recommended for nonpregnant women who are considered low risk for cancer of the pelvic organs (ovaries, uterus, and vagina) and who do not have symptoms. A pelvic examination may be necessary if you have symptoms including those associated with pelvic infections. Ask your health care provider if a screening pelvic exam is right for you.   The Pap test is the screening test for cervical cancer for women who are considered at risk.  If you had a hysterectomy for a problem that was not cancer or a condition that could lead to cancer, then you no longer need Pap tests.  If you are older than 65 years, and you have had normal Pap tests for the past 10 years, you no longer need to have Pap tests.  If you have had past treatment for cervical cancer or a condition that could lead to cancer, you need Pap tests and screening for cancer for at least 20 years after your treatment.  If you no longer get a Pap test, assess your risk factors if they change (such as having a new sexual partner). This can affect whether you should start being screened again.  Some women have medical problems that increase their chance of getting cervical cancer. If this is the case for you, your health care provider may recommend more frequent screening and Pap tests.  The human papillomavirus (HPV) test is another test that may be used for cervical cancer screening. The HPV test looks for the virus that can cause cell changes in the cervix. The cells collected during the Pap test can be tested for HPV.  The HPV test can be used to screen women 39 years of age and older. Getting tested for HPV can extend the interval between normal Pap tests from three to  five years.  An HPV test also should be used to screen women of any age who have unclear Pap test results.  After 58 years of age, women should have HPV testing as often as Pap tests.  Colorectal Cancer  This type of cancer can be detected and often prevented.  Routine colorectal cancer screening usually begins at 58 years of age and continues through 59 years of age.  Your health care provider may recommend screening at an earlier age if you have risk factors for colon cancer.  Your health care provider may also recommend using home test kits to check for hidden blood in the stool.  A small camera at the end of a tube can be used to examine your colon directly (sigmoidoscopy or colonoscopy). This is done to check for the earliest forms of colorectal cancer.  Routine screening usually begins at age 58.  Direct  examination of the colon should be repeated every 5-10 years through 58 years of age. However, you may need to be screened more often if early forms of precancerous polyps or small growths are found. Skin Cancer  Check your skin from head to toe regularly.  Tell your health care provider about any new moles or changes in moles, especially if there is a change in a mole's shape or color.  Also tell your health care provider if you have a mole that is larger than the size of a pencil eraser.  Always use sunscreen. Apply sunscreen liberally and repeatedly throughout the day.  Protect yourself by wearing long sleeves, pants, a wide-brimmed hat, and sunglasses whenever you are outside. HEART DISEASE, DIABETES, AND HIGH BLOOD PRESSURE   Have your blood pressure checked at least every 1-2 years. High blood pressure causes heart disease and increases the risk of stroke.  If you are between 26 years and 34 years old, ask your health care provider if you should take aspirin to prevent strokes.  Have regular diabetes screenings. This involves taking a blood sample to check your  fasting blood sugar level.  If you are at a normal weight and have a low risk for diabetes, have this test once every three years after 58 years of age.  If you are overweight and have a high risk for diabetes, consider being tested at a younger age or more often. PREVENTING INFECTION  Hepatitis B  If you have a higher risk for hepatitis B, you should be screened for this virus. You are considered at high risk for hepatitis B if:  You were born in a country where hepatitis B is common. Ask your health care provider which countries are considered high risk.  Your parents were born in a high-risk country, and you have not been immunized against hepatitis B (hepatitis B vaccine).  You have HIV or AIDS.  You use needles to inject street drugs.  You live with someone who has hepatitis B.  You have had sex with someone who has hepatitis B.  You get hemodialysis treatment.  You take certain medicines for conditions, including cancer, organ transplantation, and autoimmune conditions. Hepatitis C  Blood testing is recommended for:  Everyone born from [birth year] through 1967.  Anyone with known risk factors for hepatitis C. Sexually transmitted infections (STIs)  You should be screened for sexually transmitted infections (STIs) including gonorrhea and chlamydia if:  You are sexually active and are younger than 58 years of age.  You are older than 58 years of age and your health care provider tells you that you are at risk for this type of infection.  Your sexual activity has changed since you were last screened and you are at an increased risk for chlamydia or gonorrhea. Ask your health care provider if you are at risk.  If you do not have HIV, but are at risk, it may be recommended that you take a prescription medicine daily to prevent HIV infection. This is called pre-exposure prophylaxis (PrEP). You are considered at risk if:  You are sexually active and do not regularly use condoms or  know the HIV status of your partner(s).  You take drugs by injection.  You are sexually active with a partner who has HIV. Talk with your health care provider about whether you are at high risk of being infected with HIV. If you choose to begin PrEP, you should first be tested for HIV. You should then be tested  every 3 months for as long as you are taking PrEP.  PREGNANCY   If you are premenopausal and you may become pregnant, ask your health care provider about preconception counseling.  If you may become pregnant, take 400 to 800 micrograms (mcg) of folic acid every day.  If you want to prevent pregnancy, talk to your health care provider about birth control (contraception). OSTEOPOROSIS AND MENOPAUSE   Osteoporosis is a disease in which the bones lose minerals and strength with aging. This can result in serious bone fractures. Your risk for osteoporosis can be identified using a bone density scan.  If you are 67 years of age or older, or if you are at risk for osteoporosis and fractures, ask your health care provider if you should be screened.  Ask your health care provider whether you should take a calcium or vitamin D supplement to lower your risk for osteoporosis.  Menopause may have certain physical symptoms and risks.  Hormone replacement therapy may reduce some of these symptoms and risks. Talk to your health care provider about whether hormone replacement therapy is right for you.  HOME CARE INSTRUCTIONS   Schedule regular health, dental, and eye exams.  Stay current with your immunizations.   Do not use any tobacco products including cigarettes, chewing tobacco, or electronic cigarettes.  If you are pregnant, do not drink alcohol.  If you are breastfeeding, limit how much and how often you drink alcohol.  Limit alcohol intake to no more than 1 drink per day for nonpregnant women. One drink equals 12 ounces of beer, 5 ounces of wine, or 1 ounces of hard liquor.  Do  not use street drugs.  Do not share needles.  Ask your health care provider for help if you need support or information about quitting drugs.  Tell your health care provider if you often feel depressed.  Tell your health care provider if you have ever been abused or do not feel safe at home. Document Released: 06/28/2011 Document Revised: 04/29/2014 Document Reviewed: 11/14/2013 [facility] Patient Information 2015 [place], [place]. This information is not intended to replace advice given to you by your health care provider. Make sure you discuss any questions you have with your health care provider.

## 2017-02-04 NOTE — Progress Notes (Signed)
    Anne Little 1961-12-19 [id number]        56 y.o.  [id number] for breast and pelvic exam   Past medical history,surgical history, problem list, medications, allergies, family history and social history were all reviewed and documented as reviewed in the EPIC chart.  ROS:  Performed with pertinent positives and negatives included in the history, assessment and plan.   Additional significant findings :  None   Exam: Anne Little assistant Vitals:   02/04/17 0935  BP: 118/70  Weight: 108 lb (49 kg)  Height: 5\' 6"  (1.676 m)   Body mass index is 17.43 kg/m.  General appearance:  Normal affect, orientation and appearance. Skin: Grossly normal HEENT: Without gross lesions.  No cervical or supraclavicular adenopathy. Thyroid normal.  Lungs:  Clear without wheezing, rales or rhonchi Cardiac: RR, without RMG Abdominal:  Soft, nontender, without masses, guarding, rebound, organomegaly or hernia Breasts:  Examined lying and sitting without masses, retractions, discharge or axillary adenopathy. Pelvic:  Ext, BUS, Vagina with atrophic changes  Cervix endocervical polyp noted. Pap smear done. Following this the endocervical polyp was biopsied off and sent to pathology.  Uterus anteverted, normal size, shape and contour, midline and mobile nontender   Adnexa without masses or tenderness    Anus and perineum normal   Rectovaginal normal sphincter tone without palpated masses or tenderness.    Assessment/Plan:  56 y.o. [id number] female for breast and pelvic exam.   1. Postmenopausal/atrophic genital changes/HRT. Continues on estradiol 0.5 mg and Provera 2.5 mg. Had switched her to Prometrium but due to financial issues she went back on Provera which was cheaper. She's done no bleeding. I reviewed the most current 2017 NAMS guidelines on HRT. Risks to include thrombosis such as stroke heart attack DVT and breast cancer reviewed. Benefits to include symptom relief and possible cardiovascular/bone  health and started early noting her history of osteopenia also discussed. At this point the patient strongly wants to continue understanding and accepting the risks. Refill 1 year provided. Patient does report any bleeding. 2. Endocervical polyp. Classic in appearance. Biopsied off and sent to pathology. Patient will follow up results but assuming negative then follow expectantly. 3. Osteopenia. DEXA 2015 T score -1.6. Is followed by Dr. Chalmers Cater with hyperparathyroidism. Has appointment to see her in April and will continue to follow up with her reference to bone health. 4. Mammography 09/2015. Patient has mammogram scheduled next week and will follow up for this. SBE monthly reviewed. 5. Pap smear 09/2014. Pap smear repeated today. No history of significant abnormal Pap smears previously. 6. Colonoscopy 2013. Repeat at their recommended interval. 7. Health maintenance. No routine lab work done as patient does this elsewhere. Follow up 1 year, sooner as needed.   Anastasio Auerbach MD, 10:48 AM 02/04/2017

## 2017-02-07 ENCOUNTER — Ambulatory Visit
Admission: RE | Admit: 2017-02-07 | Discharge: 2017-02-07 | Disposition: A | Discharge status: Home / Self Care | Payer: Medicare Other | Source: Ambulatory Visit | Attending: Gynecology | Admitting: Gynecology

## 2017-02-07 DIAGNOSIS — F3181 Bipolar II disorder: Secondary | ICD-10-CM | POA: Diagnosis not present

## 2017-02-07 DIAGNOSIS — Z1231 Encounter for screening mammogram for malignant neoplasm of breast: Secondary | ICD-10-CM | POA: Diagnosis not present

## 2017-02-08 LAB — PAP IG W/ RFLX HPV ASCU

## 2017-02-08 LAB — PATHOLOGY

## 2017-02-09 ENCOUNTER — Encounter: Payer: Self-pay | Admitting: Gastroenterology

## 2017-02-09 ENCOUNTER — Encounter: Payer: Self-pay | Admitting: Gynecology

## 2017-02-09 DIAGNOSIS — F3181 Bipolar II disorder: Secondary | ICD-10-CM | POA: Diagnosis not present

## 2017-02-10 DIAGNOSIS — F3181 Bipolar II disorder: Secondary | ICD-10-CM | POA: Diagnosis not present

## 2017-02-14 ENCOUNTER — Other Ambulatory Visit: Payer: Self-pay | Admitting: Gynecology

## 2017-02-14 DIAGNOSIS — F3181 Bipolar II disorder: Secondary | ICD-10-CM | POA: Diagnosis not present

## 2017-02-17 DIAGNOSIS — F3181 Bipolar II disorder: Secondary | ICD-10-CM | POA: Diagnosis not present

## 2017-02-21 DIAGNOSIS — F3181 Bipolar II disorder: Secondary | ICD-10-CM | POA: Diagnosis not present

## 2017-02-24 DIAGNOSIS — F3181 Bipolar II disorder: Secondary | ICD-10-CM | POA: Diagnosis not present

## 2017-03-03 DIAGNOSIS — F3181 Bipolar II disorder: Secondary | ICD-10-CM | POA: Diagnosis not present

## 2017-03-10 DIAGNOSIS — F3181 Bipolar II disorder: Secondary | ICD-10-CM | POA: Diagnosis not present

## 2017-03-14 DIAGNOSIS — F3181 Bipolar II disorder: Secondary | ICD-10-CM | POA: Diagnosis not present

## 2017-03-21 DIAGNOSIS — F3181 Bipolar II disorder: Secondary | ICD-10-CM | POA: Diagnosis not present

## 2017-03-22 ENCOUNTER — Ambulatory Visit (AMBULATORY_SURGERY_CENTER): Payer: Self-pay | Admitting: *Deleted

## 2017-03-22 VITALS — Ht 66.0 in | Wt 111.0 lb

## 2017-03-22 DIAGNOSIS — Z86010 Personal history of colonic polyps: Secondary | ICD-10-CM

## 2017-03-22 MED ORDER — NA SULFATE-K SULFATE-MG SULF 17.5-3.13-1.6 GM/177ML PO SOLN: 1.0000 | Freq: Once | ORAL | 0 refills | Status: AC

## 2017-03-22 NOTE — Progress Notes (Signed)
Familial history lynch syndrome No egg or soy allergy known to patient  No issues with past sedation with any surgeries  or procedures, no intubation problems  No diet pills per patient No home 02 use per patient  No blood thinners per patient  Pt states  issues with constipation - she uses stool softener daily and diet changes help- if real issues will use OTC laxative  No A fib or A flutter

## 2017-03-28 DIAGNOSIS — F3181 Bipolar II disorder: Secondary | ICD-10-CM | POA: Diagnosis not present

## 2017-03-31 DIAGNOSIS — F3181 Bipolar II disorder: Secondary | ICD-10-CM | POA: Diagnosis not present

## 2017-04-04 DIAGNOSIS — F3181 Bipolar II disorder: Secondary | ICD-10-CM | POA: Diagnosis not present

## 2017-04-05 ENCOUNTER — Ambulatory Visit (AMBULATORY_SURGERY_CENTER): Payer: Medicare Other | Admitting: Gastroenterology

## 2017-04-05 ENCOUNTER — Encounter: Payer: Self-pay | Admitting: Gastroenterology

## 2017-04-05 VITALS — BP 115/54 | HR 61 | Temp 98.6°F | Resp 11 | Ht 66.0 in | Wt 108.0 lb

## 2017-04-05 DIAGNOSIS — Z86010 Personal history of colonic polyps: Secondary | ICD-10-CM | POA: Diagnosis not present

## 2017-04-05 MED ORDER — SODIUM CHLORIDE 0.9 % IV SOLN: 500.0000 mL | INTRAVENOUS | Status: DC

## 2017-04-05 NOTE — Patient Instructions (Signed)
Impression/recommendations:  Internal hemorrhoids (handout given)  YOU HAD AN ENDOSCOPIC PROCEDURE TODAY AT [facility]:   Refer to the procedure report that was given to you for any specific questions about what was found during the examination.  If the procedure report does not answer your questions, please call your gastroenterologist to clarify.  If you requested that your care partner not be given the details of your procedure findings, then the procedure report has been included in a sealed envelope for you to review at your convenience later.  YOU SHOULD EXPECT: Some feelings of bloating in the abdomen. Passage of more gas than usual.  Walking can help get rid of the air that was put into your GI tract during the procedure and reduce the bloating. If you had a lower endoscopy (such as a colonoscopy or flexible sigmoidoscopy) you may notice spotting of blood in your stool or on the toilet paper. If you underwent a bowel prep for your procedure, you may not have a normal bowel movement for a few days.  Please Note:  You might notice some irritation and congestion in your nose or some drainage.  This is from the oxygen used during your procedure.  There is no need for concern and it should clear up in a day or so.  SYMPTOMS TO REPORT IMMEDIATELY:   Following lower endoscopy (colonoscopy or flexible sigmoidoscopy):  Excessive amounts of blood in the stool  Significant tenderness or worsening of abdominal pains  Swelling of the abdomen that is new, acute  Fever of 100F or higher  For urgent or emergent issues, a gastroenterologist can be reached at any hour by calling [phone].   DIET:  We do recommend a small meal at first, but then you may proceed to your regular diet.  Drink plenty of fluids but you should avoid alcoholic beverages for 24 hours.  ACTIVITY:  You should plan to take it easy for the rest of today and you should NOT DRIVE or use heavy machinery until  tomorrow (because of the sedation medicines used during the test).    FOLLOW UP: Our staff will call the number listed on your records the next business day following your procedure to check on you and address any questions or concerns that you may have regarding the information given to you following your procedure. If we do not reach you, we will leave a message.  However, if you are feeling well and you are not experiencing any problems, there is no need to return our call.  We will assume that you have returned to your regular daily activities without incident.  If any biopsies were taken you will be contacted by phone or by letter within the next 1-3 weeks.  Please call us at [phone] if you have not heard about the biopsies in 3 weeks.    SIGNATURES/CONFIDENTIALITY: You and/or your care partner have signed paperwork which will be entered into your electronic medical record.  These signatures attest to the fact that that the information above on your After Visit Summary has been reviewed and is understood.  Full responsibility of the confidentiality of this discharge information lies with you and/or your care-partner.

## 2017-04-05 NOTE — Progress Notes (Signed)
A/ox3 pleased with MAC, report to Tracy RN 

## 2017-04-05 NOTE — Op Note (Signed)
[facility] Patient Name: Anne Little Procedure Date: 04/05/2017 8:34 AM MRN: [id number] Endoscopist: Remo Lipps P. Antonia Jicha MD, MD Age: 56 Referring MD:  Date of Birth: [birth year] Gender: Female Account #: [id number] Procedure:                Colonoscopy Indications:              Surveillance: Personal history of small adenomatous                            polyp on last colonoscopy 5 years ago Medicines:                Monitored Anesthesia Care Procedure:                Pre-Anesthesia Assessment:                           - Prior to the procedure, a History and Physical                            was performed, and patient medications and                            allergies were reviewed. The patient's tolerance of                            previous anesthesia was also reviewed. The risks                            and benefits of the procedure and the sedation                            options and risks were discussed with the patient.                            All questions were answered, and informed consent                            was obtained. Prior Anticoagulants: The patient has                            taken no previous anticoagulant or antiplatelet                            agents. ASA Grade Assessment: II - A patient with                            mild systemic disease. After reviewing the risks                            and benefits, the patient was deemed in                            satisfactory condition to undergo the procedure.  After obtaining informed consent, the colonoscope                            was passed under direct vision. Throughout the                            procedure, the patient's blood pressure, pulse, and                            oxygen saturations were monitored continuously. The                            Model [id number]-[id number] scope was introduced                            through the  anus and advanced to the the terminal                            ileum, with identification of the appendiceal                            orifice and IC valve. The colonoscopy was performed                            without difficulty. The patient tolerated the                            procedure well. The quality of the bowel                            preparation was good. The terminal ileum, ileocecal                            valve, appendiceal orifice, and rectum were                            photographed. Scope In: 8:42:57 AM Scope Out: 8:59:02 AM Scope Withdrawal Time: 0 hours 12 minutes 10 seconds  Total Procedure Duration: 0 hours 16 minutes 5 seconds  Findings:                 The perianal and digital rectal examinations were                            normal.                           The terminal ileum appeared normal.                           Internal hemorrhoids were found during                            retroflexion. The hemorrhoids were small.  The exam was otherwise without abnormality. Complications:            No immediate complications. Estimated blood loss:                            None. Estimated Blood Loss:     Estimated blood loss: none. Impression:               - The examined portion of the ileum was normal.                           - Internal hemorrhoids.                           - The examination was otherwise normal. No polyps.                           - No specimens collected. Recommendation:           - Patient has a contact number available for                            emergencies. The signs and symptoms of potential                            delayed complications were discussed with the                            patient. Return to normal activities tomorrow.                            Written discharge instructions were provided to the                            patient.                           - Resume  previous diet.                           - Continue present medications.                           - Repeat colonoscopy in 10 years for screening                            purposes. Remo Lipps P. Kelliann Pendergraph MD, MD 04/05/2017 9:03:30 AM This report has been signed electronically.

## 2017-04-06 ENCOUNTER — Telehealth: Payer: Self-pay

## 2017-04-06 NOTE — Telephone Encounter (Signed)
  Follow up Call-  Call back number 04/05/2017 06/25/2016  Post procedure Call Back phone  # [phone]  Permission to leave phone message Yes Yes  Some recent data might be hidden     Patient questions:  Do you have a fever, pain , or abdominal swelling? No. Pain Score  0 *  Have you tolerated food without any problems? Yes.    Have you been able to return to your normal activities? Yes.    Do you have any questions about your discharge instructions: Diet   No. Medications  No. Follow up visit  No.  Do you have questions or concerns about your Care? No.  Actions: * If pain score is 4 or above: No action needed, pain <4.

## 2017-04-07 DIAGNOSIS — F3181 Bipolar II disorder: Secondary | ICD-10-CM | POA: Diagnosis not present

## 2017-04-11 DIAGNOSIS — F3181 Bipolar II disorder: Secondary | ICD-10-CM | POA: Diagnosis not present

## 2017-04-14 DIAGNOSIS — F3181 Bipolar II disorder: Secondary | ICD-10-CM | POA: Diagnosis not present

## 2017-04-15 DIAGNOSIS — E559 Vitamin D deficiency, unspecified: Secondary | ICD-10-CM | POA: Diagnosis not present

## 2017-04-15 DIAGNOSIS — E039 Hypothyroidism, unspecified: Secondary | ICD-10-CM | POA: Diagnosis not present

## 2017-04-15 DIAGNOSIS — E213 Hyperparathyroidism, unspecified: Secondary | ICD-10-CM | POA: Diagnosis not present

## 2017-04-18 DIAGNOSIS — F3181 Bipolar II disorder: Secondary | ICD-10-CM | POA: Diagnosis not present

## 2017-04-21 DIAGNOSIS — F3181 Bipolar II disorder: Secondary | ICD-10-CM | POA: Diagnosis not present

## 2017-04-21 DIAGNOSIS — E559 Vitamin D deficiency, unspecified: Secondary | ICD-10-CM | POA: Diagnosis not present

## 2017-04-21 DIAGNOSIS — E039 Hypothyroidism, unspecified: Secondary | ICD-10-CM | POA: Diagnosis not present

## 2017-04-21 DIAGNOSIS — E213 Hyperparathyroidism, unspecified: Secondary | ICD-10-CM | POA: Diagnosis not present

## 2017-04-21 DIAGNOSIS — M899 Disorder of bone, unspecified: Secondary | ICD-10-CM | POA: Diagnosis not present

## 2017-04-22 DIAGNOSIS — Z79899 Other long term (current) drug therapy: Secondary | ICD-10-CM | POA: Diagnosis not present

## 2017-04-22 DIAGNOSIS — E038 Other specified hypothyroidism: Secondary | ICD-10-CM | POA: Diagnosis not present

## 2017-04-22 DIAGNOSIS — F319 Bipolar disorder, unspecified: Secondary | ICD-10-CM | POA: Diagnosis not present

## 2017-04-27 ENCOUNTER — Other Ambulatory Visit: Payer: Self-pay

## 2017-04-27 MED ORDER — ESTRADIOL 1 MG PO TABS: 1.0000 mg | ORAL_TABLET | Freq: Every day | ORAL | 2 refills | Status: DC

## 2017-04-28 DIAGNOSIS — F3170 Bipolar disorder, currently in remission, most recent episode unspecified: Secondary | ICD-10-CM | POA: Diagnosis not present

## 2017-04-28 DIAGNOSIS — Z0000 Encounter for general adult medical examination without abnormal findings: Secondary | ICD-10-CM | POA: Diagnosis not present

## 2017-04-28 DIAGNOSIS — F3181 Bipolar II disorder: Secondary | ICD-10-CM | POA: Diagnosis not present

## 2017-04-28 DIAGNOSIS — Z681 Body mass index (BMI) 19 or less, adult: Secondary | ICD-10-CM | POA: Diagnosis not present

## 2017-04-28 DIAGNOSIS — K589 Irritable bowel syndrome without diarrhea: Secondary | ICD-10-CM | POA: Diagnosis not present

## 2017-04-28 DIAGNOSIS — J45909 Unspecified asthma, uncomplicated: Secondary | ICD-10-CM | POA: Diagnosis not present

## 2017-04-28 DIAGNOSIS — K219 Gastro-esophageal reflux disease without esophagitis: Secondary | ICD-10-CM | POA: Diagnosis not present

## 2017-04-28 DIAGNOSIS — M8580 Other specified disorders of bone density and structure, unspecified site: Secondary | ICD-10-CM | POA: Diagnosis not present

## 2017-04-28 DIAGNOSIS — Z1389 Encounter for screening for other disorder: Secondary | ICD-10-CM | POA: Diagnosis not present

## 2017-04-28 DIAGNOSIS — E038 Other specified hypothyroidism: Secondary | ICD-10-CM | POA: Diagnosis not present

## 2017-04-28 DIAGNOSIS — R030 Elevated blood-pressure reading, without diagnosis of hypertension: Secondary | ICD-10-CM | POA: Diagnosis not present

## 2017-05-02 DIAGNOSIS — F3181 Bipolar II disorder: Secondary | ICD-10-CM | POA: Diagnosis not present

## 2017-05-05 DIAGNOSIS — F3181 Bipolar II disorder: Secondary | ICD-10-CM | POA: Diagnosis not present

## 2017-05-09 DIAGNOSIS — F3181 Bipolar II disorder: Secondary | ICD-10-CM | POA: Diagnosis not present

## 2017-05-12 DIAGNOSIS — F3181 Bipolar II disorder: Secondary | ICD-10-CM | POA: Diagnosis not present

## 2017-05-19 DIAGNOSIS — F3181 Bipolar II disorder: Secondary | ICD-10-CM | POA: Diagnosis not present

## 2017-05-24 DIAGNOSIS — Z780 Asymptomatic menopausal state: Secondary | ICD-10-CM | POA: Diagnosis not present

## 2017-05-24 DIAGNOSIS — M85851 Other specified disorders of bone density and structure, right thigh: Secondary | ICD-10-CM | POA: Diagnosis not present

## 2017-05-26 DIAGNOSIS — F3181 Bipolar II disorder: Secondary | ICD-10-CM | POA: Diagnosis not present

## 2017-05-30 DIAGNOSIS — F3181 Bipolar II disorder: Secondary | ICD-10-CM | POA: Diagnosis not present

## 2017-05-30 DIAGNOSIS — L570 Actinic keratosis: Secondary | ICD-10-CM | POA: Diagnosis not present

## 2017-06-02 DIAGNOSIS — F3181 Bipolar II disorder: Secondary | ICD-10-CM | POA: Diagnosis not present

## 2017-06-06 DIAGNOSIS — F3181 Bipolar II disorder: Secondary | ICD-10-CM | POA: Diagnosis not present

## 2017-06-07 DIAGNOSIS — Z681 Body mass index (BMI) 19 or less, adult: Secondary | ICD-10-CM | POA: Diagnosis not present

## 2017-06-07 DIAGNOSIS — E038 Other specified hypothyroidism: Secondary | ICD-10-CM | POA: Diagnosis not present

## 2017-06-07 DIAGNOSIS — S60221A Contusion of right hand, initial encounter: Secondary | ICD-10-CM | POA: Diagnosis not present

## 2017-06-07 DIAGNOSIS — R030 Elevated blood-pressure reading, without diagnosis of hypertension: Secondary | ICD-10-CM | POA: Diagnosis not present

## 2017-06-07 DIAGNOSIS — M79641 Pain in right hand: Secondary | ICD-10-CM | POA: Diagnosis not present

## 2017-06-07 DIAGNOSIS — J45909 Unspecified asthma, uncomplicated: Secondary | ICD-10-CM | POA: Diagnosis not present

## 2017-06-09 DIAGNOSIS — F3181 Bipolar II disorder: Secondary | ICD-10-CM | POA: Diagnosis not present

## 2017-06-13 DIAGNOSIS — F3181 Bipolar II disorder: Secondary | ICD-10-CM | POA: Diagnosis not present

## 2017-06-16 DIAGNOSIS — F3181 Bipolar II disorder: Secondary | ICD-10-CM | POA: Diagnosis not present

## 2017-06-20 DIAGNOSIS — F3181 Bipolar II disorder: Secondary | ICD-10-CM | POA: Diagnosis not present

## 2017-06-23 DIAGNOSIS — F3181 Bipolar II disorder: Secondary | ICD-10-CM | POA: Diagnosis not present

## 2017-06-30 DIAGNOSIS — F3181 Bipolar II disorder: Secondary | ICD-10-CM | POA: Diagnosis not present

## 2017-07-04 DIAGNOSIS — S63632A Sprain of interphalangeal joint of right middle finger, initial encounter: Secondary | ICD-10-CM | POA: Diagnosis not present

## 2017-07-06 ENCOUNTER — Encounter: Payer: Self-pay | Admitting: Gynecology

## 2017-07-06 ENCOUNTER — Telehealth: Payer: Self-pay | Admitting: *Deleted

## 2017-07-06 ENCOUNTER — Ambulatory Visit (INDEPENDENT_AMBULATORY_CARE_PROVIDER_SITE_OTHER): Payer: Medicare Other | Admitting: Gynecology

## 2017-07-06 VITALS — BP 130/80

## 2017-07-06 DIAGNOSIS — N950 Postmenopausal bleeding: Secondary | ICD-10-CM

## 2017-07-06 NOTE — Progress Notes (Signed)
    Lyne Khurana 09-29-1961 [id number]        56 y.o.  [id number] presents with a history of light bleeding of the past 2 days. Patient is on HRT to include estradiol 0.5 mg and Provera 2.5 mg daily. Had an episode of bleeding 2015 with a negative sonohysterogram and biopsy. Notes a little bit of cramping this time but otherwise doing well. No urinary or GI symptoms.  Past medical history,surgical history, problem list, medications, allergies, family history and social history were all reviewed and documented in the EPIC chart.  Directed ROS with pertinent positives and negatives documented in the history of present illness/assessment and plan.  Exam: [name] assistant Vitals:   07/06/17 1239  BP: 130/80   General appearance:  Normal Abdomen soft nontender without masses guarding rebound External BUS vagina with atrophic changes. Cervix normal. Uterus normal size midline mobile nontender. Adnexa without masses or tenderness.  Assessment/Plan:  56 y.o. [id number] with episode of postmenopausal bleeding and HRT. Not bleeding now. Recommended baseline sonohysterogram to rule out intracavitary abnormalities such as hyperplasia, polyps or malignancy. Patient and I reviewed the differential and she will scheduled a sonohysterogram and follow up for this.    Anastasio Auerbach MD, 12:44 PM 07/06/2017

## 2017-07-06 NOTE — Telephone Encounter (Signed)
Certainly up to her.

## 2017-07-06 NOTE — Telephone Encounter (Signed)
Pt was seen today for postmenopausal bleeding, has [facility] scheduled on 08/03/17, pt would like to know if she can schedule another provider to be scheduled sooner? Please advise

## 2017-07-06 NOTE — Patient Instructions (Signed)
Follow up for ultrasound as scheduled 

## 2017-07-07 ENCOUNTER — Other Ambulatory Visit: Payer: Self-pay | Admitting: *Deleted

## 2017-07-07 DIAGNOSIS — F3181 Bipolar II disorder: Secondary | ICD-10-CM | POA: Diagnosis not present

## 2017-07-07 DIAGNOSIS — N950 Postmenopausal bleeding: Secondary | ICD-10-CM

## 2017-07-07 NOTE — Telephone Encounter (Signed)
Pt informed, will have front desk call to schedule. Pt aware it may not be any sooner

## 2017-07-11 DIAGNOSIS — F3181 Bipolar II disorder: Secondary | ICD-10-CM | POA: Diagnosis not present

## 2017-07-12 ENCOUNTER — Telehealth: Payer: Self-pay | Admitting: *Deleted

## 2017-07-12 DIAGNOSIS — F5089 Other specified eating disorder: Secondary | ICD-10-CM | POA: Diagnosis not present

## 2017-07-12 DIAGNOSIS — F319 Bipolar disorder, unspecified: Secondary | ICD-10-CM | POA: Diagnosis not present

## 2017-07-12 DIAGNOSIS — R030 Elevated blood-pressure reading, without diagnosis of hypertension: Secondary | ICD-10-CM | POA: Diagnosis not present

## 2017-07-12 DIAGNOSIS — S60221A Contusion of right hand, initial encounter: Secondary | ICD-10-CM | POA: Diagnosis not present

## 2017-07-12 DIAGNOSIS — N939 Abnormal uterine and vaginal bleeding, unspecified: Secondary | ICD-10-CM | POA: Diagnosis not present

## 2017-07-12 DIAGNOSIS — Z681 Body mass index (BMI) 19 or less, adult: Secondary | ICD-10-CM | POA: Diagnosis not present

## 2017-07-12 NOTE — Telephone Encounter (Signed)
Pt called and left message stating she stopped her HRT, scheduled for [facility] on 08/03/17 due to postmenopausal bleeding. Pt said she doesn't require a call back just wanted this documented.

## 2017-07-13 ENCOUNTER — Encounter: Payer: Self-pay | Admitting: Women's Health

## 2017-07-13 ENCOUNTER — Ambulatory Visit (INDEPENDENT_AMBULATORY_CARE_PROVIDER_SITE_OTHER): Payer: Medicare Other | Admitting: Women's Health

## 2017-07-13 VITALS — BP 110/70

## 2017-07-13 DIAGNOSIS — N950 Postmenopausal bleeding: Secondary | ICD-10-CM

## 2017-07-13 NOTE — Progress Notes (Signed)
Presents with questions concerning postmenopausal bleeding. Has been on estradiol 1 mg and Provera 2.5 mg daily started having some bleeding last week with cramping, evaluated by Dr. Phineas Real. Reports stopped HRT after office visit, currently no menopausal symptoms. States is worried about cancer. Has a history of endometriosis. Is currently scheduled for a sonohysterogram August 8 with Dr. Phineas Real. Questions if she should wait that long. Continues to have some spotting today with low abdominal cramping. History of a cervical polyp 2/ 2018.  Exam: Appears well,  Postmenopausal bleeding with follow-up scheduled  Plan: Questions answered, reassurance given regarding sonohysterogram with biopsy follow-up in August. Reviewed possible causes for postmenopausal bleeding, reviewed progesterone  helps to prevent hyperplasia, could possibly have a uterine polyp. Will keep scheduled follow-up. Instructed to call if bleeding becomes heavier or cramping becomes severe.

## 2017-07-14 DIAGNOSIS — F3181 Bipolar II disorder: Secondary | ICD-10-CM | POA: Diagnosis not present

## 2017-07-14 DIAGNOSIS — E559 Vitamin D deficiency, unspecified: Secondary | ICD-10-CM | POA: Diagnosis not present

## 2017-07-14 DIAGNOSIS — E039 Hypothyroidism, unspecified: Secondary | ICD-10-CM | POA: Diagnosis not present

## 2017-07-18 DIAGNOSIS — F3181 Bipolar II disorder: Secondary | ICD-10-CM | POA: Diagnosis not present

## 2017-07-20 ENCOUNTER — Other Ambulatory Visit: Payer: Self-pay | Admitting: Gynecology

## 2017-07-20 DIAGNOSIS — N950 Postmenopausal bleeding: Secondary | ICD-10-CM

## 2017-07-21 DIAGNOSIS — S63632D Sprain of interphalangeal joint of right middle finger, subsequent encounter: Secondary | ICD-10-CM | POA: Diagnosis not present

## 2017-07-21 DIAGNOSIS — F3181 Bipolar II disorder: Secondary | ICD-10-CM | POA: Diagnosis not present

## 2017-07-25 DIAGNOSIS — F3181 Bipolar II disorder: Secondary | ICD-10-CM | POA: Diagnosis not present

## 2017-07-27 DIAGNOSIS — Z029 Encounter for administrative examinations, unspecified: Secondary | ICD-10-CM | POA: Diagnosis not present

## 2017-07-28 DIAGNOSIS — F3181 Bipolar II disorder: Secondary | ICD-10-CM | POA: Diagnosis not present

## 2017-08-03 ENCOUNTER — Ambulatory Visit (INDEPENDENT_AMBULATORY_CARE_PROVIDER_SITE_OTHER): Payer: Medicare Other

## 2017-08-03 ENCOUNTER — Ambulatory Visit (INDEPENDENT_AMBULATORY_CARE_PROVIDER_SITE_OTHER): Payer: Medicare Other | Admitting: Gynecology

## 2017-08-03 VITALS — BP 118/74

## 2017-08-03 DIAGNOSIS — N950 Postmenopausal bleeding: Secondary | ICD-10-CM

## 2017-08-03 NOTE — Progress Notes (Addendum)
    Anne Little Apr 10, 1961 [id number]        56 y.o.  [id number] presents for sonohysterogram. Had episode of light vaginal staining on HRT. Had been on estradiol 0.5 mg and Provera 2.5 mg daily. She subsequently became nervous about this and stopped her HRT and had a withdrawal bleed where she bled heavily for several days. She saw Izora Gala concerned about this who reassured her. She has done no further bleeding. Is not having significant hot flushes or sweats off her HRT.  Past medical history,surgical history, problem list, medications, allergies, family history and social history were all reviewed and documented in the EPIC chart.  Directed ROS with pertinent positives and negatives documented in the history of present illness/assessment and plan.  Exam: [name] assistant BP 118/74 General appearance:  Normal Abdomen soft nontender without masses guarding rebound Pelvic external BUS vagina normal with atrophic changes. Cervix atrophic. Uterus grossly normal size midline mobile nontender. Adnexa without masses or tenderness.  Ultrasound:  Transvaginal and transabdominal shows uterus to be normal size and echotexture. Endometrial echo 1.7 mm. Right ovary with thin-walled echo-free cyst 14 x 8 x 13 mm. Negative color flow Doppler. Left ovary normal. Cul-de-sac negative.  Sonohysterogram performed, sterile technique, easy catheter introduction, good distention with no abnormalities. Endometrial sample taken with scant return.  Assessment/Plan:  56 y.o. [id number] with episode of postmenopausal staining on HRT. Subsequently stopped the HRT and had a withdrawal type bleed which has not resolved. Ultrasound shows no endometrial defects and a thin endometrial echo. Reviewed with patient most likely the initial slight bleeding was due to atrophic changes. Given the very thin endometrial echo unlikely to have significant pathology. Patient will follow up for the biopsy which I discussed with her may be  scant or and adequate which is acceptable given the conical picture of a thin endometrium. At this point that she is having no symptoms off HRT she would prefer to just to monitor and watch for now. She'll call me if significant symptoms develop and she wants to reinitiate HRT. I also discussed the small echo-free avascular cyst on the right ovary which I think is certainly benign and at this point recommend no special follow up and she is comfortable with this. Patient will call if she does any further bleeding. Patient will expect a phone call from [place] with the biopsy results.  Of note: Patient was evaluated 08/03/2017. Due to system wide computer crashing was unable to complete the chart until 08/04/2017. The office note was dictated 08/03/2017 after seeing the patient.    Anastasio Auerbach MD, 6:01 PM 08/03/2017

## 2017-08-04 ENCOUNTER — Encounter: Payer: Self-pay | Admitting: Gynecology

## 2017-08-04 DIAGNOSIS — F3181 Bipolar II disorder: Secondary | ICD-10-CM | POA: Diagnosis not present

## 2017-08-04 DIAGNOSIS — N938 Other specified abnormal uterine and vaginal bleeding: Secondary | ICD-10-CM | POA: Diagnosis not present

## 2017-08-04 NOTE — Patient Instructions (Signed)
Office will call you with biopsy results 

## 2017-08-04 NOTE — Addendum Note (Signed)
Addended by: Nelva Nay on: 08/04/2017 08:54 AM   Modules accepted: Orders

## 2017-08-05 ENCOUNTER — Encounter: Payer: Self-pay | Admitting: Gynecology

## 2017-08-08 DIAGNOSIS — F3181 Bipolar II disorder: Secondary | ICD-10-CM | POA: Diagnosis not present

## 2017-08-08 LAB — PATHOLOGY

## 2017-08-11 DIAGNOSIS — F3181 Bipolar II disorder: Secondary | ICD-10-CM | POA: Diagnosis not present

## 2017-08-15 DIAGNOSIS — F3181 Bipolar II disorder: Secondary | ICD-10-CM | POA: Diagnosis not present

## 2017-08-18 DIAGNOSIS — F3181 Bipolar II disorder: Secondary | ICD-10-CM | POA: Diagnosis not present

## 2017-08-19 DIAGNOSIS — S63632D Sprain of interphalangeal joint of right middle finger, subsequent encounter: Secondary | ICD-10-CM | POA: Diagnosis not present

## 2017-08-22 DIAGNOSIS — F3181 Bipolar II disorder: Secondary | ICD-10-CM | POA: Diagnosis not present

## 2017-08-25 DIAGNOSIS — F3181 Bipolar II disorder: Secondary | ICD-10-CM | POA: Diagnosis not present

## 2017-09-05 DIAGNOSIS — F3181 Bipolar II disorder: Secondary | ICD-10-CM | POA: Diagnosis not present

## 2017-09-08 DIAGNOSIS — F3181 Bipolar II disorder: Secondary | ICD-10-CM | POA: Diagnosis not present

## 2017-09-15 DIAGNOSIS — F3181 Bipolar II disorder: Secondary | ICD-10-CM | POA: Diagnosis not present

## 2017-09-16 DIAGNOSIS — S63632D Sprain of interphalangeal joint of right middle finger, subsequent encounter: Secondary | ICD-10-CM | POA: Diagnosis not present

## 2017-09-19 DIAGNOSIS — F3181 Bipolar II disorder: Secondary | ICD-10-CM | POA: Diagnosis not present

## 2017-09-22 DIAGNOSIS — F3181 Bipolar II disorder: Secondary | ICD-10-CM | POA: Diagnosis not present

## 2017-09-23 DIAGNOSIS — S63632D Sprain of interphalangeal joint of right middle finger, subsequent encounter: Secondary | ICD-10-CM | POA: Diagnosis not present

## 2017-09-26 DIAGNOSIS — F3181 Bipolar II disorder: Secondary | ICD-10-CM | POA: Diagnosis not present

## 2017-09-27 DIAGNOSIS — Z029 Encounter for administrative examinations, unspecified: Secondary | ICD-10-CM | POA: Diagnosis not present

## 2017-09-29 DIAGNOSIS — F3181 Bipolar II disorder: Secondary | ICD-10-CM | POA: Diagnosis not present

## 2017-10-03 DIAGNOSIS — F3181 Bipolar II disorder: Secondary | ICD-10-CM | POA: Diagnosis not present

## 2017-10-04 DIAGNOSIS — S63632D Sprain of interphalangeal joint of right middle finger, subsequent encounter: Secondary | ICD-10-CM | POA: Diagnosis not present

## 2017-10-06 DIAGNOSIS — F3181 Bipolar II disorder: Secondary | ICD-10-CM | POA: Diagnosis not present

## 2017-10-10 DIAGNOSIS — F3181 Bipolar II disorder: Secondary | ICD-10-CM | POA: Diagnosis not present

## 2017-10-13 DIAGNOSIS — F3181 Bipolar II disorder: Secondary | ICD-10-CM | POA: Diagnosis not present

## 2017-10-17 DIAGNOSIS — F3181 Bipolar II disorder: Secondary | ICD-10-CM | POA: Diagnosis not present

## 2017-10-24 DIAGNOSIS — F3181 Bipolar II disorder: Secondary | ICD-10-CM | POA: Diagnosis not present

## 2017-10-27 DIAGNOSIS — F3181 Bipolar II disorder: Secondary | ICD-10-CM | POA: Diagnosis not present

## 2017-10-31 DIAGNOSIS — F3181 Bipolar II disorder: Secondary | ICD-10-CM | POA: Diagnosis not present

## 2017-11-10 DIAGNOSIS — F3181 Bipolar II disorder: Secondary | ICD-10-CM | POA: Diagnosis not present

## 2017-11-14 DIAGNOSIS — F3181 Bipolar II disorder: Secondary | ICD-10-CM | POA: Diagnosis not present

## 2017-11-21 DIAGNOSIS — F3181 Bipolar II disorder: Secondary | ICD-10-CM | POA: Diagnosis not present

## 2017-12-09 DIAGNOSIS — F3181 Bipolar II disorder: Secondary | ICD-10-CM | POA: Diagnosis not present

## 2017-12-12 DIAGNOSIS — F3181 Bipolar II disorder: Secondary | ICD-10-CM | POA: Diagnosis not present

## 2017-12-13 DIAGNOSIS — F3181 Bipolar II disorder: Secondary | ICD-10-CM | POA: Diagnosis not present

## 2017-12-15 DIAGNOSIS — F3181 Bipolar II disorder: Secondary | ICD-10-CM | POA: Diagnosis not present

## 2017-12-22 DIAGNOSIS — F3181 Bipolar II disorder: Secondary | ICD-10-CM | POA: Diagnosis not present

## 2017-12-26 DIAGNOSIS — F3181 Bipolar II disorder: Secondary | ICD-10-CM | POA: Diagnosis not present

## 2017-12-28 DIAGNOSIS — F3181 Bipolar II disorder: Secondary | ICD-10-CM | POA: Diagnosis not present

## 2018-01-02 DIAGNOSIS — F3181 Bipolar II disorder: Secondary | ICD-10-CM | POA: Diagnosis not present

## 2018-01-05 DIAGNOSIS — F3181 Bipolar II disorder: Secondary | ICD-10-CM | POA: Diagnosis not present

## 2018-01-09 DIAGNOSIS — F3181 Bipolar II disorder: Secondary | ICD-10-CM | POA: Diagnosis not present

## 2018-01-11 DIAGNOSIS — H524 Presbyopia: Secondary | ICD-10-CM | POA: Diagnosis not present

## 2018-01-12 DIAGNOSIS — F3181 Bipolar II disorder: Secondary | ICD-10-CM | POA: Diagnosis not present

## 2018-01-16 DIAGNOSIS — F3181 Bipolar II disorder: Secondary | ICD-10-CM | POA: Diagnosis not present

## 2018-01-17 DIAGNOSIS — S63632D Sprain of interphalangeal joint of right middle finger, subsequent encounter: Secondary | ICD-10-CM | POA: Diagnosis not present

## 2018-01-19 DIAGNOSIS — F3181 Bipolar II disorder: Secondary | ICD-10-CM | POA: Diagnosis not present

## 2018-01-23 DIAGNOSIS — F3181 Bipolar II disorder: Secondary | ICD-10-CM | POA: Diagnosis not present

## 2018-01-30 DIAGNOSIS — F3181 Bipolar II disorder: Secondary | ICD-10-CM | POA: Diagnosis not present

## 2018-02-06 DIAGNOSIS — F3181 Bipolar II disorder: Secondary | ICD-10-CM | POA: Diagnosis not present

## 2018-02-20 DIAGNOSIS — F3181 Bipolar II disorder: Secondary | ICD-10-CM | POA: Diagnosis not present

## 2018-02-27 DIAGNOSIS — F3181 Bipolar II disorder: Secondary | ICD-10-CM | POA: Diagnosis not present

## 2018-03-06 DIAGNOSIS — F3181 Bipolar II disorder: Secondary | ICD-10-CM | POA: Diagnosis not present

## 2018-03-20 DIAGNOSIS — F3181 Bipolar II disorder: Secondary | ICD-10-CM | POA: Diagnosis not present

## 2018-03-21 DIAGNOSIS — F3181 Bipolar II disorder: Secondary | ICD-10-CM | POA: Diagnosis not present

## 2018-03-22 ENCOUNTER — Other Ambulatory Visit: Payer: Self-pay | Admitting: Gynecology

## 2018-03-22 DIAGNOSIS — Z139 Encounter for screening, unspecified: Secondary | ICD-10-CM

## 2018-03-23 DIAGNOSIS — Z681 Body mass index (BMI) 19 or less, adult: Secondary | ICD-10-CM | POA: Diagnosis not present

## 2018-03-23 DIAGNOSIS — R030 Elevated blood-pressure reading, without diagnosis of hypertension: Secondary | ICD-10-CM | POA: Diagnosis not present

## 2018-03-23 DIAGNOSIS — J309 Allergic rhinitis, unspecified: Secondary | ICD-10-CM | POA: Diagnosis not present

## 2018-03-23 DIAGNOSIS — J189 Pneumonia, unspecified organism: Secondary | ICD-10-CM | POA: Diagnosis not present

## 2018-03-23 DIAGNOSIS — R05 Cough: Secondary | ICD-10-CM | POA: Diagnosis not present

## 2018-04-03 DIAGNOSIS — F3181 Bipolar II disorder: Secondary | ICD-10-CM | POA: Diagnosis not present

## 2018-04-04 DIAGNOSIS — J189 Pneumonia, unspecified organism: Secondary | ICD-10-CM | POA: Diagnosis not present

## 2018-04-04 DIAGNOSIS — R05 Cough: Secondary | ICD-10-CM | POA: Diagnosis not present

## 2018-04-04 DIAGNOSIS — Z681 Body mass index (BMI) 19 or less, adult: Secondary | ICD-10-CM | POA: Diagnosis not present

## 2018-04-04 DIAGNOSIS — R5383 Other fatigue: Secondary | ICD-10-CM | POA: Diagnosis not present

## 2018-04-04 DIAGNOSIS — R030 Elevated blood-pressure reading, without diagnosis of hypertension: Secondary | ICD-10-CM | POA: Diagnosis not present

## 2018-04-11 ENCOUNTER — Ambulatory Visit: Payer: Self-pay

## 2018-04-17 DIAGNOSIS — J309 Allergic rhinitis, unspecified: Secondary | ICD-10-CM | POA: Diagnosis not present

## 2018-04-17 DIAGNOSIS — R05 Cough: Secondary | ICD-10-CM | POA: Diagnosis not present

## 2018-04-17 DIAGNOSIS — Z681 Body mass index (BMI) 19 or less, adult: Secondary | ICD-10-CM | POA: Diagnosis not present

## 2018-04-17 DIAGNOSIS — J189 Pneumonia, unspecified organism: Secondary | ICD-10-CM | POA: Diagnosis not present

## 2018-04-17 DIAGNOSIS — K219 Gastro-esophageal reflux disease without esophagitis: Secondary | ICD-10-CM | POA: Diagnosis not present

## 2018-04-24 ENCOUNTER — Telehealth: Payer: Self-pay | Admitting: Pulmonary Disease

## 2018-04-24 NOTE — Telephone Encounter (Signed)
Routing this to RA. Patient states that she is a friend of Dr. Elsworth Soho and he is aware that she is dropping this off.

## 2018-04-25 NOTE — Telephone Encounter (Signed)
Please leave on my office desk

## 2018-04-26 DIAGNOSIS — E213 Hyperparathyroidism, unspecified: Secondary | ICD-10-CM | POA: Diagnosis not present

## 2018-04-26 DIAGNOSIS — E559 Vitamin D deficiency, unspecified: Secondary | ICD-10-CM | POA: Diagnosis not present

## 2018-04-26 DIAGNOSIS — E039 Hypothyroidism, unspecified: Secondary | ICD-10-CM | POA: Diagnosis not present

## 2018-05-01 ENCOUNTER — Ambulatory Visit
Admission: RE | Admit: 2018-05-01 | Discharge: 2018-05-01 | Disposition: A | Discharge status: Home / Self Care | Payer: PPO | Source: Ambulatory Visit | Attending: Gynecology | Admitting: Gynecology

## 2018-05-01 DIAGNOSIS — Z139 Encounter for screening, unspecified: Secondary | ICD-10-CM

## 2018-05-01 DIAGNOSIS — Z1231 Encounter for screening mammogram for malignant neoplasm of breast: Secondary | ICD-10-CM | POA: Diagnosis not present

## 2018-05-02 DIAGNOSIS — M899 Disorder of bone, unspecified: Secondary | ICD-10-CM | POA: Diagnosis not present

## 2018-05-02 DIAGNOSIS — E559 Vitamin D deficiency, unspecified: Secondary | ICD-10-CM | POA: Diagnosis not present

## 2018-05-02 DIAGNOSIS — E039 Hypothyroidism, unspecified: Secondary | ICD-10-CM | POA: Diagnosis not present

## 2018-05-02 DIAGNOSIS — E213 Hyperparathyroidism, unspecified: Secondary | ICD-10-CM | POA: Diagnosis not present

## 2018-05-09 DIAGNOSIS — E038 Other specified hypothyroidism: Secondary | ICD-10-CM | POA: Diagnosis not present

## 2018-05-09 DIAGNOSIS — R82998 Other abnormal findings in urine: Secondary | ICD-10-CM | POA: Diagnosis not present

## 2018-05-09 DIAGNOSIS — M859 Disorder of bone density and structure, unspecified: Secondary | ICD-10-CM | POA: Diagnosis not present

## 2018-05-09 DIAGNOSIS — F319 Bipolar disorder, unspecified: Secondary | ICD-10-CM | POA: Diagnosis not present

## 2018-05-18 DIAGNOSIS — R030 Elevated blood-pressure reading, without diagnosis of hypertension: Secondary | ICD-10-CM | POA: Diagnosis not present

## 2018-05-18 DIAGNOSIS — Z0000 Encounter for general adult medical examination without abnormal findings: Secondary | ICD-10-CM | POA: Diagnosis not present

## 2018-05-18 DIAGNOSIS — J45998 Other asthma: Secondary | ICD-10-CM | POA: Diagnosis not present

## 2018-05-18 DIAGNOSIS — Z1389 Encounter for screening for other disorder: Secondary | ICD-10-CM | POA: Diagnosis not present

## 2018-05-18 DIAGNOSIS — N183 Chronic kidney disease, stage 3 (moderate): Secondary | ICD-10-CM | POA: Diagnosis not present

## 2018-05-18 DIAGNOSIS — F3189 Other bipolar disorder: Secondary | ICD-10-CM | POA: Diagnosis not present

## 2018-05-18 DIAGNOSIS — J189 Pneumonia, unspecified organism: Secondary | ICD-10-CM | POA: Diagnosis not present

## 2018-05-18 DIAGNOSIS — M859 Disorder of bone density and structure, unspecified: Secondary | ICD-10-CM | POA: Diagnosis not present

## 2018-05-18 DIAGNOSIS — F5089 Other specified eating disorder: Secondary | ICD-10-CM | POA: Diagnosis not present

## 2018-05-18 DIAGNOSIS — S60221A Contusion of right hand, initial encounter: Secondary | ICD-10-CM | POA: Diagnosis not present

## 2018-05-18 DIAGNOSIS — Z681 Body mass index (BMI) 19 or less, adult: Secondary | ICD-10-CM | POA: Diagnosis not present

## 2018-05-18 DIAGNOSIS — E038 Other specified hypothyroidism: Secondary | ICD-10-CM | POA: Diagnosis not present

## 2018-05-23 DIAGNOSIS — Z1212 Encounter for screening for malignant neoplasm of rectum: Secondary | ICD-10-CM | POA: Diagnosis not present

## 2018-06-01 DIAGNOSIS — Z681 Body mass index (BMI) 19 or less, adult: Secondary | ICD-10-CM | POA: Diagnosis not present

## 2018-06-01 DIAGNOSIS — R220 Localized swelling, mass and lump, head: Secondary | ICD-10-CM | POA: Diagnosis not present

## 2018-06-01 DIAGNOSIS — K1120 Sialoadenitis, unspecified: Secondary | ICD-10-CM | POA: Diagnosis not present

## 2018-06-07 ENCOUNTER — Ambulatory Visit: Payer: PPO | Admitting: Gynecology

## 2018-06-07 ENCOUNTER — Encounter: Payer: Self-pay | Admitting: Gynecology

## 2018-06-07 VITALS — BP 120/74 | Ht 65.5 in | Wt 115.0 lb

## 2018-06-07 DIAGNOSIS — Z01419 Encounter for gynecological examination (general) (routine) without abnormal findings: Secondary | ICD-10-CM | POA: Diagnosis not present

## 2018-06-07 DIAGNOSIS — Z9189 Other specified personal risk factors, not elsewhere classified: Secondary | ICD-10-CM | POA: Diagnosis not present

## 2018-06-07 DIAGNOSIS — M8589 Other specified disorders of bone density and structure, multiple sites: Secondary | ICD-10-CM

## 2018-06-07 NOTE — Progress Notes (Signed)
    Anne Little 03-26-1961 [id number]        57 y.o.  [id number] for breast and pelvic exam.  Without gynecologic complaints.  Past medical history,surgical history, problem list, medications, allergies, family history and social history were all reviewed and documented as reviewed in the EPIC chart.  ROS:  Performed with pertinent positives and negatives included in the history, assessment and plan.   Additional significant findings : None   Exam: [name] assistant Vitals:   06/07/18 1406  BP: 120/74  Weight: 115 lb (52.2 kg)  Height: 5' 5.5" (1.664 m)   Body mass index is 18.85 kg/m.  General appearance:  Normal affect, orientation and appearance. Skin: Grossly normal HEENT: Without gross lesions.  No cervical or supraclavicular adenopathy. Thyroid normal.  Lungs:  Clear without wheezing, rales or rhonchi Cardiac: RR, without RMG Abdominal:  Soft, nontender, without masses, guarding, rebound, organomegaly or hernia Breasts:  Examined lying and sitting without masses, retractions, discharge or axillary adenopathy. Pelvic:  Ext, BUS, Vagina: Normal with mild atrophic changes  Cervix: Normal with mild atrophic changes  Uterus: Anteverted, normal size, shape and contour, midline and mobile nontender   Adnexa: Without masses or tenderness    Anus and perineum: Normal   Rectovaginal: Normal sphincter tone without palpated masses or tenderness.    Assessment/Plan:  57 y.o. [id number] female for breast and pelvic exam.   1. Postmenopausal/atrophic genital changes.  Had been on HRT last year but discontinued after episodes of postmenopausal staining.  Work-up was negative to include negative sonohysterogram and biopsy.  She is done well after discontinuing the HRT without significant hot flushes, night sweats, vaginal dryness or any bleeding. 2. Osteopenia.  DEXA 2015 T score -1.6.  Is followed by Dr. Chalmers Cater.  Is overdue for repeat DEXA and she is going to contact her office and  follow-up with her in reference to this. 3. Mammography 04/2018.  Continue with annual mammography when due.  Breast exam normal today. 4. Pap smear 2018.  No Pap smear done today.  No history of abnormal Pap smears.  Plan repeat Pap smear at 3-year interval per current screening guidelines. 5. Colonoscopy 2018.  Repeat at their recommended interval. 6. Health maintenance.  Patient had questions about kidney function.  Dr. Dagmar Hait had discussed about some abnormalities with her but she does not have a clear understanding of their discussion.  Her BUN and creatinine were normal.  Her GFR is low at 46 and I discussed with her this is possibly what he was talking about.  I encouraged her to call his office and follow-up with them for clarification and she agrees to do so.  No routine blood work done here as this is all done through their office.  Follow-up in 1 year, sooner as needed.   Anastasio Auerbach MD, 2:37 PM 06/07/2018

## 2018-06-07 NOTE — Patient Instructions (Signed)
Follow-up in 1 year for annual exam, sooner as needed. 

## 2018-06-26 DIAGNOSIS — F411 Generalized anxiety disorder: Secondary | ICD-10-CM | POA: Diagnosis not present

## 2018-06-26 DIAGNOSIS — F4312 Post-traumatic stress disorder, chronic: Secondary | ICD-10-CM | POA: Diagnosis not present

## 2018-06-26 DIAGNOSIS — F3132 Bipolar disorder, current episode depressed, moderate: Secondary | ICD-10-CM | POA: Diagnosis not present

## 2018-06-26 DIAGNOSIS — F515 Nightmare disorder: Secondary | ICD-10-CM | POA: Diagnosis not present

## 2018-10-04 DIAGNOSIS — Z79891 Long term (current) use of opiate analgesic: Secondary | ICD-10-CM | POA: Diagnosis not present

## 2018-10-04 DIAGNOSIS — F411 Generalized anxiety disorder: Secondary | ICD-10-CM | POA: Diagnosis not present

## 2018-10-04 DIAGNOSIS — F4312 Post-traumatic stress disorder, chronic: Secondary | ICD-10-CM | POA: Diagnosis not present

## 2018-10-04 DIAGNOSIS — F3181 Bipolar II disorder: Secondary | ICD-10-CM | POA: Diagnosis not present

## 2018-10-16 DIAGNOSIS — M79644 Pain in right finger(s): Secondary | ICD-10-CM | POA: Diagnosis not present

## 2018-11-07 DIAGNOSIS — D229 Melanocytic nevi, unspecified: Secondary | ICD-10-CM | POA: Diagnosis not present

## 2018-11-07 DIAGNOSIS — L821 Other seborrheic keratosis: Secondary | ICD-10-CM | POA: Diagnosis not present

## 2018-11-07 DIAGNOSIS — L570 Actinic keratosis: Secondary | ICD-10-CM | POA: Diagnosis not present

## 2018-11-15 DIAGNOSIS — F3181 Bipolar II disorder: Secondary | ICD-10-CM | POA: Diagnosis not present

## 2018-11-15 DIAGNOSIS — F4312 Post-traumatic stress disorder, chronic: Secondary | ICD-10-CM | POA: Diagnosis not present

## 2018-11-15 DIAGNOSIS — F411 Generalized anxiety disorder: Secondary | ICD-10-CM | POA: Diagnosis not present

## 2018-11-20 DIAGNOSIS — J45909 Unspecified asthma, uncomplicated: Secondary | ICD-10-CM | POA: Diagnosis not present

## 2018-11-20 DIAGNOSIS — K219 Gastro-esophageal reflux disease without esophagitis: Secondary | ICD-10-CM | POA: Diagnosis not present

## 2018-11-20 DIAGNOSIS — R030 Elevated blood-pressure reading, without diagnosis of hypertension: Secondary | ICD-10-CM | POA: Diagnosis not present

## 2018-11-20 DIAGNOSIS — Z681 Body mass index (BMI) 19 or less, adult: Secondary | ICD-10-CM | POA: Diagnosis not present

## 2018-11-20 DIAGNOSIS — N183 Chronic kidney disease, stage 3 (moderate): Secondary | ICD-10-CM | POA: Diagnosis not present

## 2018-11-20 DIAGNOSIS — F509 Eating disorder, unspecified: Secondary | ICD-10-CM | POA: Diagnosis not present

## 2018-11-20 DIAGNOSIS — E038 Other specified hypothyroidism: Secondary | ICD-10-CM | POA: Diagnosis not present

## 2018-11-20 DIAGNOSIS — F319 Bipolar disorder, unspecified: Secondary | ICD-10-CM | POA: Diagnosis not present

## 2018-11-20 DIAGNOSIS — Z23 Encounter for immunization: Secondary | ICD-10-CM | POA: Diagnosis not present

## 2018-12-11 DIAGNOSIS — N183 Chronic kidney disease, stage 3 (moderate): Secondary | ICD-10-CM | POA: Diagnosis not present

## 2018-12-14 ENCOUNTER — Other Ambulatory Visit: Payer: Self-pay | Admitting: Internal Medicine

## 2018-12-14 DIAGNOSIS — N183 Chronic kidney disease, stage 3 unspecified: Secondary | ICD-10-CM

## 2018-12-21 ENCOUNTER — Ambulatory Visit
Admission: RE | Admit: 2018-12-21 | Discharge: 2018-12-21 | Disposition: A | Discharge status: Home / Self Care | Payer: PPO | Source: Ambulatory Visit | Attending: Internal Medicine | Admitting: Internal Medicine

## 2018-12-21 DIAGNOSIS — N183 Chronic kidney disease, stage 3 unspecified: Secondary | ICD-10-CM

## 2018-12-21 DIAGNOSIS — N189 Chronic kidney disease, unspecified: Secondary | ICD-10-CM | POA: Diagnosis not present

## 2019-02-05 DIAGNOSIS — F411 Generalized anxiety disorder: Secondary | ICD-10-CM | POA: Diagnosis not present

## 2019-02-05 DIAGNOSIS — F4312 Post-traumatic stress disorder, chronic: Secondary | ICD-10-CM | POA: Diagnosis not present

## 2019-02-05 DIAGNOSIS — F3181 Bipolar II disorder: Secondary | ICD-10-CM | POA: Diagnosis not present

## 2019-02-07 DIAGNOSIS — M8580 Other specified disorders of bone density and structure, unspecified site: Secondary | ICD-10-CM | POA: Diagnosis not present

## 2019-02-07 DIAGNOSIS — E038 Other specified hypothyroidism: Secondary | ICD-10-CM | POA: Diagnosis not present

## 2019-02-07 DIAGNOSIS — M859 Disorder of bone density and structure, unspecified: Secondary | ICD-10-CM | POA: Diagnosis not present

## 2019-02-07 DIAGNOSIS — F319 Bipolar disorder, unspecified: Secondary | ICD-10-CM | POA: Diagnosis not present

## 2019-03-28 DIAGNOSIS — M1711 Unilateral primary osteoarthritis, right knee: Secondary | ICD-10-CM | POA: Diagnosis not present

## 2019-03-28 DIAGNOSIS — Z20828 Contact with and (suspected) exposure to other viral communicable diseases: Secondary | ICD-10-CM | POA: Diagnosis not present

## 2019-03-28 DIAGNOSIS — N135 Crossing vessel and stricture of ureter without hydronephrosis: Secondary | ICD-10-CM | POA: Diagnosis not present

## 2019-03-28 DIAGNOSIS — Z23 Encounter for immunization: Secondary | ICD-10-CM | POA: Diagnosis not present

## 2019-03-28 DIAGNOSIS — I10 Essential (primary) hypertension: Secondary | ICD-10-CM | POA: Diagnosis not present

## 2019-03-28 DIAGNOSIS — N201 Calculus of ureter: Secondary | ICD-10-CM | POA: Diagnosis not present

## 2019-03-28 DIAGNOSIS — I129 Hypertensive chronic kidney disease with stage 1 through stage 4 chronic kidney disease, or unspecified chronic kidney disease: Secondary | ICD-10-CM | POA: Diagnosis not present

## 2019-03-28 DIAGNOSIS — C20 Malignant neoplasm of rectum: Secondary | ICD-10-CM | POA: Diagnosis not present

## 2019-03-28 DIAGNOSIS — N183 Chronic kidney disease, stage 3 (moderate): Secondary | ICD-10-CM | POA: Diagnosis not present

## 2019-03-28 DIAGNOSIS — N189 Chronic kidney disease, unspecified: Secondary | ICD-10-CM | POA: Diagnosis not present

## 2019-03-28 DIAGNOSIS — F411 Generalized anxiety disorder: Secondary | ICD-10-CM | POA: Diagnosis not present

## 2019-03-28 DIAGNOSIS — F319 Bipolar disorder, unspecified: Secondary | ICD-10-CM | POA: Diagnosis not present

## 2019-03-28 DIAGNOSIS — N1330 Unspecified hydronephrosis: Secondary | ICD-10-CM | POA: Diagnosis not present

## 2019-03-28 DIAGNOSIS — M961 Postlaminectomy syndrome, not elsewhere classified: Secondary | ICD-10-CM | POA: Diagnosis not present

## 2019-03-28 DIAGNOSIS — D631 Anemia in chronic kidney disease: Secondary | ICD-10-CM | POA: Diagnosis not present

## 2019-04-02 DIAGNOSIS — F3181 Bipolar II disorder: Secondary | ICD-10-CM | POA: Diagnosis not present

## 2019-04-02 DIAGNOSIS — F411 Generalized anxiety disorder: Secondary | ICD-10-CM | POA: Diagnosis not present

## 2019-04-02 DIAGNOSIS — F4312 Post-traumatic stress disorder, chronic: Secondary | ICD-10-CM | POA: Diagnosis not present

## 2019-04-27 DIAGNOSIS — E559 Vitamin D deficiency, unspecified: Secondary | ICD-10-CM | POA: Diagnosis not present

## 2019-04-27 DIAGNOSIS — E039 Hypothyroidism, unspecified: Secondary | ICD-10-CM | POA: Diagnosis not present

## 2019-04-27 DIAGNOSIS — E213 Hyperparathyroidism, unspecified: Secondary | ICD-10-CM | POA: Diagnosis not present

## 2019-05-03 DIAGNOSIS — F331 Major depressive disorder, recurrent, moderate: Secondary | ICD-10-CM | POA: Diagnosis not present

## 2019-05-03 DIAGNOSIS — F3181 Bipolar II disorder: Secondary | ICD-10-CM | POA: Diagnosis not present

## 2019-05-03 DIAGNOSIS — F411 Generalized anxiety disorder: Secondary | ICD-10-CM | POA: Diagnosis not present

## 2019-05-03 DIAGNOSIS — F4312 Post-traumatic stress disorder, chronic: Secondary | ICD-10-CM | POA: Diagnosis not present

## 2019-05-04 DIAGNOSIS — E213 Hyperparathyroidism, unspecified: Secondary | ICD-10-CM | POA: Diagnosis not present

## 2019-05-04 DIAGNOSIS — E559 Vitamin D deficiency, unspecified: Secondary | ICD-10-CM | POA: Diagnosis not present

## 2019-05-04 DIAGNOSIS — E039 Hypothyroidism, unspecified: Secondary | ICD-10-CM | POA: Diagnosis not present

## 2019-05-04 DIAGNOSIS — M899 Disorder of bone, unspecified: Secondary | ICD-10-CM | POA: Diagnosis not present

## 2019-05-15 DIAGNOSIS — F411 Generalized anxiety disorder: Secondary | ICD-10-CM | POA: Diagnosis not present

## 2019-05-15 DIAGNOSIS — F3181 Bipolar II disorder: Secondary | ICD-10-CM | POA: Diagnosis not present

## 2019-05-15 DIAGNOSIS — F4312 Post-traumatic stress disorder, chronic: Secondary | ICD-10-CM | POA: Diagnosis not present

## 2019-05-18 DIAGNOSIS — R7989 Other specified abnormal findings of blood chemistry: Secondary | ICD-10-CM | POA: Diagnosis not present

## 2019-05-18 DIAGNOSIS — M859 Disorder of bone density and structure, unspecified: Secondary | ICD-10-CM | POA: Diagnosis not present

## 2019-05-18 DIAGNOSIS — E038 Other specified hypothyroidism: Secondary | ICD-10-CM | POA: Diagnosis not present

## 2019-05-18 DIAGNOSIS — R82998 Other abnormal findings in urine: Secondary | ICD-10-CM | POA: Diagnosis not present

## 2019-05-24 DIAGNOSIS — R030 Elevated blood-pressure reading, without diagnosis of hypertension: Secondary | ICD-10-CM | POA: Diagnosis not present

## 2019-05-24 DIAGNOSIS — F509 Eating disorder, unspecified: Secondary | ICD-10-CM | POA: Diagnosis not present

## 2019-05-24 DIAGNOSIS — Z1339 Encounter for screening examination for other mental health and behavioral disorders: Secondary | ICD-10-CM | POA: Diagnosis not present

## 2019-05-24 DIAGNOSIS — J309 Allergic rhinitis, unspecified: Secondary | ICD-10-CM | POA: Diagnosis not present

## 2019-05-24 DIAGNOSIS — J45909 Unspecified asthma, uncomplicated: Secondary | ICD-10-CM | POA: Diagnosis not present

## 2019-05-24 DIAGNOSIS — N183 Chronic kidney disease, stage 3 (moderate): Secondary | ICD-10-CM | POA: Diagnosis not present

## 2019-05-24 DIAGNOSIS — E211 Secondary hyperparathyroidism, not elsewhere classified: Secondary | ICD-10-CM | POA: Diagnosis not present

## 2019-05-24 DIAGNOSIS — K219 Gastro-esophageal reflux disease without esophagitis: Secondary | ICD-10-CM | POA: Diagnosis not present

## 2019-05-24 DIAGNOSIS — F319 Bipolar disorder, unspecified: Secondary | ICD-10-CM | POA: Diagnosis not present

## 2019-05-24 DIAGNOSIS — D649 Anemia, unspecified: Secondary | ICD-10-CM | POA: Diagnosis not present

## 2019-05-24 DIAGNOSIS — Z1331 Encounter for screening for depression: Secondary | ICD-10-CM | POA: Diagnosis not present

## 2019-05-24 DIAGNOSIS — M8580 Other specified disorders of bone density and structure, unspecified site: Secondary | ICD-10-CM | POA: Diagnosis not present

## 2019-05-24 DIAGNOSIS — Z0000 Encounter for general adult medical examination without abnormal findings: Secondary | ICD-10-CM | POA: Diagnosis not present

## 2019-05-24 DIAGNOSIS — E039 Hypothyroidism, unspecified: Secondary | ICD-10-CM | POA: Diagnosis not present

## 2019-05-28 DIAGNOSIS — M8580 Other specified disorders of bone density and structure, unspecified site: Secondary | ICD-10-CM

## 2019-05-28 HISTORY — DX: Other specified disorders of bone density and structure, unspecified site: M85.80

## 2019-06-06 DIAGNOSIS — F4310 Post-traumatic stress disorder, unspecified: Secondary | ICD-10-CM | POA: Diagnosis not present

## 2019-06-06 DIAGNOSIS — F3181 Bipolar II disorder: Secondary | ICD-10-CM | POA: Diagnosis not present

## 2019-06-06 DIAGNOSIS — F509 Eating disorder, unspecified: Secondary | ICD-10-CM | POA: Diagnosis not present

## 2019-06-18 DIAGNOSIS — F909 Attention-deficit hyperactivity disorder, unspecified type: Secondary | ICD-10-CM | POA: Diagnosis not present

## 2019-06-18 DIAGNOSIS — F509 Eating disorder, unspecified: Secondary | ICD-10-CM | POA: Diagnosis not present

## 2019-06-18 DIAGNOSIS — F3181 Bipolar II disorder: Secondary | ICD-10-CM | POA: Diagnosis not present

## 2019-06-18 DIAGNOSIS — F4310 Post-traumatic stress disorder, unspecified: Secondary | ICD-10-CM | POA: Diagnosis not present

## 2019-06-26 DIAGNOSIS — Z8262 Family history of osteoporosis: Secondary | ICD-10-CM | POA: Diagnosis not present

## 2019-06-26 DIAGNOSIS — M8589 Other specified disorders of bone density and structure, multiple sites: Secondary | ICD-10-CM | POA: Diagnosis not present

## 2019-06-27 ENCOUNTER — Encounter: Payer: Self-pay | Admitting: Gynecology

## 2019-06-28 ENCOUNTER — Encounter: Payer: Self-pay | Admitting: Gynecology

## 2019-06-28 NOTE — Telephone Encounter (Signed)
The absence of other indications for HRT they do not recommend using HRT for the sole purpose of bone protection.

## 2019-07-02 DIAGNOSIS — R05 Cough: Secondary | ICD-10-CM | POA: Diagnosis not present

## 2019-07-02 DIAGNOSIS — Z20818 Contact with and (suspected) exposure to other bacterial communicable diseases: Secondary | ICD-10-CM | POA: Diagnosis not present

## 2019-07-02 DIAGNOSIS — I130 Hypertensive heart and chronic kidney disease with heart failure and stage 1 through stage 4 chronic kidney disease, or unspecified chronic kidney disease: Secondary | ICD-10-CM | POA: Diagnosis not present

## 2019-07-02 DIAGNOSIS — N183 Chronic kidney disease, stage 3 (moderate): Secondary | ICD-10-CM | POA: Diagnosis not present

## 2019-07-02 DIAGNOSIS — J181 Lobar pneumonia, unspecified organism: Secondary | ICD-10-CM | POA: Diagnosis not present

## 2019-07-05 ENCOUNTER — Ambulatory Visit (INDEPENDENT_AMBULATORY_CARE_PROVIDER_SITE_OTHER)
Admission: RE | Admit: 2019-07-05 | Discharge: 2019-07-05 | Disposition: A | Discharge status: Home / Self Care | Payer: PPO | Source: Ambulatory Visit

## 2019-07-05 DIAGNOSIS — Z20828 Contact with and (suspected) exposure to other viral communicable diseases: Secondary | ICD-10-CM | POA: Diagnosis not present

## 2019-07-05 DIAGNOSIS — J181 Lobar pneumonia, unspecified organism: Secondary | ICD-10-CM

## 2019-07-05 DIAGNOSIS — Z20822 Contact with and (suspected) exposure to covid-19: Secondary | ICD-10-CM

## 2019-07-05 DIAGNOSIS — J189 Pneumonia, unspecified organism: Secondary | ICD-10-CM

## 2019-07-05 NOTE — ED Provider Notes (Signed)
[place]    Virtual Visit via Video Note:  Anne Little  initiated request for Telemedicine visit with [facility] team. I connected with Anne Little  on 07/05/2019 at 11:06 AM  for a synchronized telemedicine visit using a video enabled HIPPA compliant telemedicine application. I verified that I am speaking with Anne Little  using two identifiers. Lestine Box, PA-C  was physically located in a [facility]gent care site and Anne Little was located at a different location.   The limitations of evaluation and management by telemedicine as well as the availability of in-person appointments were discussed. Patient was informed that she  may incur a bill ( including co-pay) for this virtual visit encounter. Anne Little  expressed understanding and gave verbal consent to proceed with virtual visit.  [id number] 07/05/19 Arrival Time: 1031  Cc: PNA follow up  SUBJECTIVE:  Anne Little is a 58 y.o. female hx significant for anxiety, asthma, bipolar 1 disorder, chronic post-traumatic stress disorder, colon polyps, depression, endometriosis, GERD, hemorrhoids, hepatitis, HLD, hypothyroid, IBS, kidney disease, OP, parathyroid disease, and RLS, who presents for follow up for pneumonia.  Reports fever, cough and fatigue 6 days ago.  Was seen at [facility], and was diagnosed with LLL pneumonia on chest x-ray.  Was started on levaquin, and has been taking medication as directed.  Is on day 4 of antibiotic.  Reports improvement in symptoms including fever, cough.  However, still mentions feeling fatigue, and decreased appetite.  Also had COVID testing at that time, has not received results yet.  Denies known exposure to COVID.  Denies chills, sinus pain, rhinorrhea, sore throat, SOB, wheezing, chest pain, nausea, vomiting.    ROS: As per HPI.  Past Medical History:  Diagnosis Date  . Allergy   . Anemia   . Anxiety   . Asthma    with seasonal allergies at  times- mild  . Bipolar 1 disorder ([place])   . Chronic post-traumatic stress disorder (PTSD) 02/11/2015  . Colon polyps   . Constipation   . Depression   . Endometriosis   . GERD (gastroesophageal reflux disease)   . Hemorrhoids    internal  . Hepatitis   . Hx of adenomatous colonic polyps   . Hyperlipidemia   . Hypothyroidism   . IBS (irritable bowel syndrome)   . Kidney disease   . Kidney stones   . Osteopenia 05/2019   T score -1.6 FRAX 6% / 0.6% stable from prior DEXA  . Parathyroid disease ([place])   . PTSD (post-traumatic stress disorder)   . RLS (restless legs syndrome) 02/11/2015  . Thyroid disease    Past Surgical History:  Procedure Laterality Date  . APPENDECTOMY    . COLONOSCOPY  2013   [facility]  . ECTOPIC PREGNANCY SURGERY    . OTHER SURGICAL HISTORY  1980   endometriosis/ectopic pregnancy  . POLYPECTOMY     Allergies  Allergen Reactions  . Benzodiazepines Other (See Comments)    paradoxical effect   . Erythromycin Other (See Comments)    vomiting  . Macrolides And Ketolides Other (See Comments)  . Neomycin Swelling    "topical mycins"  . Penicillins Hives    Pt unsure if allergy, due to having seen an horrific sight and hives could be anxiety related.    . Trazodone And Nefazodone     Per pt she experiences a "paradoxical" reaction and shakiness with Trazodone in the past.   . Lamictal [Lamotrigine] Rash  Current Facility-Administered Medications on File Prior to Encounter  Medication Dose Route Frequency Provider Last Rate Last Dose  . 0.9 %  sodium chloride infusion  500 mL Intravenous Continuous Armbruster, Carlota Raspberry, MD       Current Outpatient Medications on File Prior to Encounter  Medication Sig Dispense Refill  . buPROPion (WELLBUTRIN XL) 300 MG 24 hr tablet Take 300 mg by mouth daily.   0  . Docusate Calcium (STOOL SOFTENER PO) Take by mouth at bedtime.    . Esomeprazole Magnesium (NEXIUM 24HR) 20 MG TBEC Take 1 tablet by mouth daily.    .  fluticasone (FLONASE) 50 MCG/ACT nasal spray USE 2 SPRAYS EACH NOSTRIL 1XD  3  . gabapentin (NEURONTIN) 300 MG capsule Take 3 capsules (900 mg total) by mouth at bedtime. agitation 90 capsule 0  . levothyroxine (SYNTHROID) 50 MCG tablet Take 50 mcg by mouth. Take 5 days a week    . lithium carbonate 300 MG capsule Take 300 mg by mouth daily. Take 1 cap in the evening    . propranolol (INDERAL) 10 MG tablet Take 10 mg by mouth 3 (three) times daily.    . [DISCONTINUED] dicyclomine (BENTYL) 10 MG capsule TAKE ONE CAPSULE BY MOUTH EVERY 8 HOURS AS NEEDED FOR SPASMS (Patient not taking: Reported on 02/04/2017) 30 capsule 1    Social History   Socioeconomic History  . Marital status: Divorced    Spouse name: Not on file  . Number of children: 2  . Years of education: college  . Highest education level: Not on file  Occupational History  . Not on file  Social Needs  . Financial resource strain: Not on file  . Food insecurity    Worry: Not on file    Inability: Not on file  . Transportation needs    Medical: Not on file    Non-medical: Not on file  Tobacco Use  . Smoking status: Former Smoker    Quit date: 12/27/1986    Years since quitting: 32.5  . Smokeless tobacco: Never Used  . Tobacco comment: quit smoking 26 years ago  Substance and Sexual Activity  . Alcohol use: Yes    Alcohol/week: 0.0 standard drinks    Comment: Rare  . Drug use: No    Comment: quit 1980  . Sexual activity: Yes    Comment: 1st intercourse 58 yo-More than 5 partners  Lifestyle  . Physical activity    Days per week: Not on file    Minutes per session: Not on file  . Stress: Not on file  Relationships  . Social Herbalist on phone: Not on file    Gets together: Not on file    Attends religious service: Not on file    Active member of club or organization: Not on file    Attends meetings of clubs or organizations: Not on file    Relationship status: Not on file  . Intimate partner violence     Fear of current or ex partner: Not on file    Emotionally abused: Not on file    Physically abused: Not on file    Forced sexual activity: Not on file  Other Topics Concern  . Not on file  Social History Narrative   Caffeine 1 cup daily.   Family History  Problem Relation Age of Onset  . Hyperlipidemia Mother   . Bladder Cancer Father   . Hyperlipidemia Father   . Hypertension Father   .  Hyperlipidemia Sister   . Hyperlipidemia Maternal Grandmother   . Hypertension Maternal Grandmother   . Stroke Maternal Grandmother   . Mental illness Maternal Grandfather   . Alcoholism Maternal Grandfather   . Heart disease Paternal Grandmother   . Hyperlipidemia Paternal Grandmother   . Hypertension Paternal Grandmother   . Stroke Paternal Grandmother   . Cancer Paternal Grandfather        Colon? vs stomach ?  . Colon cancer Maternal Aunt   . Ovarian cancer Maternal Aunt        Lynch syndrome  . Colon cancer Other   . Colon cancer Cousin   . Breast cancer Cousin   . Prostate cancer Cousin   . Colon polyps Neg Hx   . Rectal cancer Neg Hx   . Stomach cancer Neg Hx      OBJECTIVE:  There were no vitals filed for this visit.  Checked pulse ox this morning and was 95% at home  General appearance: alert; no distress Eyes: EOMI grossly HENT: normocephalic; atraumatic Neck: supple with FROM Lungs: normal respiratory effort; speaking in full sentences without difficulty, mild intermittent cough Extremities: moves extremities without difficulty Skin: No obvious rashes Neurologic: No facial asymmetries Psychological: alert and cooperative; normal mood and affect   ASSESSMENT & PLAN:  1. Pneumonia of left lower lobe due to infectious organism ([place])   2. Suspected Covid-19 Virus Infection     No orders of the defined types were placed in this encounter.   No orders of the defined types were placed in this encounter.   Declines zofran at this time.   Get plenty of rest and  push fluids Continue to supplement with ensure as needed until your appetite comes back Continue antibiotic as prescribed and to completion Alternate ibuprofen and tylenol as needed for fever, and body aches Please remain quarantined in your home until you have received your COVID 19 results.   Follow up with PCP via video visit or phone call next week for recheck and to ensure your symptoms are improving Return (to be evaluated in person) or go to ER if you have any new or worsening symptoms such as worsening fever, chills, worsening fatigue, chest pain, shortness of breath, pulse ox <90%, extremities or lips turning blue, etc...  It is always a good idea to have a repeat chest x-ray in 6 weeks to ensure pneumonia has resolved and there were no underlying abnormalities  I discussed the assessment and treatment plan with the patient. The patient was provided an opportunity to ask questions and all were answered. The patient agreed with the plan and demonstrated an understanding of the instructions.   The patient was advised to call back or seek an in-person evaluation if the symptoms worsen or if the condition fails to improve as anticipated.  I provided 15 minutes of non-face-to-face time during this encounter.  Lestine Box, PA-C  07/05/2019 11:06 AM           Lestine Box, PA-C 07/05/19 1107

## 2019-07-05 NOTE — Discharge Instructions (Addendum)
Get plenty of rest and push fluids Continue to supplement with ensure as needed until your appetite comes back Continue antibiotic as prescribed and to completion Alternate ibuprofen and tylenol as needed for fever, and body aches Please remain quarantined in your home until you have received your COVID 19 results.   Follow up with PCP via video visit or phone call next week for recheck and to ensure your symptoms are improving Return or go to ER if you have any new or worsening symptoms such as worsening fever, chills, worsening fatigue, chest pain, shortness of breath, pulse ox <90%, extremities or lips turning blue, etc...  It is always a good idea to have a repeat chest x-ray in 6 weeks to ensure pneumonia has resolved and there were no underlying abnormalities

## 2019-07-12 DIAGNOSIS — F509 Eating disorder, unspecified: Secondary | ICD-10-CM | POA: Diagnosis not present

## 2019-07-12 DIAGNOSIS — F909 Attention-deficit hyperactivity disorder, unspecified type: Secondary | ICD-10-CM | POA: Diagnosis not present

## 2019-07-12 DIAGNOSIS — F3181 Bipolar II disorder: Secondary | ICD-10-CM | POA: Diagnosis not present

## 2019-07-12 DIAGNOSIS — F4310 Post-traumatic stress disorder, unspecified: Secondary | ICD-10-CM | POA: Diagnosis not present

## 2019-07-26 DIAGNOSIS — F509 Eating disorder, unspecified: Secondary | ICD-10-CM | POA: Diagnosis not present

## 2019-07-26 DIAGNOSIS — E039 Hypothyroidism, unspecified: Secondary | ICD-10-CM | POA: Diagnosis not present

## 2019-07-26 DIAGNOSIS — F4310 Post-traumatic stress disorder, unspecified: Secondary | ICD-10-CM | POA: Diagnosis not present

## 2019-07-26 DIAGNOSIS — F3181 Bipolar II disorder: Secondary | ICD-10-CM | POA: Diagnosis not present

## 2019-07-26 DIAGNOSIS — F909 Attention-deficit hyperactivity disorder, unspecified type: Secondary | ICD-10-CM | POA: Diagnosis not present

## 2019-07-31 DIAGNOSIS — J181 Lobar pneumonia, unspecified organism: Secondary | ICD-10-CM | POA: Diagnosis not present

## 2019-07-31 DIAGNOSIS — J309 Allergic rhinitis, unspecified: Secondary | ICD-10-CM | POA: Diagnosis not present

## 2019-07-31 DIAGNOSIS — K219 Gastro-esophageal reflux disease without esophagitis: Secondary | ICD-10-CM | POA: Diagnosis not present

## 2019-07-31 DIAGNOSIS — K589 Irritable bowel syndrome without diarrhea: Secondary | ICD-10-CM | POA: Diagnosis not present

## 2019-08-13 DIAGNOSIS — F339 Major depressive disorder, recurrent, unspecified: Secondary | ICD-10-CM | POA: Diagnosis not present

## 2019-08-20 DIAGNOSIS — F339 Major depressive disorder, recurrent, unspecified: Secondary | ICD-10-CM | POA: Diagnosis not present

## 2019-08-23 DIAGNOSIS — F4310 Post-traumatic stress disorder, unspecified: Secondary | ICD-10-CM | POA: Diagnosis not present

## 2019-08-23 DIAGNOSIS — F909 Attention-deficit hyperactivity disorder, unspecified type: Secondary | ICD-10-CM | POA: Diagnosis not present

## 2019-08-23 DIAGNOSIS — F509 Eating disorder, unspecified: Secondary | ICD-10-CM | POA: Diagnosis not present

## 2019-08-23 DIAGNOSIS — F3181 Bipolar II disorder: Secondary | ICD-10-CM | POA: Diagnosis not present

## 2019-08-27 DIAGNOSIS — F339 Major depressive disorder, recurrent, unspecified: Secondary | ICD-10-CM | POA: Diagnosis not present

## 2019-09-03 DIAGNOSIS — F339 Major depressive disorder, recurrent, unspecified: Secondary | ICD-10-CM | POA: Diagnosis not present

## 2019-09-10 DIAGNOSIS — F339 Major depressive disorder, recurrent, unspecified: Secondary | ICD-10-CM | POA: Diagnosis not present

## 2019-09-13 DIAGNOSIS — F339 Major depressive disorder, recurrent, unspecified: Secondary | ICD-10-CM | POA: Diagnosis not present

## 2019-09-17 DIAGNOSIS — F339 Major depressive disorder, recurrent, unspecified: Secondary | ICD-10-CM | POA: Diagnosis not present

## 2019-09-24 DIAGNOSIS — F339 Major depressive disorder, recurrent, unspecified: Secondary | ICD-10-CM | POA: Diagnosis not present

## 2019-09-25 ENCOUNTER — Encounter: Payer: Self-pay | Admitting: Gynecology

## 2019-10-01 DIAGNOSIS — F339 Major depressive disorder, recurrent, unspecified: Secondary | ICD-10-CM | POA: Diagnosis not present

## 2019-10-02 DIAGNOSIS — E039 Hypothyroidism, unspecified: Secondary | ICD-10-CM | POA: Diagnosis not present

## 2019-10-03 DIAGNOSIS — D631 Anemia in chronic kidney disease: Secondary | ICD-10-CM | POA: Diagnosis not present

## 2019-10-03 DIAGNOSIS — N183 Chronic kidney disease, stage 3 unspecified: Secondary | ICD-10-CM | POA: Diagnosis not present

## 2019-10-03 DIAGNOSIS — I129 Hypertensive chronic kidney disease with stage 1 through stage 4 chronic kidney disease, or unspecified chronic kidney disease: Secondary | ICD-10-CM | POA: Diagnosis not present

## 2019-10-03 DIAGNOSIS — E46 Unspecified protein-calorie malnutrition: Secondary | ICD-10-CM | POA: Diagnosis not present

## 2019-10-03 DIAGNOSIS — N2581 Secondary hyperparathyroidism of renal origin: Secondary | ICD-10-CM | POA: Diagnosis not present

## 2019-10-03 DIAGNOSIS — Z23 Encounter for immunization: Secondary | ICD-10-CM | POA: Diagnosis not present

## 2019-10-08 DIAGNOSIS — F339 Major depressive disorder, recurrent, unspecified: Secondary | ICD-10-CM | POA: Diagnosis not present

## 2019-10-11 DIAGNOSIS — F4310 Post-traumatic stress disorder, unspecified: Secondary | ICD-10-CM | POA: Diagnosis not present

## 2019-10-11 DIAGNOSIS — F3181 Bipolar II disorder: Secondary | ICD-10-CM | POA: Diagnosis not present

## 2019-10-11 DIAGNOSIS — F909 Attention-deficit hyperactivity disorder, unspecified type: Secondary | ICD-10-CM | POA: Diagnosis not present

## 2019-10-11 DIAGNOSIS — F509 Eating disorder, unspecified: Secondary | ICD-10-CM | POA: Diagnosis not present

## 2019-10-15 DIAGNOSIS — F339 Major depressive disorder, recurrent, unspecified: Secondary | ICD-10-CM | POA: Diagnosis not present

## 2019-10-29 DIAGNOSIS — F339 Major depressive disorder, recurrent, unspecified: Secondary | ICD-10-CM | POA: Diagnosis not present

## 2019-11-01 ENCOUNTER — Encounter: Payer: Self-pay | Admitting: Dietician

## 2019-11-01 ENCOUNTER — Encounter: Payer: PPO | Attending: Nephrology | Admitting: Dietician

## 2019-11-01 ENCOUNTER — Other Ambulatory Visit: Payer: Self-pay

## 2019-11-01 DIAGNOSIS — F5089 Other specified eating disorder: Secondary | ICD-10-CM | POA: Insufficient documentation

## 2019-11-01 DIAGNOSIS — N1832 Chronic kidney disease, stage 3b: Secondary | ICD-10-CM | POA: Insufficient documentation

## 2019-11-01 NOTE — Patient Instructions (Addendum)
1.  Avoid skipping meals - put an alarm on your phone at 12:30 pm to remind yourself to eat. 2.  Eat meal should have 3 components or be something that you would feed to a guest.  (This includes a protein choice)  Breakfast ideas:  1.  Biscotti, yogurt, fruit  2.  Granola with milk or yogurt and fruit   Try the "Game Changer Granola Recipe"  3.  Egg, toast, fruit  4.  Cream of wheat, vegetables with peanuts or cashews or other nuts.  5.  Oatmeal, walnuts, fruit  Consider simple meal planning on your own.  Consider adding coconut milk to your curry to increase your calories. Add beans (lentils, pigion peas, or other legumes) or tofu to your curry to increase the protein. Sodium considerations:  Goal less than 2000 mg per day (approximatly 600 mg per meal).  You do not need to count the milligrams.  Limit added salt, choose small amounts of unsalted nuts. Choose fresh and frozen vegetables without salt.  Have snacks with a small portion of protein  Examples include:   Apple with peanut butter   Peaches with cottage cheese   Banana and small handful of nuts   Hummus and vegetables and/or pita  Stay hydrated.   Consider 1000 units of vitamin D daily during the winter.

## 2019-11-01 NOTE — Progress Notes (Signed)
Medical Nutrition Therapy:  Appt start time: 4627 end time:  1600.   Assessment:  Primary concerns today: Patient is here today alone.  She states that she is frustrated as she cannot put on muscle.  She states that she tracked her intake for 1 day with an intake 1845 calories, 52 grams protein, 32 grams fiber. She states that this was a good day as far as eating. Based on patient history, the focus of today's exam will be rebuilding and improving nutritional status rather than restricting.  Patient with Orthorexia.  Mindfulness of CKD as well but less focus on this due to her restrictive history.  Patient wa referred for HTN, disordered eating with malnutrition and CKD stage 3.  History includes PTSD (watched a stranger commit suicide in [place]), anxiety, bipolar disorder on chronic lithium (now discontinued), hypothyroidism, anemia, GERD and HTN.  She is undergoing increased stress with family death and divorce, etc.  She has lost around 8 lbs in the past 6 months.  She reports some body dysmorphia "I don't look too bad."  But states that she is not concerned about losing weight.  She follows a vegetarian diet.  She states that she does not use a lot of added salt.    Weight today 106.8 lbs  She states that she was 108-109 lbs recently.   She states that she feels best at 115 lbs (with muscle).  She states that when she knew she had CKD she ate much more strict and lost to 105 lbs.  She states that she has a history of restrictive eating.  She at times will go to the grocery store and not buy different foods due to preservatives or desire to not be wasteful.  Labs include:  BUN 17, Creatinine 1.39, Potassium 4.4, Phosphorous 4.2, Vitamin D 66, eGFR 42 10/03/2019.  (Vitamin D increased this summer by laying out in the sun for 15 minutes daily.)  Patient lives with her son and her ex husband.  She states that this is temporary.  She is speaking to a realtor now.  They are sharing shopping and cooking.   She states that she never enjoyed cooking but is working at this better now with the help of videos on cooking.She has a music degree and plays classical guitar. She is on disability.  Preferred Learning Style:   No preference indicated   Learning Readiness:   Ready   MEDICATIONS: see list   DIETARY INTAKE:  Usual eating pattern includes 2-3 meals and 0 snacks per day. She states that her first husband was [place] and loves the vegetarian [place]. 24-hr recall:  B ( AM): 2 biscotti and 2 cups decaf tea with half and half  Snk ( AM): none  L ( PM): egg or cream of wheat and vegetables OR skips when she is "not paying attention" OR white rice and vegetables. Snk ( PM): none D ( PM): white rice and cooked vegetables ([place] Cuisine mostly), Chipoti bread Snk ( PM): none "gets heartburn if I eat too close to bed" Beverages: decaf tea with half and half, water, Izzy, rare juice  Usual physical activity: walks 2 times daily (quickly with dog), yoga daily, states that she does not stay still and enjoys working outside  Progress Towards Goal(s):  In progress.   Nutritional Diagnosis:  [place]-3.1 Underweight As related to restrictive eating.  As evidenced by BMI of 17.24.    Intervention:  Nutrition education/counseling provided.  Focussed on building and nourishing  rather than restrictive due to her history of restrictive eating habits and underweight.  Discussed basic meal planning.  Mindfulness of CKD discussed and need for continued low sodium diet.  Low sodium guidelines discussed briefly as well as amounts which liberalized available options.  PLAN: 1.  Avoid skipping meals - put an alarm on your phone at 12:30 pm to remind yourself to eat. 2.  Eat meal should have 3 components or be something that you would feed to a guest.  (This includes a protein choice)  Breakfast ideas:  1.  Biscotti, yogurt, fruit  2.  Granola with milk or yogurt and fruit   Try the "Game Changer  Granola Recipe"  3.  Egg, toast, fruit  4.  Cream of wheat, vegetables with peanuts or cashews or other nuts.  5.  Oatmeal, walnuts, fruit  Consider simple meal planning on your own.  Consider adding coconut milk to your curry to increase your calories. Add beans (lentils, pigion peas, or other legumes) or tofu to your curry to increase the protein. Sodium considerations:  Goal less than 2000 mg per day (approximatly 600 mg per meal).  You do not need to count the milligrams.  Limit added salt, choose small amounts of unsalted nuts. Choose fresh and frozen vegetables without salt.  Have snacks with a small portion of protein  Examples include:   Apple with peanut butter   Peaches with cottage cheese   Banana and small handful of nuts   Hummus and vegetables and/or pita  Stay hydrated.   Consider 1000 units of vitamin D daily during the winter.    Teaching Method Utilized:  Visual Auditory Hands on  Handouts given during visit include:  Snack list  Barriers to learning/adherence to lifestyle change: restrictive eating, stress  Demonstrated degree of understanding via:  Teach Back   Monitoring/Evaluation:  Dietary intake, exercise, and body weight in 1 month(s).

## 2019-11-05 DIAGNOSIS — F339 Major depressive disorder, recurrent, unspecified: Secondary | ICD-10-CM | POA: Diagnosis not present

## 2019-11-08 DIAGNOSIS — F909 Attention-deficit hyperactivity disorder, unspecified type: Secondary | ICD-10-CM | POA: Diagnosis not present

## 2019-11-08 DIAGNOSIS — F3181 Bipolar II disorder: Secondary | ICD-10-CM | POA: Diagnosis not present

## 2019-11-08 DIAGNOSIS — F509 Eating disorder, unspecified: Secondary | ICD-10-CM | POA: Diagnosis not present

## 2019-11-08 DIAGNOSIS — F4310 Post-traumatic stress disorder, unspecified: Secondary | ICD-10-CM | POA: Diagnosis not present

## 2019-11-12 DIAGNOSIS — F339 Major depressive disorder, recurrent, unspecified: Secondary | ICD-10-CM | POA: Diagnosis not present

## 2019-11-13 DIAGNOSIS — M533 Sacrococcygeal disorders, not elsewhere classified: Secondary | ICD-10-CM | POA: Diagnosis not present

## 2019-11-13 DIAGNOSIS — M79604 Pain in right leg: Secondary | ICD-10-CM | POA: Diagnosis not present

## 2019-11-19 DIAGNOSIS — F339 Major depressive disorder, recurrent, unspecified: Secondary | ICD-10-CM | POA: Diagnosis not present

## 2019-11-26 DIAGNOSIS — F339 Major depressive disorder, recurrent, unspecified: Secondary | ICD-10-CM | POA: Diagnosis not present

## 2019-12-03 DIAGNOSIS — F339 Major depressive disorder, recurrent, unspecified: Secondary | ICD-10-CM | POA: Diagnosis not present

## 2019-12-06 ENCOUNTER — Other Ambulatory Visit: Payer: Self-pay

## 2019-12-06 ENCOUNTER — Encounter: Payer: PPO | Attending: Nephrology | Admitting: Dietician

## 2019-12-06 DIAGNOSIS — F5089 Other specified eating disorder: Secondary | ICD-10-CM | POA: Diagnosis not present

## 2019-12-06 DIAGNOSIS — N1832 Chronic kidney disease, stage 3b: Secondary | ICD-10-CM | POA: Diagnosis not present

## 2019-12-06 NOTE — Progress Notes (Signed)
Medical Nutrition Therapy:  Appt start time: 1300 end time:  1330.   Assessment:  Primary concerns today: Patient is here today alone..  She states that she is working at getting a better rhythm.  She states that she is now moved in.  She does report increased stress which is now resolving.  She states that overall she has been eating better but has had a few poor days. Switched to margarine and is still scared of dairy. She is listening to her body more when she is hungry. Her appetite has improved. She has verbalized the need to gain weight and is happy when she feels full. Weight decreased about 1 lb in the past month 2 months to 105.6 lbs.  History: Patient wa referred for HTN, disordered eating with malnutrition and CKD stage 3.  History includes PTSD (watched a stranger commit suicide in [place]), anxiety, bipolar disorder on chronic lithium (now discontinued), hypothyroidism, anemia, and GERD.   She is undergoing increased stress with family death and divorce, etc.  She has lost around 8 lbs in the past 6 months.  She reports some body dysmorphia "I don't look too bad."  But states that she is not concerned about losing weight.  She follows a vegetarian diet.  She states that she does not use a lot of added salt.    Weight today 106.8 lbs  She states that she was 108-109 lbs recently.   She states that she feels best at 115 lbs (with muscle).  She states that when she knew she had CKD she ate much more strict and lost to 105 lbs.  She states that she has a history of restrictive eating.  She at times will go to the grocery store and not buy different foods due to preservatives or desire to not be wasteful.  Labs include:  BUN 17, Creatinine 1.39, Potassium 4.4, Phosphorous 4.2, Vitamin D 66, eGFR 42 10/03/2019.  (Vitamin D increased this summer by laying out in the sun for 15 minutes daily.)  Patient lives with her son and her ex husband.  She states that this is temporary.  She is  speaking to a realtor now.  They are sharing shopping and cooking.  She states that she never enjoyed cooking but is working at this better now with the help of videos on cooking.She has a music degree and plays classical guitar. She is on disability.  Preferred Learning Style:   No preference  Learning Readiness:   Contemplating  MEDICATIONS: see list   DIETARY INTAKE: Usual eating patterns includes 2-3 meals and occasional snacks.  She states that her first husband was [place] and loves the vegetarian [place]. 24-hr recall:  B ( AM): 3 biscotti, 2 cups tea with half and half, 1 cup oatmeal, raisins and cashews Snk ( AM):   L ( PM): cinnamon raisin bagel with margarine Snk ( PM): maple sugar candy D ( PM): rice, dahl, celery curry, batel with margarine, pumpkin pie Snk ( PM): cranberry walnut pound cake Beverages: tea with cream Usual physical activity: walking (has a pit mix)  Progress Towards Goal(s):  In progress.   Nutritional Diagnosis:  [place]-3.1 Underweight As related to restrictive eating.  As evidenced by diet hx, patient report, current weight.    Intervention:  Nutrition counseling/education continued. Encouraged her in the changes that she has made.  Reviewed tips to add calories with meals and snacks.  Reviewed appropriate portion sizes.  Encouraged 3 meals and 2 snacks daily.  Plan: Consider adding coconut milk to your curry to increase calories. Consider portion size goals. Breakfast, lunch, dinner plus snacks each day. Keep a granola bar and/or trail mix  (example) in your pocket/car/purse for snacks when your meals are delayed or when hungry.  Teaching Method Utilized:  Visual Auditory  Barriers to learning/adherence to lifestyle change: none  Demonstrated degree of understanding via:  Teach Back   Monitoring/Evaluation:  Dietary intake, exercise, and body weight in 2 month(s).

## 2019-12-06 NOTE — Patient Instructions (Addendum)
Consider adding coconut milk to your curry to increase calories. Consider portion size goals. Breakfast, lunch, dinner plus snacks each day. Keep a granola bar and/or trail mix  (example) in your pocket/car/purse for snacks when your meals are delayed or when hungry.

## 2019-12-10 DIAGNOSIS — F339 Major depressive disorder, recurrent, unspecified: Secondary | ICD-10-CM | POA: Diagnosis not present

## 2019-12-17 DIAGNOSIS — F339 Major depressive disorder, recurrent, unspecified: Secondary | ICD-10-CM | POA: Diagnosis not present

## 2019-12-25 DIAGNOSIS — F509 Eating disorder, unspecified: Secondary | ICD-10-CM | POA: Diagnosis not present

## 2019-12-25 DIAGNOSIS — F909 Attention-deficit hyperactivity disorder, unspecified type: Secondary | ICD-10-CM | POA: Diagnosis not present

## 2019-12-25 DIAGNOSIS — F3181 Bipolar II disorder: Secondary | ICD-10-CM | POA: Diagnosis not present

## 2019-12-25 DIAGNOSIS — F4310 Post-traumatic stress disorder, unspecified: Secondary | ICD-10-CM | POA: Diagnosis not present

## 2019-12-31 ENCOUNTER — Encounter: Payer: Self-pay | Admitting: Family Medicine

## 2019-12-31 ENCOUNTER — Other Ambulatory Visit: Payer: Self-pay

## 2019-12-31 ENCOUNTER — Ambulatory Visit (INDEPENDENT_AMBULATORY_CARE_PROVIDER_SITE_OTHER): Payer: PPO | Admitting: Family Medicine

## 2019-12-31 VITALS — BP 136/90 | HR 66 | Temp 98.1°F | Resp 17 | Ht 66.0 in | Wt 108.4 lb

## 2019-12-31 DIAGNOSIS — Z136 Encounter for screening for cardiovascular disorders: Secondary | ICD-10-CM | POA: Diagnosis not present

## 2019-12-31 DIAGNOSIS — R634 Abnormal weight loss: Secondary | ICD-10-CM | POA: Diagnosis not present

## 2019-12-31 DIAGNOSIS — R5383 Other fatigue: Secondary | ICD-10-CM

## 2019-12-31 DIAGNOSIS — F3132 Bipolar disorder, current episode depressed, moderate: Secondary | ICD-10-CM | POA: Diagnosis not present

## 2019-12-31 DIAGNOSIS — F329 Major depressive disorder, single episode, unspecified: Secondary | ICD-10-CM

## 2019-12-31 DIAGNOSIS — E559 Vitamin D deficiency, unspecified: Secondary | ICD-10-CM

## 2019-12-31 DIAGNOSIS — Z5181 Encounter for therapeutic drug level monitoring: Secondary | ICD-10-CM | POA: Diagnosis not present

## 2019-12-31 DIAGNOSIS — F339 Major depressive disorder, recurrent, unspecified: Secondary | ICD-10-CM

## 2019-12-31 DIAGNOSIS — N1832 Chronic kidney disease, stage 3b: Secondary | ICD-10-CM | POA: Diagnosis not present

## 2019-12-31 DIAGNOSIS — F4321 Adjustment disorder with depressed mood: Secondary | ICD-10-CM

## 2019-12-31 DIAGNOSIS — Z13220 Encounter for screening for lipoid disorders: Secondary | ICD-10-CM

## 2019-12-31 DIAGNOSIS — D649 Anemia, unspecified: Secondary | ICD-10-CM

## 2019-12-31 MED ORDER — NEXIUM 24HR 20 MG PO TBEC: 1.0000 | DELAYED_RELEASE_TABLET | Freq: Every day | ORAL | 11 refills | Status: DC

## 2019-12-31 NOTE — Patient Instructions (Signed)
° ° ° °  If you have lab work done today you will be contacted with your lab results within the next 2 weeks.  If you have not heard from us then please contact us. The fastest way to get your results is to register for My Chart. ° ° °IF you received an x-ray today, you will receive an invoice from [facility]. Please contact [facility] at [phone] with questions or concerns regarding your invoice.  ° °IF you received labwork today, you will receive an invoice from [facility]. Please contact [facility] at [phone] with questions or concerns regarding your invoice.  ° °Our billing staff will not be able to assist you with questions regarding bills from these companies. ° °You will be contacted with the lab results as soon as they are available. The fastest way to get your results is to activate your My Chart account. Instructions are located on the last page of this paperwork. If you have not heard from us regarding the results in 2 weeks, please contact this office. °  ° ° ° °

## 2019-12-31 NOTE — Progress Notes (Signed)
Established Patient Office Visit  Subjective:  Patient ID: Anne Little, female    DOB: 1961/10/14  Age: 59 y.o. MRN: [id number]  CC:  Chief Complaint  Patient presents with  . New Patient (Initial Visit)    establish care.  Psych doc has list of labs he wants drawn.  Elevated thyroid.Wants to see if she can get a script for the nexium.  Wants to get weight up, stage 3 kidney disease. Pneumonia in July and ringing and funny feeling after pneumonia.  Bone at bottom of spine and has made skin and butt irritated.    HPI Anne Little presents for    She was on lithium for Bipolar Disorder She states that due to the creatinine being elevated  She is taking gabapentin for her bipolar disorder She is mostly hypomanic She sees a therapist weekly and her psychiatry every month  She takes buproprion and gabapentin She reports that she currently was in bed with depression for 5 days She states that she has a history of anemia but thinks that her depression is making her so tired and depressed.  Grieving Her father died last year from advanced bladder cancer She and her sister were very close to him and his death was hard They are learning more about his last years of life and dealing with understanding what happened.  She reports that Christmas was hard.    Hypothyroidism She is taking levothyroxine 58mg She states that now she is off lithium and she states that her levels are stable She states that she is on propranolol as well. She was previously hyperthyroid but her dose was changed to 247m in August 2020.    Weight loss She reports that she is under stress but she is trying to eat and is still losing weight She is off lithium Her thyroid meds were adjusted due to her iatrogenic hyperthyroidism Her Psychiatrist is concerned about her weight loss and her low energy. Wt Readings from Last 3 Encounters:  12/31/19 108 lb 6.4 oz (49.2 kg)  11/01/19 106 lb 12.8 oz (48.4 kg)    06/07/18 115 lb (52.2 kg)   Patient reports that she has a history of vitamin D deficieny and anemia   Borderline Hypertension  She reports that her blood pressure has been increased with all the stress but she is watching her salt intake States that she is working hard to resume exercise BP Readings from Last 3 Encounters:  12/31/19 136/90  06/07/18 120/74  08/04/17 118/74     Past Medical History:  Diagnosis Date  . Allergy   . Anemia   . Anxiety   . Asthma    with seasonal allergies at times- mild  . Bipolar 1 disorder ([place] [place]  . Chronic post-traumatic stress disorder (PTSD) 02/11/2015  . Colon polyps   . Constipation   . Depression   . Endometriosis   . GERD (gastroesophageal reflux disease)   . Hemorrhoids    internal  . Hepatitis   . Hx of adenomatous colonic polyps   . Hyperlipidemia   . Hypothyroidism   . IBS (irritable bowel syndrome)   . Kidney disease   . Kidney stones   . Osteopenia 05/2019   T score -1.6 FRAX 6% / 0.6% stable from prior DEXA  . Parathyroid disease ([facility]  . PTSD (post-traumatic stress disorder)   . RLS (restless legs syndrome) 02/11/2015  . Thyroid disease     Past Surgical History:  Procedure Laterality Date  .  APPENDECTOMY    . COLONOSCOPY  2013   [facility]  . ECTOPIC PREGNANCY SURGERY    . OTHER SURGICAL HISTORY  1980   endometriosis/ectopic pregnancy  . POLYPECTOMY      Family History  Problem Relation Age of Onset  . Hyperlipidemia Mother   . Bladder Cancer Father   . Hyperlipidemia Father   . Hypertension Father   . Hyperlipidemia Sister   . Hyperlipidemia Maternal Grandmother   . Hypertension Maternal Grandmother   . Stroke Maternal Grandmother   . Mental illness Maternal Grandfather   . Alcoholism Maternal Grandfather   . Heart disease Paternal Grandmother   . Hyperlipidemia Paternal Grandmother   . Hypertension Paternal Grandmother   . Stroke Paternal Grandmother   . Cancer Paternal Grandfather        Colon? vs  stomach ?  . Colon cancer Maternal Aunt   . Ovarian cancer Maternal Aunt        Lynch syndrome  . Colon cancer Other   . Colon cancer Cousin   . Breast cancer Cousin   . Prostate cancer Cousin   . Colon polyps Neg Hx   . Rectal cancer Neg Hx   . Stomach cancer Neg Hx     Social History   Socioeconomic History  . Marital status: Divorced    Spouse name: Not on file  . Number of children: 2  . Years of education: college  . Highest education level: Not on file  Occupational History  . Not on file  Tobacco Use  . Smoking status: Former Smoker    Quit date: 12/27/1986    Years since quitting: 33.0  . Smokeless tobacco: Never Used  . Tobacco comment: quit smoking 26 years ago  Substance and Sexual Activity  . Alcohol use: Yes    Alcohol/week: 0.0 standard drinks    Comment: Rare  . Drug use: No    Comment: quit 1980  . Sexual activity: Yes    Comment: 1st intercourse 59 yo-More than 5 partners  Other Topics Concern  . Not on file  Social History Narrative   Caffeine 1 cup daily.   Social Determinants of Health   Financial Resource Strain:   . Difficulty of Paying Living Expenses: Not on file  Food Insecurity:   . Worried About Charity fundraiser in the Last Year: Not on file  . Ran Out of Food in the Last Year: Not on file  Transportation Needs:   . Lack of Transportation (Medical): Not on file  . Lack of Transportation (Non-Medical): Not on file  Physical Activity:   . Days of Exercise per Week: Not on file  . Minutes of Exercise per Session: Not on file  Stress:   . Feeling of Stress : Not on file  Social Connections:   . Frequency of Communication with Friends and Family: Not on file  . Frequency of Social Gatherings with Friends and Family: Not on file  . Attends Religious Services: Not on file  . Active Member of Clubs or Organizations: Not on file  . Attends Archivist Meetings: Not on file  . Marital Status: Not on file  Intimate Partner  Violence:   . Fear of Current or Ex-Partner: Not on file  . Emotionally Abused: Not on file  . Physically Abused: Not on file  . Sexually Abused: Not on file    Outpatient Medications Prior to Visit  Medication Sig Dispense Refill  . amLODipine (NORVASC) 5 MG tablet  Take by mouth daily.    Marland Kitchen buPROPion (WELLBUTRIN XL) 300 MG 24 hr tablet Take 300 mg by mouth daily.   0  . Docusate Calcium (STOOL SOFTENER PO) Take by mouth at bedtime.    . Esomeprazole Magnesium (NEXIUM 24HR) 20 MG TBEC Take 1 tablet by mouth daily.    . fluticasone (FLONASE) 50 MCG/ACT nasal spray USE 2 SPRAYS EACH NOSTRIL 1XD  3  . gabapentin (NEURONTIN) 300 MG capsule Take 3 capsules (900 mg total) by mouth at bedtime. agitation 90 capsule 0  . levothyroxine (SYNTHROID) 50 MCG tablet Take 50 mcg by mouth. Take 5 days a week    . lithium carbonate 300 MG capsule Take 300 mg by mouth daily. Take 1 cap in the evening    . propranolol (INDERAL) 10 MG tablet Take 10 mg by mouth 3 (three) times daily.     Facility-Administered Medications Prior to Visit  Medication Dose Route Frequency Provider Last Rate Last Admin  . 0.9 %  sodium chloride infusion  500 mL Intravenous Continuous Armbruster, Carlota Raspberry, MD        Allergies  Allergen Reactions  . Benzodiazepines Other (See Comments)    paradoxical effect   . Erythromycin Other (See Comments)    vomiting  . Macrolides And Ketolides Other (See Comments)  . Neomycin Swelling    "topical mycins"  . Penicillins Hives    Pt unsure if allergy, due to having seen an horrific sight and hives could be anxiety related.    . Trazodone And Nefazodone     Per pt she experiences a "paradoxical" reaction and shakiness with Trazodone in the past.   . Lamictal [Lamotrigine] Rash    ROS Review of Systems Review of Systems  Constitutional: Negative for activity change, appetite change, chills and fever.  HENT: Negative for congestion, nosebleeds, trouble swallowing and voice change.    Respiratory: Negative for cough, shortness of breath and wheezing.   Gastrointestinal: Negative for diarrhea, nausea and vomiting.  Genitourinary: Negative for difficulty urinating, dysuria, flank pain and hematuria.  Musculoskeletal: Negative for back pain, joint swelling and neck pain.  Neurological: Negative for dizziness, speech difficulty, light-headedness and numbness.  See HPI. All other review of systems negative.     Objective:    Physical Exam  BP 136/90 (BP Location: Right Arm, Patient Position: Sitting, Cuff Size: Normal)   Pulse 66   Temp 98.1 F (36.7 C) (Oral)   Resp 17   Ht _0  (1.676 m)   Wt 108 lb 6.4 oz (49.2 kg)   SpO2 100%   BMI 17.50 kg/m  Wt Readings from Last 3 Encounters:  12/31/19 108 lb 6.4 oz (49.2 kg)  11/01/19 106 lb 12.8 oz (48.4 kg)  06/07/18 115 lb (52.2 kg)   Physical Exam  Constitutional: Oriented to person, place, and time. Appears well-developed and well-nourished.  HENT:  Head: Normocephalic and atraumatic.  Eyes: Conjunctivae and EOM are normal.  Cardiovascular: Normal rate, regular rhythm, normal heart sounds and intact distal pulses.  No murmur heard. Pulmonary/Chest: Effort normal and breath sounds normal. No stridor. No respiratory distress. Has no wheezes.  Neurological: Is alert and oriented to person, place, and time. No tremors, patellar tendon reflex 2+ Skin: Skin is warm. Capillary refill takes less than 2 seconds.  Psychiatric: Has a normal mood and affect. Behavior is normal. Judgment and thought content normal.    Health Maintenance Due  Topic Date Due  . Hepatitis C Screening  1961-12-05  .  HIV Screening  12/09/1974  . INFLUENZA VACCINE  07/28/2019  . PAP SMEAR-Modifier  02/05/2020    There are no preventive care reminders to display for this patient.  Lab Results  Component Value Date   TSH 1.13 10/13/2016   Lab Results  Component Value Date   WBC 5.6 10/13/2016   HGB 12.7 10/13/2016   HCT 38.0  10/13/2016   MCV 92.9 10/13/2016   PLT 272 10/13/2016   Lab Results  Component Value Date   NA 144 02/13/2015   K 4.1 02/13/2015   CO2 27 02/13/2015   GLUCOSE 94 02/13/2015   BUN 11 02/13/2015   CREATININE 1.2 02/13/2015   BILITOT 0.60 02/13/2015   ALKPHOS 81 02/13/2015   AST 21 02/13/2015   ALT 16 02/13/2015   PROT 7.0 02/13/2015   ALBUMIN 3.5 02/13/2015   CALCIUM 9.5 02/13/2015   Lab Results  Component Value Date   CHOL 205 (H) 08/27/2014   Lab Results  Component Value Date   HDL 50 08/27/2014   Lab Results  Component Value Date   LDLCALC 128 (H) 08/27/2014   Lab Results  Component Value Date   TRIG 137 08/27/2014   Lab Results  Component Value Date   CHOLHDL 4.1 08/27/2014   No results found for: HGBA1C    Assessment & Plan:   Problem List Items Addressed This Visit      Other   BIPOLAR DISORDER UNSPECIFIED   Major depression, recurrent, chronic ([place]) - Primary multifactorial with grief and also med changes and thyroid changes   Relevant Orders   TSH   TSH + free T4   Vitamin D 25 (osteoporosis screening)   Vitamin B12   Fatigue due to depression   Relevant Orders   TSH   CMP14+EGFR   Hemoglobin A1c   CBC with Differential   Anemia   Relevant Orders   Vitamin B12    Other Visit Diagnoses    Unintentional weight loss    - despite increasing her diet   Relevant Orders   Hemoglobin A1c   TSH + free T4   Vitamin B12   Encounter for medication monitoring       Vitamin D deficiency    - previously low but resolved but with her fatigue is concerned about it being low   Relevant Orders   Vitamin D 25 (osteoporosis screening)   Encounter for lipid screening for cardiovascular disease       Relevant Orders   Lipid panel   Stage 3b chronic kidney disease    Discussed that she should avoid nephrotoxic meds like ibuprofen Will check the levels acutely   Relevant Orders   Lipid panel   CBC with Differential   Vitamin D 25 (osteoporosis  screening)   Grieving    -  Continue with counseling and Psychiatry      No orders of the defined types were placed in this encounter.   Follow-up: Return for return tomorrow for fasting labs.    Forrest Moron, MD

## 2019-12-31 NOTE — Addendum Note (Signed)
Addended by: Delia Chimes A on: 12/31/2019 03:24 PM   Modules accepted: Orders

## 2020-01-01 ENCOUNTER — Other Ambulatory Visit: Payer: Self-pay

## 2020-01-01 ENCOUNTER — Ambulatory Visit (INDEPENDENT_AMBULATORY_CARE_PROVIDER_SITE_OTHER): Payer: PPO | Admitting: Family Medicine

## 2020-01-01 DIAGNOSIS — R5383 Other fatigue: Secondary | ICD-10-CM

## 2020-01-01 DIAGNOSIS — Z13220 Encounter for screening for lipoid disorders: Secondary | ICD-10-CM

## 2020-01-01 DIAGNOSIS — R634 Abnormal weight loss: Secondary | ICD-10-CM

## 2020-01-01 DIAGNOSIS — E559 Vitamin D deficiency, unspecified: Secondary | ICD-10-CM

## 2020-01-01 DIAGNOSIS — D649 Anemia, unspecified: Secondary | ICD-10-CM

## 2020-01-01 DIAGNOSIS — Z136 Encounter for screening for cardiovascular disorders: Secondary | ICD-10-CM | POA: Diagnosis not present

## 2020-01-01 DIAGNOSIS — F32A Depression, unspecified: Secondary | ICD-10-CM

## 2020-01-01 DIAGNOSIS — F329 Major depressive disorder, single episode, unspecified: Secondary | ICD-10-CM | POA: Diagnosis not present

## 2020-01-01 DIAGNOSIS — N1832 Chronic kidney disease, stage 3b: Secondary | ICD-10-CM | POA: Diagnosis not present

## 2020-01-01 DIAGNOSIS — F339 Major depressive disorder, recurrent, unspecified: Secondary | ICD-10-CM | POA: Diagnosis not present

## 2020-01-02 LAB — CMP14+EGFR
ALT: 12 IU/L (ref 0–32)
AST: 16 IU/L (ref 0–40)
Albumin/Globulin Ratio: 2 (ref 1.2–2.2)
Albumin: 4.4 g/dL (ref 3.8–4.9)
Alkaline Phosphatase: 108 IU/L (ref 39–117)
BUN/Creatinine Ratio: 14 (ref 12–28)
BUN: 17 mg/dL (ref 8–27)
Bilirubin Total: 0.4 mg/dL (ref 0.0–1.2)
CO2: 25 mmol/L (ref 20–29)
Calcium: 9.6 mg/dL (ref 8.7–10.3)
Chloride: 101 mmol/L (ref 96–106)
Creatinine, Ser: 1.24 mg/dL — ABNORMAL HIGH (ref 0.57–1.00)
GFR calc Af Amer: 55 mL/min/{1.73_m2} — ABNORMAL LOW (ref >59)
GFR calc non Af Amer: 47 mL/min/{1.73_m2} — ABNORMAL LOW (ref >59)
Globulin, Total: 2.2 g/dL (ref 1.5–4.5)
Glucose: 86 mg/dL (ref 65–99)
Potassium: 4.5 mmol/L (ref 3.5–5.2)
Sodium: 138 mmol/L (ref 134–144)
Total Protein: 6.6 g/dL (ref 6.0–8.5)

## 2020-01-02 LAB — CBC WITH DIFFERENTIAL/PLATELET
Basophils Absolute: 0.1 10*3/uL (ref 0.0–0.2)
Basos: 2 %
EOS (ABSOLUTE): 0.2 10*3/uL (ref 0.0–0.4)
Eos: 5 %
Hematocrit: 35.9 % (ref 34.0–46.6)
Hemoglobin: 12.3 g/dL (ref 11.1–15.9)
Immature Grans (Abs): 0 10*3/uL (ref 0.0–0.1)
Immature Granulocytes: 0 %
Lymphocytes Absolute: 1.6 10*3/uL (ref 0.7–3.1)
Lymphs: 34 %
MCH: 31.1 pg (ref 26.6–33.0)
MCHC: 34.3 g/dL (ref 31.5–35.7)
MCV: 91 fL (ref 79–97)
Monocytes Absolute: 0.5 10*3/uL (ref 0.1–0.9)
Monocytes: 10 %
Neutrophils Absolute: 2.2 10*3/uL (ref 1.4–7.0)
Neutrophils: 49 %
Platelets: 247 10*3/uL (ref 150–450)
RBC: 3.96 x10E6/uL (ref 3.77–5.28)
RDW: 12.1 % (ref 11.7–15.4)
WBC: 4.5 10*3/uL (ref 3.4–10.8)

## 2020-01-02 LAB — VITAMIN D 25 HYDROXY (VIT D DEFICIENCY, FRACTURES): Vit D, 25-Hydroxy: 33.2 ng/mL (ref 30.0–100.0)

## 2020-01-02 LAB — LIPID PANEL
Chol/HDL Ratio: 2.8 ratio (ref 0.0–4.4)
Cholesterol, Total: 183 mg/dL (ref 100–199)
HDL: 65 mg/dL (ref >39)
LDL Chol Calc (NIH): 107 mg/dL — ABNORMAL HIGH (ref 0–99)
Triglycerides: 60 mg/dL (ref 0–149)
VLDL Cholesterol Cal: 11 mg/dL (ref 5–40)

## 2020-01-02 LAB — HEMOGLOBIN A1C
Est. average glucose Bld gHb Est-mCnc: 94 mg/dL
Hgb A1c MFr Bld: 4.9 % (ref 4.8–5.6)

## 2020-01-02 LAB — VITAMIN B12: Vitamin B-12: 397 pg/mL (ref 232–1245)

## 2020-01-02 LAB — TSH+FREE T4
Free T4: 1.63 ng/dL (ref 0.82–1.77)
TSH: 1.51 u[IU]/mL (ref 0.450–4.500)

## 2020-01-07 DIAGNOSIS — F339 Major depressive disorder, recurrent, unspecified: Secondary | ICD-10-CM | POA: Diagnosis not present

## 2020-01-14 DIAGNOSIS — F339 Major depressive disorder, recurrent, unspecified: Secondary | ICD-10-CM | POA: Diagnosis not present

## 2020-01-21 DIAGNOSIS — F339 Major depressive disorder, recurrent, unspecified: Secondary | ICD-10-CM | POA: Diagnosis not present

## 2020-01-28 DIAGNOSIS — F339 Major depressive disorder, recurrent, unspecified: Secondary | ICD-10-CM | POA: Diagnosis not present

## 2020-01-31 DIAGNOSIS — F909 Attention-deficit hyperactivity disorder, unspecified type: Secondary | ICD-10-CM | POA: Diagnosis not present

## 2020-01-31 DIAGNOSIS — F4310 Post-traumatic stress disorder, unspecified: Secondary | ICD-10-CM | POA: Diagnosis not present

## 2020-01-31 DIAGNOSIS — F3181 Bipolar II disorder: Secondary | ICD-10-CM | POA: Diagnosis not present

## 2020-01-31 DIAGNOSIS — F509 Eating disorder, unspecified: Secondary | ICD-10-CM | POA: Diagnosis not present

## 2020-02-04 DIAGNOSIS — F339 Major depressive disorder, recurrent, unspecified: Secondary | ICD-10-CM | POA: Diagnosis not present

## 2020-02-07 ENCOUNTER — Ambulatory Visit: Payer: PPO

## 2020-02-07 ENCOUNTER — Ambulatory Visit: Payer: PPO | Admitting: Dietician

## 2020-02-11 DIAGNOSIS — F339 Major depressive disorder, recurrent, unspecified: Secondary | ICD-10-CM | POA: Diagnosis not present

## 2020-02-13 ENCOUNTER — Telehealth: Payer: Self-pay | Admitting: Gastroenterology

## 2020-02-13 DIAGNOSIS — L738 Other specified follicular disorders: Secondary | ICD-10-CM | POA: Diagnosis not present

## 2020-02-13 DIAGNOSIS — D229 Melanocytic nevi, unspecified: Secondary | ICD-10-CM | POA: Diagnosis not present

## 2020-02-13 DIAGNOSIS — L821 Other seborrheic keratosis: Secondary | ICD-10-CM | POA: Diagnosis not present

## 2020-02-13 NOTE — Telephone Encounter (Signed)
Pt stated that she was diagnosed with chronic kidney disease and inquired whether probiotics would be advisable.

## 2020-02-13 NOTE — Telephone Encounter (Signed)
Returned patient's call. She had questions about her kidney diagnosis, which I referred to her nephrologist. She also had questions about her IBS, she has not had an office visit for almost 3 years. Scheduled office visit on 03/10/20 with Dr. Havery Moros

## 2020-02-15 ENCOUNTER — Ambulatory Visit: Payer: PPO | Admitting: Dietician

## 2020-02-18 DIAGNOSIS — F339 Major depressive disorder, recurrent, unspecified: Secondary | ICD-10-CM | POA: Diagnosis not present

## 2020-02-21 DIAGNOSIS — M25551 Pain in right hip: Secondary | ICD-10-CM | POA: Diagnosis not present

## 2020-02-21 DIAGNOSIS — R269 Unspecified abnormalities of gait and mobility: Secondary | ICD-10-CM | POA: Diagnosis not present

## 2020-02-21 DIAGNOSIS — M25552 Pain in left hip: Secondary | ICD-10-CM | POA: Diagnosis not present

## 2020-02-25 DIAGNOSIS — F339 Major depressive disorder, recurrent, unspecified: Secondary | ICD-10-CM | POA: Diagnosis not present

## 2020-02-26 DIAGNOSIS — R269 Unspecified abnormalities of gait and mobility: Secondary | ICD-10-CM | POA: Diagnosis not present

## 2020-02-26 DIAGNOSIS — M25552 Pain in left hip: Secondary | ICD-10-CM | POA: Diagnosis not present

## 2020-02-26 DIAGNOSIS — M25551 Pain in right hip: Secondary | ICD-10-CM | POA: Diagnosis not present

## 2020-02-28 DIAGNOSIS — M25551 Pain in right hip: Secondary | ICD-10-CM | POA: Diagnosis not present

## 2020-02-28 DIAGNOSIS — R269 Unspecified abnormalities of gait and mobility: Secondary | ICD-10-CM | POA: Diagnosis not present

## 2020-02-28 DIAGNOSIS — M25552 Pain in left hip: Secondary | ICD-10-CM | POA: Diagnosis not present

## 2020-03-03 DIAGNOSIS — F339 Major depressive disorder, recurrent, unspecified: Secondary | ICD-10-CM | POA: Diagnosis not present

## 2020-03-04 DIAGNOSIS — R269 Unspecified abnormalities of gait and mobility: Secondary | ICD-10-CM | POA: Diagnosis not present

## 2020-03-04 DIAGNOSIS — M25552 Pain in left hip: Secondary | ICD-10-CM | POA: Diagnosis not present

## 2020-03-04 DIAGNOSIS — M25551 Pain in right hip: Secondary | ICD-10-CM | POA: Diagnosis not present

## 2020-03-05 ENCOUNTER — Other Ambulatory Visit: Payer: Self-pay | Admitting: Women's Health

## 2020-03-05 DIAGNOSIS — Z1231 Encounter for screening mammogram for malignant neoplasm of breast: Secondary | ICD-10-CM

## 2020-03-06 ENCOUNTER — Ambulatory Visit: Payer: PPO | Admitting: Dietician

## 2020-03-06 DIAGNOSIS — M25551 Pain in right hip: Secondary | ICD-10-CM | POA: Diagnosis not present

## 2020-03-06 DIAGNOSIS — R269 Unspecified abnormalities of gait and mobility: Secondary | ICD-10-CM | POA: Diagnosis not present

## 2020-03-06 DIAGNOSIS — M25552 Pain in left hip: Secondary | ICD-10-CM | POA: Diagnosis not present

## 2020-03-10 ENCOUNTER — Ambulatory Visit: Payer: PPO | Admitting: Gastroenterology

## 2020-03-10 ENCOUNTER — Encounter: Payer: Self-pay | Admitting: Gastroenterology

## 2020-03-10 VITALS — BP 110/72 | HR 78 | Temp 98.3°F | Ht 66.0 in | Wt 118.0 lb

## 2020-03-10 DIAGNOSIS — F339 Major depressive disorder, recurrent, unspecified: Secondary | ICD-10-CM | POA: Diagnosis not present

## 2020-03-10 DIAGNOSIS — K5900 Constipation, unspecified: Secondary | ICD-10-CM

## 2020-03-10 DIAGNOSIS — R140 Abdominal distension (gaseous): Secondary | ICD-10-CM | POA: Diagnosis not present

## 2020-03-10 MED ORDER — POLYETHYLENE GLYCOL 3350 17 G PO PACK: 17.0000 g | PACK | Freq: Every day | ORAL | 0 refills | Status: DC

## 2020-03-10 NOTE — Progress Notes (Signed)
HPI :  59 year old female here for a follow-up visit for history of constipation and suspected IBS.  She states she has been having worsening issues with constipation lately.  Has a sense of incomplete evacuation like she cannot empty her self.  When this happens she feels bloated and has gas that bothers her.  If she is moving her bowels routinely then the symptoms are much better for her.  She does need to use some manual maneuvers at times to get stool out of her rectum when it is hard to empty.  Having a bowel movement usually makes her bloating and gas improved.  She is not been having any blood in her stools at all.  She previously was on fiber but has not been taking it.  Trying to eat a lot of fruits and vegetables to help with her bowels.  She does have stage III CKD which she states is stable from history of lithium use.  Her last colonoscopy was with me in 2018 and she had no polyps noted and only small hemorrhoids.  No other pathology appreciated.  Told to repeat a colonoscopy in 10 years at that time.  She has had a prior EGD which was negative for celiac disease in 2017.   Colonoscopy 04/05/2017 - The perianal and digital rectal examinations were normal. - The terminal ileum appeared normal. - Internal hemorrhoids were found during retroflexion. The hemorrhoids were small. - The exam was otherwise without abnormality.  Repeat exam in 10 years  EGD 06/15/2016 -  - The exam of the esophagus was otherwise normal. - The exam of the stomach was otherwise normal. No inflammatory changes or ulcerations were noted. - Biopsies were taken with a cold forceps in the gastric body and in the gastric antrum for Helicobacter pylori testing. - The duodenal bulb and second portion of the duodenum were normal. Biopsies for histology were taken with a cold forceps for evaluation of celiac disease.    Past Medical History:  Diagnosis Date  . Allergy   . Anemia   . Anxiety   . Asthma    with  seasonal allergies at times- mild  . Bipolar 1 disorder ([place])   . Chronic post-traumatic stress disorder (PTSD) 02/11/2015  . Colon polyps   . Constipation   . Depression   . Endometriosis   . GERD (gastroesophageal reflux disease)   . Hemorrhoids    internal  . Hepatitis   . Hx of adenomatous colonic polyps   . Hyperlipidemia   . Hypothyroidism   . IBS (irritable bowel syndrome)   . Kidney disease   . Kidney stones   . Osteopenia 05/2019   T score -1.6 FRAX 6% / 0.6% stable from prior DEXA  . 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 PTSD (post-traumatic stress disorder)   . RLS (restless legs syndrome) 02/11/2015  . Thyroid disease      Past Surgical History:  Procedure Laterality Date  . APPENDECTOMY    . COLONOSCOPY  2013   [facility]  . ECTOPIC PREGNANCY SURGERY    . OTHER SURGICAL HISTORY  1980   endometriosis/ectopic pregnancy  . POLYPECTOMY     Family History  Problem Relation Age of Onset  . Hyperlipidemia Mother   . Bladder Cancer Father   . Hyperlipidemia Father   . Hypertension Father   . Hyperlipidemia Sister   . Hyperlipidemia Maternal Grandmother   . Hypertension Maternal Grandmother   . Stroke Maternal Grandmother   . Mental illness  Maternal Grandfather   . Alcoholism Maternal Grandfather   . Heart disease Paternal Grandmother   . Hyperlipidemia Paternal Grandmother   . Hypertension Paternal Grandmother   . Stroke Paternal Grandmother   . Cancer Paternal Grandfather        Colon? vs stomach ?  . Colon cancer Maternal Aunt   . Ovarian cancer Maternal Aunt        Lynch syndrome  . Colon cancer Other   . Colon cancer Cousin   . Breast cancer Cousin   . Prostate cancer Cousin   . Colon polyps Neg Hx   . Rectal cancer Neg Hx   . Stomach cancer Neg Hx    Social History   Tobacco Use  . Smoking status: Former Smoker    Quit date: 12/27/1986    Years since quitting: 33.2  . Smokeless tobacco: Never Used  . Tobacco comment: quit smoking 26 years ago  Substance  Use Topics  . Alcohol use: Yes    Alcohol/week: 0.0 standard drinks    Comment: Rare  . Drug use: No    Comment: quit 1980   Current Outpatient Medications  Medication Sig Dispense Refill  . amLODipine (NORVASC) 5 MG tablet Take by mouth daily.    Marland Kitchen buPROPion (WELLBUTRIN XL) 150 MG 24 hr tablet Take 150 mg by mouth every morning.    . Esomeprazole Magnesium (NEXIUM 24HR) 20 MG TBEC Take 1 tablet by mouth daily. 30 tablet 11  . gabapentin (NEURONTIN) 300 MG capsule Take 3 capsules (900 mg total) by mouth at bedtime. agitation 90 capsule 0  . propranolol (INDERAL) 10 MG tablet Take 10 mg by mouth 3 (three) times daily.    Marland Kitchen SYNTHROID 25 MCG tablet Take 25 mcg by mouth every morning.     Current Facility-Administered Medications  Medication Dose Route Frequency Provider Last Rate Last Admin  . 0.9 %  sodium chloride infusion  500 mL Intravenous Continuous Riyaan Heroux, Carlota Raspberry, MD       Allergies  Allergen Reactions  . Benzodiazepines Other (See Comments)    paradoxical effect   . Erythromycin Other (See Comments)    vomiting  . Macrolides And Ketolides Other (See Comments)  . Neomycin Swelling    "topical mycins"  . Penicillins Hives    Pt unsure if allergy, due to having seen an horrific sight and hives could be anxiety related.    . Trazodone And Nefazodone     Per pt she experiences a "paradoxical" reaction and shakiness with Trazodone in the past.   . Lamictal [Lamotrigine] Rash     Review of Systems: All systems reviewed and negative except where noted in HPI.    Lab Results  Component Value Date   WBC 4.5 01/01/2020   HGB 12.3 01/01/2020   HCT 35.9 01/01/2020   MCV 91 01/01/2020   PLT 247 01/01/2020    Lab Results  Component Value Date   CREATININE 1.24 (H) 01/01/2020   BUN 17 01/01/2020   NA 138 01/01/2020   K 4.5 01/01/2020   CL 101 01/01/2020   CO2 25 01/01/2020   Lab Results  Component Value Date   ALT 12 01/01/2020   AST 16 01/01/2020   ALKPHOS  108 01/01/2020   BILITOT 0.4 01/01/2020     Physical Exam: BP 110/72   Pulse 78   Temp 98.3 F (36.8 C)   Ht 5\' 6"  (1.676 m)   Wt 118 lb (53.5 kg)   BMI 19.05 kg/m  Constitutional: Pleasant,well-developed, female in no acute distress. HEENT: Normocephalic and atraumatic. Conjunctivae are normal. No scleral icterus. Neck supple.  Cardiovascular: Normal rate, regular rhythm.  Pulmonary/chest: Effort normal and breath sounds normal. No wheezing, rales or rhonchi. Abdominal: Soft, nondistended, nontender. . There are no masses palpable. No hepatomegaly. DRE - CMA Julieanne Cotton as standby, normal resting tone, squeeze, normal decent Extremities: no edema Lymphadenopathy: No cervical adenopathy noted. Neurological: Alert and oriented to person place and time. Skin: Skin is warm and dry. No rashes noted. Psychiatric: Normal mood and affect. Behavior is normal.   ASSESSMENT AND PLAN: 59 year old female here for reassessment following issues:  Constipation / bloating - the constipation appears to be the cause of her bloating and gas that is bothersome to her.  Based on her history I thought she may have a component of pelvic floor dysfunction however her pelvic floor appears normal on exam today,  No obvious evidence of dyssynergia.  We will treat for slow transit constipation and see if this helps.  Recommend MiraLAX once daily and titrate up or down as needed.  I asked her to touch base with me in a few weeks to let me know how she is doing.  If this is too strong or she is having loose stools we can consider other options.  I am concerned that fiber may lead to worsening bloating and gas, and would avoid supplementation right now.  If symptoms persist despite this and continues to have sense of incomplete evacuation can consider manometry or empiric referral to pelvic floor PT however will hold off on that today given results of her DRE.  She agreed with the plan and can follow-up as needed  for this issue.  [place] Cellar, MD [facility]

## 2020-03-10 NOTE — Patient Instructions (Addendum)
If you are age 61 or older, your body mass index should be between 23-30. Your Body mass index is 19.05 kg/m. If this is out of the aforementioned range listed, please consider follow up with your Primary Care Provider.  If you are age 61 or younger, your body mass index should be between 19-25. Your Body mass index is 19.05 kg/m. If this is out of the aformentioned range listed, please consider follow up with your Primary Care Provider.   Please purchase the following medications over the counter and take as directed: Miralax: Use daily  Please let us know how you are doing in a couple of weeks.  Thank you for entrusting me with your care and for choosing [facility], Dr. [place] Cellar

## 2020-03-11 ENCOUNTER — Encounter: Payer: Self-pay | Admitting: Dietician

## 2020-03-11 ENCOUNTER — Other Ambulatory Visit: Payer: Self-pay

## 2020-03-11 ENCOUNTER — Encounter: Payer: PPO | Attending: Nephrology | Admitting: Dietician

## 2020-03-11 DIAGNOSIS — N1832 Chronic kidney disease, stage 3b: Secondary | ICD-10-CM | POA: Diagnosis not present

## 2020-03-11 DIAGNOSIS — F5089 Other specified eating disorder: Secondary | ICD-10-CM | POA: Insufficient documentation

## 2020-03-11 NOTE — Patient Instructions (Signed)
Continue the great changes that you are making. Listen to your body and eat when you are hungry.

## 2020-03-11 NOTE — Progress Notes (Signed)
Medical Nutrition Therapy:  Appt start time: 1700 end time:  1700.   Assessment:  Primary concerns today: Patient is here today alone for follow up of low weight and history of restrictive eating.  She has been eating whatever she wants. She states that she just had not been eating enough food. She has increased her portion size. She is snacking. Her appetite has been better.  She states that her Wellbutrin dose was decreased and maybe this helped.  She states that her mood is cycling the same as it usually was. Her weight has increased to 117 lbs today. (BMI now 18.8) Increased from 106.8 lbs on last RD visit 12/06/2019. She appears more relaxed than previous appointments. Noted that her vitamin D has decreased.  She reports taking 1000 units daily and previously increased this with sunbathing.  History: Patient wa referred for HTN, disordered eating with malnutrition and CKD stage 3. History includes PTSD (watched a stranger commit suicide in [place]), anxiety, bipolar disorder on chronic lithium (now discontinued), hypothyroidism, anemia, and GERD.   She is undergoing increased stress with family death and divorce, etc. She has lost around 8 lbs in the past 6 months. She reports some body dysmorphia "I don't look too bad." But states that she is not concerned about losing weight. She follows a vegetarian diet. She states that she does not use a lot of added salt.   Weight today 106.8 lbs She states that she was 108-109 lbs recently.  She states that she feels best at 115 lbs (with muscle). She states that when she knew she had CKD she ate much more strict and lost to 105 lbs.  She states that she has a history of restrictive eating. She at times will go to the grocery store and not buy different foods due to preservatives or desire to not be wasteful.  Labs include: BUN 17, Creatinine 1.24, eGFR 47, vitamin D 33, vitamin B-12 397 01/01/20  Patient lives with her son and her ex  husband. She states that this is temporary. She is speaking to a realtor now. They are sharing shopping and cooking. She states that she never enjoyed cooking but is working at this better now with the help of videos on cooking.She has a music degree and plays classical guitar. She is on disability.  Preferred Learning Style:   No preference indicated   Learning Readiness:   Change in progress  Ready    MEDICATIONS: see list to include 1000 units of vitamin D   DIETARY INTAKE: Usual eating pattern includes 3 meals meals and occasional snacks per day. Her first husband was from [place] and she loves the vegetarian [place]. 24-hr recall:  B ( AM): 3 Biscotti with tea, oatmeal, fruit, cashews  Snk ( AM):  L ( PM): leftovers or sandwich Snk ( PM): bagel or LS pretzels in almond butter D ( PM): vegetarian [place] meal (rice, curry, dahl, soup), occasional yogurt (homemade with 2% milk) Snk ( PM): none Beverages: water  Usual physical activity: walking about a 5K daily but recently had hip issues and now sees a PT  Progress Towards Goal(s):  In progress.   Nutritional Diagnosis:  [place]-3.1 Underweight As related to restrictive eating.  As evidenced by diet hx and patient report.    Intervention:  Nutrition counseling/education continued.  Encouraged her in the changes that she has made.  Discussed body image and patient does not voice concerns at this time and is pleased about weight gain.  Plan: Continue the great changes that you are making. Listen to your body and eat when you are hungry.   Teaching Method Utilized:  Auditory   Barriers to learning/adherence to lifestyle change: none  Demonstrated degree of understanding via:  Teach Back   Monitoring/Evaluation:  Dietary intake, exercise, and body weight in 3 month(s).

## 2020-03-14 DIAGNOSIS — R269 Unspecified abnormalities of gait and mobility: Secondary | ICD-10-CM | POA: Diagnosis not present

## 2020-03-14 DIAGNOSIS — M25551 Pain in right hip: Secondary | ICD-10-CM | POA: Diagnosis not present

## 2020-03-14 DIAGNOSIS — M25552 Pain in left hip: Secondary | ICD-10-CM | POA: Diagnosis not present

## 2020-03-17 DIAGNOSIS — F339 Major depressive disorder, recurrent, unspecified: Secondary | ICD-10-CM | POA: Diagnosis not present

## 2020-03-24 DIAGNOSIS — F339 Major depressive disorder, recurrent, unspecified: Secondary | ICD-10-CM | POA: Diagnosis not present

## 2020-03-25 DIAGNOSIS — M25552 Pain in left hip: Secondary | ICD-10-CM | POA: Diagnosis not present

## 2020-03-25 DIAGNOSIS — R269 Unspecified abnormalities of gait and mobility: Secondary | ICD-10-CM | POA: Diagnosis not present

## 2020-03-25 DIAGNOSIS — M25551 Pain in right hip: Secondary | ICD-10-CM | POA: Diagnosis not present

## 2020-03-31 DIAGNOSIS — F339 Major depressive disorder, recurrent, unspecified: Secondary | ICD-10-CM | POA: Diagnosis not present

## 2020-04-01 DIAGNOSIS — R269 Unspecified abnormalities of gait and mobility: Secondary | ICD-10-CM | POA: Diagnosis not present

## 2020-04-01 DIAGNOSIS — M25552 Pain in left hip: Secondary | ICD-10-CM | POA: Diagnosis not present

## 2020-04-01 DIAGNOSIS — M25551 Pain in right hip: Secondary | ICD-10-CM | POA: Diagnosis not present

## 2020-04-07 ENCOUNTER — Other Ambulatory Visit: Payer: Self-pay

## 2020-04-07 DIAGNOSIS — F339 Major depressive disorder, recurrent, unspecified: Secondary | ICD-10-CM | POA: Diagnosis not present

## 2020-04-08 ENCOUNTER — Ambulatory Visit: Payer: PPO | Admitting: Women's Health

## 2020-04-08 ENCOUNTER — Encounter: Payer: Self-pay | Admitting: Women's Health

## 2020-04-08 VITALS — BP 120/74 | Ht 66.0 in | Wt 112.0 lb

## 2020-04-08 DIAGNOSIS — M8580 Other specified disorders of bone density and structure, unspecified site: Secondary | ICD-10-CM | POA: Diagnosis not present

## 2020-04-08 DIAGNOSIS — Z01419 Encounter for gynecological examination (general) (routine) without abnormal findings: Secondary | ICD-10-CM

## 2020-04-08 DIAGNOSIS — R638 Other symptoms and signs concerning food and fluid intake: Secondary | ICD-10-CM

## 2020-04-08 DIAGNOSIS — Z780 Asymptomatic menopausal state: Secondary | ICD-10-CM | POA: Diagnosis not present

## 2020-04-08 LAB — URINALYSIS, COMPLETE W/RFL CULTURE
Bacteria, UA: NONE SEEN /HPF
Bilirubin Urine: NEGATIVE
Glucose, UA: NEGATIVE
Hgb urine dipstick: NEGATIVE
Hyaline Cast: NONE SEEN /LPF
Ketones, ur: NEGATIVE
Leukocyte Esterase: NEGATIVE
Nitrites, Initial: NEGATIVE
Protein, ur: NEGATIVE
RBC / HPF: NONE SEEN /HPF (ref 0–2)
Specific Gravity, Urine: 1.009 (ref 1.001–1.03)
Squamous Epithelial / HPF: NONE SEEN /HPF (ref <=5)
WBC, UA: NONE SEEN /HPF (ref 0–5)
pH: <=5 (ref 5.0–8.0)

## 2020-04-08 LAB — NO CULTURE INDICATED

## 2020-04-08 NOTE — Patient Instructions (Signed)
Vitamin D 2000 IUs daily Shingrex 2 series vaccine can get at  pharmacy for shingles prevention Pleasure meeting you today Try yoga Health Maintenance for Postmenopausal Women Menopause is a normal process in which your ability to get pregnant comes to an end. This process happens slowly over many months or years, usually between the ages of 37 and 32. Menopause is complete when you have missed your menstrual periods for 12 months. It is important to talk with your health care provider about some of the most common conditions that affect women after menopause (postmenopausal women). These include heart disease, cancer, and bone loss (osteoporosis). Adopting a healthy lifestyle and getting preventive care can help to promote your health and wellness. The actions you take can also lower your chances of developing some of these common conditions. What should I know about menopause? During menopause, you may get a number of symptoms, such as:  Hot flashes. These can be moderate or severe.  Night sweats.  Decrease in sex drive.  Mood swings.  Headaches.  Tiredness.  Irritability.  Memory problems.  Insomnia. Choosing to treat or not to treat these symptoms is a decision that you make with your health care provider. Do I need hormone replacement therapy?  Hormone replacement therapy is effective in treating symptoms that are caused by menopause, such as hot flashes and night sweats.  Hormone replacement carries certain risks, especially as you become older. If you are thinking about using estrogen or estrogen with progestin, discuss the benefits and risks with your health care provider. What is my risk for heart disease and stroke? The risk of heart disease, heart attack, and stroke increases as you age. One of the causes may be a change in the body's hormones during menopause. This can affect how your body uses dietary fats, triglycerides, and cholesterol. Heart attack and stroke are  medical emergencies. There are many things that you can do to help prevent heart disease and stroke. Watch your blood pressure  High blood pressure causes heart disease and increases the risk of stroke. This is more likely to develop in people who have high blood pressure readings, are of African descent, or are overweight.  Have your blood pressure checked: ? Every 3-5 years if you are 45-13 years of age. ? Every year if you are 83 years old or older. Eat a healthy diet   Eat a diet that includes plenty of vegetables, fruits, low-fat dairy products, and lean protein.  Do not eat a lot of foods that are high in solid fats, added sugars, or sodium. Get regular exercise Get regular exercise. This is one of the most important things you can do for your health. Most adults should:  Try to exercise for at least 150 minutes each week. The exercise should increase your heart rate and make you sweat (moderate-intensity exercise).  Try to do strengthening exercises at least twice each week. Do these in addition to the moderate-intensity exercise.  Spend less time sitting. Even light physical activity can be beneficial. Other tips  Work with your health care provider to achieve or maintain a healthy weight.  Do not use any products that contain nicotine or tobacco, such as cigarettes, e-cigarettes, and chewing tobacco. If you need help quitting, ask your health care provider.  Know your numbers. Ask your health care provider to check your cholesterol and your blood sugar (glucose). Continue to have your blood tested as directed by your health care provider. Do I need screening for  cancer? Depending on your health history and family history, you may need to have cancer screening at different stages of your life. This may include screening for:  Breast cancer.  Cervical cancer.  Lung cancer.  Colorectal cancer. What is my risk for osteoporosis? After menopause, you may be at increased  risk for osteoporosis. Osteoporosis is a condition in which bone destruction happens more quickly than new bone creation. To help prevent osteoporosis or the bone fractures that can happen because of osteoporosis, you may take the following actions:  If you are 19-50 years old, get at least 1,000 mg of calcium and at least 600 mg of vitamin D per day.  If you are older than age 50 but younger than age 70, get at least 1,200 mg of calcium and at least 600 mg of vitamin D per day.  If you are older than age 70, get at least 1,200 mg of calcium and at least 800 mg of vitamin D per day. Smoking and drinking excessive alcohol increase the risk of osteoporosis. Eat foods that are rich in calcium and vitamin D, and do weight-bearing exercises several times each week as directed by your health care provider. How does menopause affect my mental health? Depression may occur at any age, but it is more common as you become older. Common symptoms of depression include:  Low or sad mood.  Changes in sleep patterns.  Changes in appetite or eating patterns.  Feeling an overall lack of motivation or enjoyment of activities that you previously enjoyed.  Frequent crying spells. Talk with your health care provider if you think that you are experiencing depression. General instructions See your health care provider for regular wellness exams and vaccines. This may include:  Scheduling regular health, dental, and eye exams.  Getting and maintaining your vaccines. These include: ? Influenza vaccine. Get this vaccine each year before the flu season begins. ? Pneumonia vaccine. ? Shingles vaccine. ? Tetanus, diphtheria, and pertussis (Tdap) booster vaccine. Your health care provider may also recommend other immunizations. Tell your health care provider if you have ever been abused or do not feel safe at home. Summary  Menopause is a normal process in which your ability to get pregnant comes to an  end.  This condition causes hot flashes, night sweats, decreased interest in sex, mood swings, headaches, or lack of sleep.  Treatment for this condition may include hormone replacement therapy.  Take actions to keep yourself healthy, including exercising regularly, eating a healthy diet, watching your weight, and checking your blood pressure and blood sugar levels.  Get screened for cancer and depression. Make sure that you are up to date with all your vaccines. This information is not intended to replace advice given to you by your health care provider. Make sure you discuss any questions you have with your health care provider. Document Revised: 12/06/2018 Document Reviewed: 12/06/2018 Elsevier Patient Education  2020 Elsevier Inc.  

## 2020-04-08 NOTE — Progress Notes (Signed)
Tamsin Failing December 29, 1960 [id number]    History:    Presents for breast and pelvic exam with no complaints of vaginal discharge, urinary symptoms, dyspareunia.  Postmenopausal on no HRT with no bleeding.  Normal Pap and mammogram history.  On disability, chronic hip pain.  Primary care manages hypothyroidism, anxiety and depression.  GI follows GERD and IBS.  2018 - colonoscopy.  06/2019 DEXA at [facility] T score -1.6 FRAX 6.3% / 0.6%, stable.  Has not had Shingrix.  Struggles with poor appetite, weight is stable.  Not sexually active/years denies need for STD screen.  Past medical history, past surgical history, family history and social history were all reviewed and documented in the EPIC chart.  2 children ages 22 and 60 both doing well 1lives in [place] 1 in [place].  ROS:  A ROS was performed and pertinent positives and negatives are included.  Exam:  Vitals:   04/08/20 1141  BP: 120/74  Weight: 112 lb (50.8 kg)  Height: 5\' 6"  (1.676 m)   Body mass index is 18.08 kg/m.   General appearance:  Normal Thyroid:  Symmetrical, normal in size, without palpable masses or nodularity. Respiratory  Auscultation:  Clear without wheezing or rhonchi Cardiovascular  Auscultation:  Regular rate, without rubs, murmurs or gallops  Edema/varicosities:  Not grossly evident Abdominal  Soft,nontender, without masses, guarding or rebound.  Liver/spleen:  No organomegaly noted  Hernia:  None appreciated  Skin  Inspection:  Grossly normal   Breasts: Examined lying and sitting.     Right: Without masses, retractions, discharge or axillary adenopathy.     Left: Without masses, retractions, discharge or axillary adenopathy. Gentitourinary   Inguinal/mons:  Normal without inguinal adenopathy  External genitalia:  Normal  BUS/Urethra/Skene's glands:  Normal  Vagina:  Normal  Cervix:  Normal  Uterus:  normal in size, shape and contour.  Midline and mobile  Adnexa/parametria:     Rt: Without masses or  tenderness.   Lt: Without masses or tenderness.  Anus and perineum: Normal  Digital rectal exam: Normal sphincter tone without palpated masses or tenderness  Assessment/Plan:  59 y.o. D WF G3, P2 for breast and pelvic exam with no GYN complaints.   Postmenopausal on no HRT with no bleeding. 06/2019 osteopenia without elevated FRAX Hypothyroid, hypertension, anxiety/depression-primary care manages labs and meds GERD/IBS-GI manages has follow-up scheduled  Plan: Shingrex reviewed and encouraged after 6 months of completing Covid vaccine which has recently had.  Continue annual screening mammogram, calcium rich foods, vitamin D 2000 IUs daily encouraged.  Aware of importance of weightbearing and balance type exercise, easy, senior or chair yoga encouraged.  Home safety, fall prevention discussed .  Leisure activities and self-care encouraged.  Pap, Pap normal 2018,    Huel Cote [facility], 11:46 AM 04/08/2020

## 2020-04-09 LAB — PAP IG W/ RFLX HPV ASCU

## 2020-04-14 DIAGNOSIS — F339 Major depressive disorder, recurrent, unspecified: Secondary | ICD-10-CM | POA: Diagnosis not present

## 2020-04-21 DIAGNOSIS — F339 Major depressive disorder, recurrent, unspecified: Secondary | ICD-10-CM | POA: Diagnosis not present

## 2020-04-28 DIAGNOSIS — F339 Major depressive disorder, recurrent, unspecified: Secondary | ICD-10-CM | POA: Diagnosis not present

## 2020-05-02 ENCOUNTER — Ambulatory Visit: Payer: PPO

## 2020-05-05 ENCOUNTER — Other Ambulatory Visit: Payer: Self-pay

## 2020-05-05 ENCOUNTER — Ambulatory Visit
Admission: RE | Admit: 2020-05-05 | Discharge: 2020-05-05 | Disposition: A | Discharge status: Home / Self Care | Payer: PPO | Source: Ambulatory Visit | Attending: Women's Health | Admitting: Women's Health

## 2020-05-05 DIAGNOSIS — E213 Hyperparathyroidism, unspecified: Secondary | ICD-10-CM | POA: Diagnosis not present

## 2020-05-05 DIAGNOSIS — F339 Major depressive disorder, recurrent, unspecified: Secondary | ICD-10-CM | POA: Diagnosis not present

## 2020-05-05 DIAGNOSIS — M899 Disorder of bone, unspecified: Secondary | ICD-10-CM | POA: Diagnosis not present

## 2020-05-05 DIAGNOSIS — E559 Vitamin D deficiency, unspecified: Secondary | ICD-10-CM | POA: Diagnosis not present

## 2020-05-05 DIAGNOSIS — Z1231 Encounter for screening mammogram for malignant neoplasm of breast: Secondary | ICD-10-CM | POA: Diagnosis not present

## 2020-05-05 DIAGNOSIS — E039 Hypothyroidism, unspecified: Secondary | ICD-10-CM | POA: Diagnosis not present

## 2020-05-22 DIAGNOSIS — F339 Major depressive disorder, recurrent, unspecified: Secondary | ICD-10-CM | POA: Diagnosis not present

## 2020-05-29 DIAGNOSIS — I10 Essential (primary) hypertension: Secondary | ICD-10-CM | POA: Diagnosis not present

## 2020-05-29 DIAGNOSIS — E559 Vitamin D deficiency, unspecified: Secondary | ICD-10-CM | POA: Diagnosis not present

## 2020-05-29 DIAGNOSIS — E039 Hypothyroidism, unspecified: Secondary | ICD-10-CM | POA: Diagnosis not present

## 2020-05-29 DIAGNOSIS — E213 Hyperparathyroidism, unspecified: Secondary | ICD-10-CM | POA: Diagnosis not present

## 2020-05-29 DIAGNOSIS — M899 Disorder of bone, unspecified: Secondary | ICD-10-CM | POA: Diagnosis not present

## 2020-06-02 DIAGNOSIS — F339 Major depressive disorder, recurrent, unspecified: Secondary | ICD-10-CM | POA: Diagnosis not present

## 2020-06-09 DIAGNOSIS — F339 Major depressive disorder, recurrent, unspecified: Secondary | ICD-10-CM | POA: Diagnosis not present

## 2020-06-10 DIAGNOSIS — H524 Presbyopia: Secondary | ICD-10-CM | POA: Diagnosis not present

## 2020-06-12 ENCOUNTER — Ambulatory Visit: Payer: PPO | Admitting: Dietician

## 2020-06-19 DIAGNOSIS — F339 Major depressive disorder, recurrent, unspecified: Secondary | ICD-10-CM | POA: Diagnosis not present

## 2020-06-23 DIAGNOSIS — F339 Major depressive disorder, recurrent, unspecified: Secondary | ICD-10-CM | POA: Diagnosis not present

## 2020-07-04 DIAGNOSIS — F3181 Bipolar II disorder: Secondary | ICD-10-CM | POA: Diagnosis not present

## 2020-07-04 DIAGNOSIS — F5082 Avoidant/restrictive food intake disorder: Secondary | ICD-10-CM | POA: Diagnosis not present

## 2020-07-04 DIAGNOSIS — R41840 Attention and concentration deficit: Secondary | ICD-10-CM | POA: Diagnosis not present

## 2020-07-07 DIAGNOSIS — F339 Major depressive disorder, recurrent, unspecified: Secondary | ICD-10-CM | POA: Diagnosis not present

## 2020-07-08 DIAGNOSIS — M859 Disorder of bone density and structure, unspecified: Secondary | ICD-10-CM | POA: Diagnosis not present

## 2020-07-08 DIAGNOSIS — I130 Hypertensive heart and chronic kidney disease with heart failure and stage 1 through stage 4 chronic kidney disease, or unspecified chronic kidney disease: Secondary | ICD-10-CM | POA: Diagnosis not present

## 2020-07-08 DIAGNOSIS — E038 Other specified hypothyroidism: Secondary | ICD-10-CM | POA: Diagnosis not present

## 2020-07-14 DIAGNOSIS — F339 Major depressive disorder, recurrent, unspecified: Secondary | ICD-10-CM | POA: Diagnosis not present

## 2020-07-15 DIAGNOSIS — J309 Allergic rhinitis, unspecified: Secondary | ICD-10-CM | POA: Diagnosis not present

## 2020-07-15 DIAGNOSIS — K219 Gastro-esophageal reflux disease without esophagitis: Secondary | ICD-10-CM | POA: Diagnosis not present

## 2020-07-15 DIAGNOSIS — N1831 Chronic kidney disease, stage 3a: Secondary | ICD-10-CM | POA: Diagnosis not present

## 2020-07-15 DIAGNOSIS — F319 Bipolar disorder, unspecified: Secondary | ICD-10-CM | POA: Diagnosis not present

## 2020-07-15 DIAGNOSIS — E211 Secondary hyperparathyroidism, not elsewhere classified: Secondary | ICD-10-CM | POA: Diagnosis not present

## 2020-07-15 DIAGNOSIS — M8580 Other specified disorders of bone density and structure, unspecified site: Secondary | ICD-10-CM | POA: Diagnosis not present

## 2020-07-15 DIAGNOSIS — E039 Hypothyroidism, unspecified: Secondary | ICD-10-CM | POA: Diagnosis not present

## 2020-07-15 DIAGNOSIS — Z0000 Encounter for general adult medical examination without abnormal findings: Secondary | ICD-10-CM | POA: Diagnosis not present

## 2020-07-15 DIAGNOSIS — D649 Anemia, unspecified: Secondary | ICD-10-CM | POA: Diagnosis not present

## 2020-07-15 DIAGNOSIS — Z1212 Encounter for screening for malignant neoplasm of rectum: Secondary | ICD-10-CM | POA: Diagnosis not present

## 2020-07-15 DIAGNOSIS — I130 Hypertensive heart and chronic kidney disease with heart failure and stage 1 through stage 4 chronic kidney disease, or unspecified chronic kidney disease: Secondary | ICD-10-CM | POA: Diagnosis not present

## 2020-07-15 DIAGNOSIS — J45909 Unspecified asthma, uncomplicated: Secondary | ICD-10-CM | POA: Diagnosis not present

## 2020-07-15 DIAGNOSIS — R82998 Other abnormal findings in urine: Secondary | ICD-10-CM | POA: Diagnosis not present

## 2020-07-15 DIAGNOSIS — F509 Eating disorder, unspecified: Secondary | ICD-10-CM | POA: Diagnosis not present

## 2020-07-28 DIAGNOSIS — F339 Major depressive disorder, recurrent, unspecified: Secondary | ICD-10-CM | POA: Diagnosis not present

## 2020-08-08 DIAGNOSIS — H16141 Punctate keratitis, right eye: Secondary | ICD-10-CM | POA: Diagnosis not present

## 2020-08-08 DIAGNOSIS — F339 Major depressive disorder, recurrent, unspecified: Secondary | ICD-10-CM | POA: Diagnosis not present

## 2020-08-11 DIAGNOSIS — F339 Major depressive disorder, recurrent, unspecified: Secondary | ICD-10-CM | POA: Diagnosis not present

## 2020-08-18 DIAGNOSIS — F339 Major depressive disorder, recurrent, unspecified: Secondary | ICD-10-CM | POA: Diagnosis not present

## 2020-08-25 DIAGNOSIS — F339 Major depressive disorder, recurrent, unspecified: Secondary | ICD-10-CM | POA: Diagnosis not present

## 2020-09-08 DIAGNOSIS — F339 Major depressive disorder, recurrent, unspecified: Secondary | ICD-10-CM | POA: Diagnosis not present

## 2020-09-09 DIAGNOSIS — F5082 Avoidant/restrictive food intake disorder: Secondary | ICD-10-CM | POA: Diagnosis not present

## 2020-09-09 DIAGNOSIS — F3181 Bipolar II disorder: Secondary | ICD-10-CM | POA: Diagnosis not present

## 2020-09-09 DIAGNOSIS — R41840 Attention and concentration deficit: Secondary | ICD-10-CM | POA: Diagnosis not present

## 2020-09-15 DIAGNOSIS — R69 Illness, unspecified: Secondary | ICD-10-CM | POA: Diagnosis not present

## 2020-09-16 DIAGNOSIS — N1831 Chronic kidney disease, stage 3a: Secondary | ICD-10-CM | POA: Diagnosis not present

## 2020-09-16 DIAGNOSIS — Z1152 Encounter for screening for COVID-19: Secondary | ICD-10-CM | POA: Diagnosis not present

## 2020-09-16 DIAGNOSIS — J302 Other seasonal allergic rhinitis: Secondary | ICD-10-CM | POA: Diagnosis not present

## 2020-09-16 DIAGNOSIS — F319 Bipolar disorder, unspecified: Secondary | ICD-10-CM | POA: Diagnosis not present

## 2020-09-16 DIAGNOSIS — R05 Cough: Secondary | ICD-10-CM | POA: Diagnosis not present

## 2020-09-16 DIAGNOSIS — I130 Hypertensive heart and chronic kidney disease with heart failure and stage 1 through stage 4 chronic kidney disease, or unspecified chronic kidney disease: Secondary | ICD-10-CM | POA: Diagnosis not present

## 2020-09-22 DIAGNOSIS — F3181 Bipolar II disorder: Secondary | ICD-10-CM | POA: Diagnosis not present

## 2020-09-22 DIAGNOSIS — F339 Major depressive disorder, recurrent, unspecified: Secondary | ICD-10-CM | POA: Diagnosis not present

## 2020-09-29 DIAGNOSIS — F339 Major depressive disorder, recurrent, unspecified: Secondary | ICD-10-CM | POA: Diagnosis not present

## 2020-10-06 DIAGNOSIS — R69 Illness, unspecified: Secondary | ICD-10-CM | POA: Diagnosis not present

## 2020-10-09 DIAGNOSIS — N183 Chronic kidney disease, stage 3 unspecified: Secondary | ICD-10-CM | POA: Diagnosis not present

## 2020-10-09 DIAGNOSIS — N189 Chronic kidney disease, unspecified: Secondary | ICD-10-CM | POA: Diagnosis not present

## 2020-10-13 DIAGNOSIS — F339 Major depressive disorder, recurrent, unspecified: Secondary | ICD-10-CM | POA: Diagnosis not present

## 2020-10-16 DIAGNOSIS — Z23 Encounter for immunization: Secondary | ICD-10-CM | POA: Diagnosis not present

## 2020-10-16 DIAGNOSIS — D631 Anemia in chronic kidney disease: Secondary | ICD-10-CM | POA: Diagnosis not present

## 2020-10-16 DIAGNOSIS — E46 Unspecified protein-calorie malnutrition: Secondary | ICD-10-CM | POA: Diagnosis not present

## 2020-10-16 DIAGNOSIS — N2581 Secondary hyperparathyroidism of renal origin: Secondary | ICD-10-CM | POA: Diagnosis not present

## 2020-10-16 DIAGNOSIS — N183 Chronic kidney disease, stage 3 unspecified: Secondary | ICD-10-CM | POA: Diagnosis not present

## 2020-10-16 DIAGNOSIS — I129 Hypertensive chronic kidney disease with stage 1 through stage 4 chronic kidney disease, or unspecified chronic kidney disease: Secondary | ICD-10-CM | POA: Diagnosis not present

## 2020-10-20 DIAGNOSIS — F339 Major depressive disorder, recurrent, unspecified: Secondary | ICD-10-CM | POA: Diagnosis not present

## 2020-10-23 DIAGNOSIS — F5082 Avoidant/restrictive food intake disorder: Secondary | ICD-10-CM | POA: Diagnosis not present

## 2020-10-23 DIAGNOSIS — R41840 Attention and concentration deficit: Secondary | ICD-10-CM | POA: Diagnosis not present

## 2020-10-23 DIAGNOSIS — F3181 Bipolar II disorder: Secondary | ICD-10-CM | POA: Diagnosis not present

## 2020-11-10 DIAGNOSIS — F339 Major depressive disorder, recurrent, unspecified: Secondary | ICD-10-CM | POA: Diagnosis not present

## 2020-11-17 DIAGNOSIS — F339 Major depressive disorder, recurrent, unspecified: Secondary | ICD-10-CM | POA: Diagnosis not present

## 2020-11-26 DIAGNOSIS — F411 Generalized anxiety disorder: Secondary | ICD-10-CM | POA: Diagnosis not present

## 2021-01-06 DIAGNOSIS — F3181 Bipolar II disorder: Secondary | ICD-10-CM | POA: Diagnosis not present

## 2021-01-06 DIAGNOSIS — F5082 Avoidant/restrictive food intake disorder: Secondary | ICD-10-CM | POA: Diagnosis not present

## 2021-01-06 DIAGNOSIS — F411 Generalized anxiety disorder: Secondary | ICD-10-CM | POA: Diagnosis not present

## 2021-01-06 DIAGNOSIS — R41840 Attention and concentration deficit: Secondary | ICD-10-CM | POA: Diagnosis not present

## 2021-01-06 DIAGNOSIS — F5104 Psychophysiologic insomnia: Secondary | ICD-10-CM | POA: Diagnosis not present

## 2021-02-22 DIAGNOSIS — F411 Generalized anxiety disorder: Secondary | ICD-10-CM | POA: Diagnosis not present

## 2021-03-13 DIAGNOSIS — S93421A Sprain of deltoid ligament of right ankle, initial encounter: Secondary | ICD-10-CM | POA: Diagnosis not present

## 2021-03-30 DIAGNOSIS — R41840 Attention and concentration deficit: Secondary | ICD-10-CM | POA: Diagnosis not present

## 2021-03-30 DIAGNOSIS — F3181 Bipolar II disorder: Secondary | ICD-10-CM | POA: Diagnosis not present

## 2021-03-30 DIAGNOSIS — F5082 Avoidant/restrictive food intake disorder: Secondary | ICD-10-CM | POA: Diagnosis not present

## 2021-04-28 DIAGNOSIS — F411 Generalized anxiety disorder: Secondary | ICD-10-CM | POA: Diagnosis not present

## 2021-06-22 DIAGNOSIS — F5082 Avoidant/restrictive food intake disorder: Secondary | ICD-10-CM | POA: Diagnosis not present

## 2021-06-22 DIAGNOSIS — R41840 Attention and concentration deficit: Secondary | ICD-10-CM | POA: Diagnosis not present

## 2021-06-22 DIAGNOSIS — F5104 Psychophysiologic insomnia: Secondary | ICD-10-CM | POA: Diagnosis not present

## 2021-06-22 DIAGNOSIS — F3181 Bipolar II disorder: Secondary | ICD-10-CM | POA: Diagnosis not present

## 2021-07-17 DIAGNOSIS — F5104 Psychophysiologic insomnia: Secondary | ICD-10-CM | POA: Diagnosis not present

## 2021-07-17 DIAGNOSIS — F3181 Bipolar II disorder: Secondary | ICD-10-CM | POA: Diagnosis not present

## 2021-07-17 DIAGNOSIS — R41840 Attention and concentration deficit: Secondary | ICD-10-CM | POA: Diagnosis not present

## 2021-07-17 DIAGNOSIS — F5082 Avoidant/restrictive food intake disorder: Secondary | ICD-10-CM | POA: Diagnosis not present

## 2021-12-07 DIAGNOSIS — F3181 Bipolar II disorder: Secondary | ICD-10-CM | POA: Diagnosis not present

## 2021-12-07 DIAGNOSIS — F5082 Avoidant/restrictive food intake disorder: Secondary | ICD-10-CM | POA: Diagnosis not present

## 2021-12-07 DIAGNOSIS — R41840 Attention and concentration deficit: Secondary | ICD-10-CM | POA: Diagnosis not present

## 2022-02-19 DIAGNOSIS — F5082 Avoidant/restrictive food intake disorder: Secondary | ICD-10-CM | POA: Diagnosis not present

## 2022-02-19 DIAGNOSIS — R41840 Attention and concentration deficit: Secondary | ICD-10-CM | POA: Diagnosis not present

## 2022-02-19 DIAGNOSIS — F3181 Bipolar II disorder: Secondary | ICD-10-CM | POA: Diagnosis not present

## 2022-03-08 DIAGNOSIS — F5104 Psychophysiologic insomnia: Secondary | ICD-10-CM | POA: Diagnosis not present

## 2022-03-08 DIAGNOSIS — R41840 Attention and concentration deficit: Secondary | ICD-10-CM | POA: Diagnosis not present

## 2022-03-08 DIAGNOSIS — F5082 Avoidant/restrictive food intake disorder: Secondary | ICD-10-CM | POA: Diagnosis not present

## 2022-03-08 DIAGNOSIS — F3181 Bipolar II disorder: Secondary | ICD-10-CM | POA: Diagnosis not present

## 2022-03-12 ENCOUNTER — Other Ambulatory Visit: Payer: Self-pay | Admitting: Nurse Practitioner

## 2022-03-12 DIAGNOSIS — Z1231 Encounter for screening mammogram for malignant neoplasm of breast: Secondary | ICD-10-CM

## 2022-05-10 ENCOUNTER — Encounter: Payer: Self-pay | Admitting: Nurse Practitioner

## 2022-05-10 ENCOUNTER — Ambulatory Visit (INDEPENDENT_AMBULATORY_CARE_PROVIDER_SITE_OTHER): Payer: PPO | Admitting: Nurse Practitioner

## 2022-05-10 VITALS — BP 112/70 | Ht 65.5 in | Wt 105.0 lb

## 2022-05-10 DIAGNOSIS — Z780 Asymptomatic menopausal state: Secondary | ICD-10-CM

## 2022-05-10 DIAGNOSIS — Z681 Body mass index (BMI) 19 or less, adult: Secondary | ICD-10-CM | POA: Diagnosis not present

## 2022-05-10 DIAGNOSIS — Z9189 Other specified personal risk factors, not elsewhere classified: Secondary | ICD-10-CM | POA: Diagnosis not present

## 2022-05-10 DIAGNOSIS — Z01419 Encounter for gynecological examination (general) (routine) without abnormal findings: Secondary | ICD-10-CM

## 2022-05-10 DIAGNOSIS — M8589 Other specified disorders of bone density and structure, multiple sites: Secondary | ICD-10-CM

## 2022-05-10 NOTE — Progress Notes (Signed)
? ?Anne Little [birth year] [id number] ? ? ?History:  61 y.o. [id number] presents for breast and pelvic exam. Postmenopausal - no HRT, no bleeding. Normal pap history. Hypothyroidism managed by endocrinology, HLD managed by PCP, bipolar disorder, eating disorder, insomnia, ADD managed by psychiatry, kidney disease managed by nephrology. She reports weight loss due to recent GI problem, had diarrhea for 3 months. Doing better now but having trouble gaining weight back.  ? ?Gynecologic History ?No LMP recorded. Patient is postmenopausal. ?  ?Contraception: post menopausal status ?Sexually active: No ? ?Health Maintenance ?Last Pap: 04/08/2020. Results were: Normal, 3-year repeat ?Last mammogram: 05/05/2020. Results were: Normal. Scheduled 5/23 ?Last colonoscopy: 04/05/2017. Results were: Normal, 10-year recall ?Last Dexa: 06/26/2019. Results were: T-score -1.6, FRAX 6.3% / 0.6% ? ?Past medical history, past surgical history, family history and social history were all reviewed and documented in the EPIC chart. Single. Living in [place], son lives there. Has 2 sons, both engaged.  ? ?ROS:  A ROS was performed and pertinent positives and negatives are included. ? ?Exam: ? ?Vitals:  ? 05/10/22 1114  ?BP: 112/70  ?Weight: 105 lb (47.6 kg)  ?Height: 5' 5.5" (1.664 m)  ? ?Body mass index is 17.21 kg/m?. ? ?General appearance:  Normal, underweight ?Thyroid:  Symmetrical, normal in size, without palpable masses or nodularity. ?Respiratory ? Auscultation:  Clear without wheezing or rhonchi ?Cardiovascular ? Auscultation:  Regular rate, without rubs, murmurs or gallops ? Edema/varicosities:  Not grossly evident ?Abdominal ? Soft,nontender, without masses, guarding or rebound. ? Liver/spleen:  No organomegaly noted ? Hernia:  None appreciated ? Skin ? Inspection:  Grossly normal ?Breasts: Examined lying and sitting.  ? Right: Without masses, retractions, nipple discharge or axillary adenopathy. ? ? Left: Without masses, retractions,  nipple discharge or axillary adenopathy. ?Genitourinary  ? Inguinal/mons:  Normal without inguinal adenopathy ? External genitalia:  Normal appearing vulva with no masses, tenderness, or lesions ? BUS/Urethra/Skene's glands:  Normal ? Vagina:  Normal appearing with normal color and discharge, no lesions ? Cervix:  Normal appearing without discharge or lesions ? Uterus:  Normal in size, shape and contour.  Midline and mobile, nontender ? Adnexa/parametria:   ?  Rt: Normal in size, without masses or tenderness. ?  Lt: Normal in size, without masses or tenderness. ? Anus and perineum: Normal ? Digital rectal exam: Normal sphincter tone without palpated masses or tenderness ? ?Patient informed chaperone available to be present for breast and pelvic exam. Patient has requested no chaperone to be present. Patient has been advised what will be completed during breast and pelvic exam.  ? ?Assessment/Plan:  61 y.o. [id number] for breast and pelvic exam.  ? ?Well female exam with routine gynecological exam - Education provided on SBEs, importance of preventative screenings, current guidelines, high calcium diet, regular exercise, and multivitamin daily. Labs with PCP.  ? ?Postmenopausal - Plan: DG Bone Density. No HRT, no bleeding.  ? ?Osteopenia of multiple sites - Plan: DG Bone Density. Has appt scheduled tomorrow. T-score -1.6 without elevated FRAX in 2020. Recommend regular exercise. She does not take Vitamin D and calcium due to kidney disease. ? ?BMI less than 19,adult - recommend weight training and discuss safe diet with nephrology since she should limit protein intake due to kidney disease. Recommend increasing carb and calorie intake.  ? ?Screening for cervical cancer - Normal Pap history.  Will repeat at 3-year interval per guidelines. ? ?Screening for breast cancer - Normal mammogram history.  Continue annual screenings. Scheduled 05/18/2022. Normal breast  exam today. ? ?Screening for colon cancer - 2018 colonoscopy.  Will repeat at 10-year interval per GI's recommendation.  ? ?Return in 1 year for breast and pelvic exam.  ? ? ? ?Tamela Gammon DNP, 12:38 PM 05/10/2022 ? ?

## 2022-05-11 ENCOUNTER — Other Ambulatory Visit: Payer: Self-pay | Admitting: Nurse Practitioner

## 2022-05-11 ENCOUNTER — Ambulatory Visit (INDEPENDENT_AMBULATORY_CARE_PROVIDER_SITE_OTHER): Payer: PPO

## 2022-05-11 DIAGNOSIS — Z13820 Encounter for screening for osteoporosis: Secondary | ICD-10-CM | POA: Diagnosis not present

## 2022-05-11 DIAGNOSIS — M8589 Other specified disorders of bone density and structure, multiple sites: Secondary | ICD-10-CM

## 2022-05-11 DIAGNOSIS — Z780 Asymptomatic menopausal state: Secondary | ICD-10-CM

## 2022-05-11 DIAGNOSIS — E213 Hyperparathyroidism, unspecified: Secondary | ICD-10-CM | POA: Diagnosis not present

## 2022-05-11 DIAGNOSIS — E559 Vitamin D deficiency, unspecified: Secondary | ICD-10-CM | POA: Diagnosis not present

## 2022-05-11 DIAGNOSIS — I10 Essential (primary) hypertension: Secondary | ICD-10-CM | POA: Diagnosis not present

## 2022-05-11 DIAGNOSIS — M899 Disorder of bone, unspecified: Secondary | ICD-10-CM | POA: Diagnosis not present

## 2022-05-11 DIAGNOSIS — E039 Hypothyroidism, unspecified: Secondary | ICD-10-CM | POA: Diagnosis not present

## 2022-05-12 DIAGNOSIS — I129 Hypertensive chronic kidney disease with stage 1 through stage 4 chronic kidney disease, or unspecified chronic kidney disease: Secondary | ICD-10-CM | POA: Diagnosis not present

## 2022-05-12 DIAGNOSIS — N183 Chronic kidney disease, stage 3 unspecified: Secondary | ICD-10-CM | POA: Diagnosis not present

## 2022-05-12 DIAGNOSIS — N189 Chronic kidney disease, unspecified: Secondary | ICD-10-CM | POA: Diagnosis not present

## 2022-05-13 DIAGNOSIS — H524 Presbyopia: Secondary | ICD-10-CM | POA: Diagnosis not present

## 2022-05-14 DIAGNOSIS — E039 Hypothyroidism, unspecified: Secondary | ICD-10-CM | POA: Diagnosis not present

## 2022-05-14 DIAGNOSIS — R41840 Attention and concentration deficit: Secondary | ICD-10-CM | POA: Diagnosis not present

## 2022-05-14 DIAGNOSIS — F3181 Bipolar II disorder: Secondary | ICD-10-CM | POA: Diagnosis not present

## 2022-05-14 DIAGNOSIS — F5104 Psychophysiologic insomnia: Secondary | ICD-10-CM | POA: Diagnosis not present

## 2022-05-14 DIAGNOSIS — F5082 Avoidant/restrictive food intake disorder: Secondary | ICD-10-CM | POA: Diagnosis not present

## 2022-05-17 DIAGNOSIS — D631 Anemia in chronic kidney disease: Secondary | ICD-10-CM | POA: Diagnosis not present

## 2022-05-17 DIAGNOSIS — N2581 Secondary hyperparathyroidism of renal origin: Secondary | ICD-10-CM | POA: Diagnosis not present

## 2022-05-17 DIAGNOSIS — N1831 Chronic kidney disease, stage 3a: Secondary | ICD-10-CM | POA: Diagnosis not present

## 2022-05-17 DIAGNOSIS — E46 Unspecified protein-calorie malnutrition: Secondary | ICD-10-CM | POA: Diagnosis not present

## 2022-05-17 DIAGNOSIS — I129 Hypertensive chronic kidney disease with stage 1 through stage 4 chronic kidney disease, or unspecified chronic kidney disease: Secondary | ICD-10-CM | POA: Diagnosis not present

## 2022-05-18 ENCOUNTER — Ambulatory Visit
Admission: RE | Admit: 2022-05-18 | Discharge: 2022-05-18 | Disposition: A | Discharge status: Home / Self Care | Payer: PPO | Source: Ambulatory Visit | Attending: Nurse Practitioner | Admitting: Nurse Practitioner

## 2022-05-18 ENCOUNTER — Ambulatory Visit: Payer: PPO

## 2022-05-18 DIAGNOSIS — Z1231 Encounter for screening mammogram for malignant neoplasm of breast: Secondary | ICD-10-CM | POA: Diagnosis not present

## 2022-05-19 DIAGNOSIS — Z0000 Encounter for general adult medical examination without abnormal findings: Secondary | ICD-10-CM | POA: Diagnosis not present

## 2022-05-19 DIAGNOSIS — I130 Hypertensive heart and chronic kidney disease with heart failure and stage 1 through stage 4 chronic kidney disease, or unspecified chronic kidney disease: Secondary | ICD-10-CM | POA: Diagnosis not present

## 2022-05-19 DIAGNOSIS — F509 Eating disorder, unspecified: Secondary | ICD-10-CM | POA: Diagnosis not present

## 2022-05-19 DIAGNOSIS — F319 Bipolar disorder, unspecified: Secondary | ICD-10-CM | POA: Diagnosis not present

## 2022-05-19 DIAGNOSIS — R202 Paresthesia of skin: Secondary | ICD-10-CM | POA: Diagnosis not present

## 2022-05-19 DIAGNOSIS — M2550 Pain in unspecified joint: Secondary | ICD-10-CM | POA: Diagnosis not present

## 2022-05-19 DIAGNOSIS — E039 Hypothyroidism, unspecified: Secondary | ICD-10-CM | POA: Diagnosis not present

## 2022-05-19 DIAGNOSIS — N1831 Chronic kidney disease, stage 3a: Secondary | ICD-10-CM | POA: Diagnosis not present

## 2022-05-21 ENCOUNTER — Ambulatory Visit (INDEPENDENT_AMBULATORY_CARE_PROVIDER_SITE_OTHER): Payer: PPO | Admitting: Gastroenterology

## 2022-05-21 ENCOUNTER — Encounter: Payer: Self-pay | Admitting: Gastroenterology

## 2022-05-21 VITALS — BP 110/70 | HR 52 | Ht 66.0 in | Wt 109.6 lb

## 2022-05-21 DIAGNOSIS — Z79899 Other long term (current) drug therapy: Secondary | ICD-10-CM

## 2022-05-21 DIAGNOSIS — R194 Change in bowel habit: Secondary | ICD-10-CM

## 2022-05-21 DIAGNOSIS — K219 Gastro-esophageal reflux disease without esophagitis: Secondary | ICD-10-CM | POA: Diagnosis not present

## 2022-05-21 MED ORDER — FAMOTIDINE 20 MG PO TABS: 20.0000 mg | ORAL_TABLET | Freq: Every day | ORAL | 5 refills | Status: AC

## 2022-05-21 MED ORDER — CITRUCEL PO POWD: 1.0000 | Freq: Every day | ORAL | Status: DC

## 2022-05-21 NOTE — Patient Instructions (Signed)
If you are age 63 or older, your body mass index should be between 23-30. Your Body mass index is 17.69 kg/m. If this is out of the aforementioned range listed, please consider follow up with your Primary Care Provider.  If you are age 54 or younger, your body mass index should be between 19-25. Your Body mass index is 17.69 kg/m. If this is out of the aformentioned range listed, please consider follow up with your Primary Care Provider.   ________________________________________________________  The [facility] providers would like to encourage you to use [facility] to communicate with providers for non-urgent requests or questions.  Due to long hold times on the telephone, sending your provider a message by [facility] may be a faster and more efficient way to get a response.  Please allow 48 business hours for a response.  Please remember that this is for non-urgent requests.  _______________________________________________________  Stop Nexium  We have sent the following medications to your pharmacy for you to pick up at your convenience: Pepcid 20 mg : Take once daily  Please purchase the following medications over the counter and take as directed: Citrucel: Take once daily  Thank you for entrusting me with your care and for choosing [facility], Dr.  Cellar

## 2022-05-21 NOTE — Progress Notes (Signed)
HPI :  61 year old female known to me for history of constipation and suspected IBS, here for a follow-up visit for some altered bowel habits.  She was last seen in March 2021.  She has been living in [place] since have last seen her.  She states she had her typical pattern of altered bowels with sometimes constipated, sometimes with loose stools, but had an acute diarrheal illness sounds like end of January early February.  She states she had 1 day of high fever to 104, with urgent loose stools and stomach cramps.  She was seen by a physician in [place] who gave her " multiple shots of antibiotics" without doing any testing that she is aware of.  She was seen for second opinion by different physician who performed stool studies that were negative.  She has had loose stools ever since this febrile diarrheal illness, however things seem to have been getting better in recent weeks.  Her frequency is 1 stool per day that usually loose or soft.  Form seems to be improving lately.  She previously had significant bloating as she was trying to eat a lot of fresh fruits and vegetables in [place], she states since cutting back on that that has improved.  Her last colonoscopy was in 2018 and was normal.  She is followed by nephrology for CKD.  On review of labs from the past 2 weeks she had a BUN of 16 and creatinine of 1, normal iron studies and TSH.  CBC drawn showed a hemoglobin of 12.4, MCV of 91, WBC of 5.0.  She inquires about her long-term Nexium use and if she needs to take this anymore.  She has been taking this for history of heartburn/reflux.  It works really well for her's symptoms but wants to discuss other options.  She had an EGD in the past which showed no Barrett's esophagus.  Prior work-up: Colonoscopy 04/05/2017 - The perianal and digital rectal examinations were normal. - The terminal ileum appeared normal. - Internal hemorrhoids were found during retroflexion. The hemorrhoids were small. -  The exam was otherwise without abnormality.   Repeat exam in 10 years   EGD 06/15/2016 -  - The exam of the esophagus was otherwise normal. - The exam of the stomach was otherwise normal. No inflammatory changes or ulcerations were noted. - Biopsies were taken with a cold forceps in the gastric body and in the gastric antrum for Helicobacter pylori testing. - The duodenal bulb and second portion of the duodenum were normal. Biopsies for histology were taken with a cold forceps for evaluation of celiac disease.     Past Medical History:  Diagnosis Date   Allergy    Anemia    Anxiety    Asthma    with seasonal allergies at times- mild   Bipolar 1 disorder (HCC)    Chronic post-traumatic stress disorder (PTSD) 02/11/2015   Colon polyps    Constipation    Depression    Endometriosis    GERD (gastroesophageal reflux disease)    Hemorrhoids    internal   Hepatitis    Hx of adenomatous colonic polyps    Hyperlipidemia    Hypothyroidism    IBS (irritable bowel syndrome)    Kidney disease    Kidney stones    Osteopenia 05/2019   T score -1.6 FRAX 6% / 0.6% stable from prior DEXA   Parathyroid disease (HCC)    PTSD (post-traumatic stress disorder)    RLS (restless legs syndrome)  02/11/2015   Thyroid disease      Past Surgical History:  Procedure Laterality Date   APPENDECTOMY     COLONOSCOPY  2013   [facility]   ECTOPIC PREGNANCY SURGERY     OTHER SURGICAL HISTORY  1980   endometriosis/ectopic pregnancy   POLYPECTOMY     Family History  Problem Relation Age of Onset   Hyperlipidemia Mother    Bladder Cancer Father    Hyperlipidemia Father    Hypertension Father    Hyperlipidemia Sister    Hyperlipidemia Maternal Grandmother    Hypertension Maternal Grandmother    Stroke Maternal Grandmother    Mental illness Maternal Grandfather    Alcoholism Maternal Grandfather    Heart disease Paternal Grandmother    Hyperlipidemia Paternal Grandmother    Hypertension Paternal  Grandmother    Stroke Paternal Grandmother    Cancer Paternal Grandfather        Colon? vs stomach ?   Colon cancer Maternal Aunt    Ovarian cancer Maternal Aunt        Lynch syndrome   Colon cancer Other    Colon cancer Cousin    Breast cancer Cousin    Prostate cancer Cousin    Colon polyps Neg Hx    Rectal cancer Neg Hx    Stomach cancer Neg Hx    Social History   Tobacco Use   Smoking status: Former    Types: Cigarettes    Quit date: 12/27/1986    Years since quitting: 35.4   Smokeless tobacco: Never   Tobacco comments:    quit smoking 26 years ago  Vaping Use   Vaping Use: Never used  Substance Use Topics   Alcohol use: Yes    Alcohol/week: 0.0 standard drinks    Comment: Rare   Drug use: No    Comment: quit 1980   Current Outpatient Medications  Medication Sig Dispense Refill   Esomeprazole Magnesium (NEXIUM 24HR) 20 MG TBEC Take 1 tablet by mouth daily. 30 tablet 11   gabapentin (NEURONTIN) 300 MG capsule Take 3 capsules (900 mg total) by mouth at bedtime. agitation (Patient taking differently: Take 600 mg by mouth at bedtime. agitation) 90 capsule 0   propranolol (INDERAL) 10 MG tablet Take 10 mg by mouth daily.     SYNTHROID 25 MCG tablet Take 25 mcg by mouth every morning.     Probiotic Product (PROBIOTIC PO) Take by mouth. (Patient not taking: Reported on 05/21/2022)     No current facility-administered medications for this visit.   Allergies  Allergen Reactions   Benzodiazepines Other (See Comments)    paradoxical effect    Erythromycin Other (See Comments)    vomiting   Macrolides And Ketolides Other (See Comments)   Neomycin Swelling    "topical mycins"   Penicillins Hives    Pt unsure if allergy, due to having seen an horrific sight and hives could be anxiety related.     Trazodone And Nefazodone     Per pt she experiences a "paradoxical" reaction and shakiness with Trazodone in the past.    Lamictal [Lamotrigine] Rash     Review of  Systems: All systems reviewed and negative except where noted in HPI.   Labs per HPI  Physical Exam: BP 110/70   Pulse (!) 52   Ht '5\' 6"'$  (1.676 m)   Wt 109 lb 9.6 oz (49.7 kg)   BMI 17.69 kg/m  Constitutional: Pleasant,well-developed, female in no acute distress. Neurological: Alert and oriented  to person place and time. Psychiatric: Normal mood and affect. Behavior is normal.   ASSESSMENT AND PLAN: 61 year old female here for reassessment of the following:  Change of bowel habits GERD Long-term PPI use  I suspect she likely had infectious gastroenteritis based on fever and new onset diarrhea at the time.  Her stool testing was done after received multiple doses of antibiotics, unclear what caused this.  We discussed she more than likely has had a postinfectious IBS/functional bowel change after this infection.  No more constipation, usually 1 loose stool per day but form appears to be improving with some time.  Her labs are normal and reassuring.  Hopefully with more time this resolves.  I think taking a daily fiber supplement such as Citrucel once daily will help provide some regularity.  If not and her symptoms persist or worsen with time she will let me know.  Otherwise we reviewed her history of GERD, chronic Nexium use.  She does have some mild CKD, discussed long-term risks of chronic PPI use and would recommend using an alternative if she tolerates it.  We will try switching to Pepcid 20 mg daily and stop the Nexium and see how she does.  If her symptoms recur and bother her, may need to use low-dose Nexium as needed to control it.  Hopefully Pepcid is strong enough to work for her.  She will let me know if not.  Plan: - as above, suspect post-infectious functional change in bowels, with more time this should improve - start Citrucel once daily   - stop nexium in light of CKD - start Pepcid '20mg'$  / day  - follow up as needed or call with questions  Jolly Mango,  MD [facility]

## 2022-05-26 DIAGNOSIS — L738 Other specified follicular disorders: Secondary | ICD-10-CM | POA: Diagnosis not present

## 2022-05-26 DIAGNOSIS — L570 Actinic keratosis: Secondary | ICD-10-CM | POA: Diagnosis not present

## 2022-05-26 DIAGNOSIS — L821 Other seborrheic keratosis: Secondary | ICD-10-CM | POA: Diagnosis not present

## 2022-05-26 DIAGNOSIS — D1801 Hemangioma of skin and subcutaneous tissue: Secondary | ICD-10-CM | POA: Diagnosis not present

## 2023-01-07 DIAGNOSIS — F3181 Bipolar II disorder: Secondary | ICD-10-CM | POA: Diagnosis not present

## 2023-01-07 DIAGNOSIS — F5082 Avoidant/restrictive food intake disorder: Secondary | ICD-10-CM | POA: Diagnosis not present

## 2023-01-07 DIAGNOSIS — F4312 Post-traumatic stress disorder, chronic: Secondary | ICD-10-CM | POA: Diagnosis not present

## 2023-01-07 DIAGNOSIS — R41840 Attention and concentration deficit: Secondary | ICD-10-CM | POA: Diagnosis not present

## 2023-08-22 DIAGNOSIS — M899 Disorder of bone, unspecified: Secondary | ICD-10-CM | POA: Diagnosis not present

## 2023-08-22 DIAGNOSIS — E213 Hyperparathyroidism, unspecified: Secondary | ICD-10-CM | POA: Diagnosis not present

## 2023-08-22 DIAGNOSIS — E559 Vitamin D deficiency, unspecified: Secondary | ICD-10-CM | POA: Diagnosis not present

## 2023-08-22 DIAGNOSIS — E039 Hypothyroidism, unspecified: Secondary | ICD-10-CM | POA: Diagnosis not present

## 2023-08-22 DIAGNOSIS — M25552 Pain in left hip: Secondary | ICD-10-CM | POA: Diagnosis not present

## 2023-08-23 DIAGNOSIS — F5082 Avoidant/restrictive food intake disorder: Secondary | ICD-10-CM | POA: Diagnosis not present

## 2023-08-23 DIAGNOSIS — R41840 Attention and concentration deficit: Secondary | ICD-10-CM | POA: Diagnosis not present

## 2023-08-23 DIAGNOSIS — F3181 Bipolar II disorder: Secondary | ICD-10-CM | POA: Diagnosis not present

## 2023-08-26 DIAGNOSIS — D1801 Hemangioma of skin and subcutaneous tissue: Secondary | ICD-10-CM | POA: Diagnosis not present

## 2023-08-26 DIAGNOSIS — L821 Other seborrheic keratosis: Secondary | ICD-10-CM | POA: Diagnosis not present

## 2023-08-30 DIAGNOSIS — I10 Essential (primary) hypertension: Secondary | ICD-10-CM | POA: Diagnosis not present

## 2023-08-30 DIAGNOSIS — E039 Hypothyroidism, unspecified: Secondary | ICD-10-CM | POA: Diagnosis not present

## 2023-08-30 DIAGNOSIS — E559 Vitamin D deficiency, unspecified: Secondary | ICD-10-CM | POA: Diagnosis not present

## 2023-08-30 DIAGNOSIS — E213 Hyperparathyroidism, unspecified: Secondary | ICD-10-CM | POA: Diagnosis not present

## 2023-08-30 DIAGNOSIS — M899 Disorder of bone, unspecified: Secondary | ICD-10-CM | POA: Diagnosis not present

## 2024-05-24 ENCOUNTER — Other Ambulatory Visit: Payer: Self-pay

## 2024-05-24 DIAGNOSIS — E2839 Other primary ovarian failure: Secondary | ICD-10-CM

## 2024-05-25 ENCOUNTER — Other Ambulatory Visit: Payer: Self-pay | Admitting: Nurse Practitioner

## 2024-05-25 DIAGNOSIS — Z1231 Encounter for screening mammogram for malignant neoplasm of breast: Secondary | ICD-10-CM

## 2024-06-18 ENCOUNTER — Ambulatory Visit
Admission: RE | Admit: 2024-06-18 | Discharge: 2024-06-18 | Discharge status: Home / Self Care | Source: Ambulatory Visit | Attending: Nurse Practitioner

## 2024-06-18 ENCOUNTER — Ambulatory Visit (HOSPITAL_BASED_OUTPATIENT_CLINIC_OR_DEPARTMENT_OTHER)
Admission: RE | Admit: 2024-06-18 | Discharge: 2024-06-18 | Disposition: A | Discharge status: Home / Self Care | Source: Ambulatory Visit | Attending: Nurse Practitioner | Admitting: Nurse Practitioner

## 2024-06-18 DIAGNOSIS — Z1231 Encounter for screening mammogram for malignant neoplasm of breast: Secondary | ICD-10-CM

## 2024-06-18 DIAGNOSIS — E2839 Other primary ovarian failure: Secondary | ICD-10-CM | POA: Insufficient documentation

## 2024-06-18 DIAGNOSIS — M85852 Other specified disorders of bone density and structure, left thigh: Secondary | ICD-10-CM | POA: Diagnosis not present

## 2024-06-18 DIAGNOSIS — Z780 Asymptomatic menopausal state: Secondary | ICD-10-CM | POA: Diagnosis not present

## 2024-06-18 DIAGNOSIS — M85851 Other specified disorders of bone density and structure, right thigh: Secondary | ICD-10-CM | POA: Diagnosis not present

## 2024-06-20 ENCOUNTER — Ambulatory Visit: Payer: Self-pay | Admitting: Radiology

## 2024-06-20 DIAGNOSIS — B029 Zoster without complications: Secondary | ICD-10-CM | POA: Diagnosis not present

## 2024-06-20 DIAGNOSIS — S40862A Insect bite (nonvenomous) of left upper arm, initial encounter: Secondary | ICD-10-CM | POA: Diagnosis not present

## 2024-06-25 ENCOUNTER — Ambulatory Visit (INDEPENDENT_AMBULATORY_CARE_PROVIDER_SITE_OTHER): Admitting: Nurse Practitioner

## 2024-06-25 ENCOUNTER — Encounter: Payer: Self-pay | Admitting: Nurse Practitioner

## 2024-06-25 ENCOUNTER — Other Ambulatory Visit (HOSPITAL_COMMUNITY)
Admission: RE | Admit: 2024-06-25 | Discharge: 2024-06-25 | Disposition: A | Discharge status: Home / Self Care | Source: Ambulatory Visit | Attending: Nurse Practitioner | Admitting: Nurse Practitioner

## 2024-06-25 VITALS — BP 122/78 | HR 65 | Ht 65.75 in | Wt 120.0 lb

## 2024-06-25 DIAGNOSIS — Z1151 Encounter for screening for human papillomavirus (HPV): Secondary | ICD-10-CM | POA: Diagnosis not present

## 2024-06-25 DIAGNOSIS — Z124 Encounter for screening for malignant neoplasm of cervix: Secondary | ICD-10-CM | POA: Diagnosis not present

## 2024-06-25 DIAGNOSIS — Z01419 Encounter for gynecological examination (general) (routine) without abnormal findings: Secondary | ICD-10-CM

## 2024-06-25 DIAGNOSIS — F3181 Bipolar II disorder: Secondary | ICD-10-CM | POA: Diagnosis not present

## 2024-06-25 DIAGNOSIS — R41840 Attention and concentration deficit: Secondary | ICD-10-CM | POA: Diagnosis not present

## 2024-06-25 DIAGNOSIS — Z9289 Personal history of other medical treatment: Secondary | ICD-10-CM

## 2024-06-25 DIAGNOSIS — M8589 Other specified disorders of bone density and structure, multiple sites: Secondary | ICD-10-CM | POA: Diagnosis not present

## 2024-06-25 DIAGNOSIS — Z9189 Other specified personal risk factors, not elsewhere classified: Secondary | ICD-10-CM | POA: Diagnosis not present

## 2024-06-25 DIAGNOSIS — F5082 Avoidant/restrictive food intake disorder: Secondary | ICD-10-CM | POA: Diagnosis not present

## 2024-06-25 DIAGNOSIS — B009 Herpesviral infection, unspecified: Secondary | ICD-10-CM

## 2024-06-25 DIAGNOSIS — Z780 Asymptomatic menopausal state: Secondary | ICD-10-CM

## 2024-06-25 NOTE — Progress Notes (Signed)
 Madhavi Hamblen 09-05-1961 [id number]   History:  63 y.o. [id number] presents for breast and pelvic exam. Postmenopausal - no HRT, no bleeding. Normal pap history. Hypothyroidism managed by endocrinology, HLD managed by PCP, bipolar disorder, eating disorder, insomnia, ADD managed by psychiatry, kidney disease managed by nephrology. Has not seen nephrology in a couple of years, plans to find one in [place]. 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   Gynecologic History No LMP recorded. Patient is postmenopausal.   Contraception: post menopausal status Sexually active: No  Health Maintenance Last Pap: 04/08/2020. Results were: Normal Last mammogram: 06/18/2024. Results were: Normal Last colonoscopy: 04/05/2017. Results were: Normal, 10-year recall Last Dexa: 06/18/2024. Results were: T-score -1.9, FRAX 9.4% / 1.8%  Past medical history, past surgical history, family history and social history were all reviewed and documented in the EPIC chart. Single. Living in [place]. Has 2 sons, one married, one engaged.   ROS:  A ROS was performed and pertinent positives and negatives are included.  Exam:  Vitals:   06/25/24 0732  BP: 122/78  Pulse: 65  SpO2: 98%  Weight: 120 lb (54.4 kg)  Height: 5' 5.75 (1.67 m)    Body mass index is 19.52 kg/m.  General appearance:  Normal, underweight Thyroid :  Symmetrical, normal in size, without palpable masses or nodularity. Respiratory  Auscultation:  Clear without wheezing or rhonchi Cardiovascular  Auscultation:  Regular rate, without rubs, murmurs or gallops  Edema/varicosities:  Not grossly evident Abdominal  Soft,nontender, without masses, guarding or rebound.  Liver/spleen:  No organomegaly noted  Hernia:  None appreciated  Skin  Inspection:  Grossly normal Breasts: Examined lying and sitting.   Right: Without masses, retractions, nipple discharge or axillary adenopathy.   Left: Without masses, retractions, nipple discharge or axillary  adenopathy. Pelvic: External genitalia:  no lesions              Urethra:  normal appearing urethra with no masses, tenderness or lesions              Bartholins and Skenes: normal                 Vagina: normal appearing vagina with normal color and discharge, no lesions              Cervix: no lesions Bimanual Exam:  Uterus:  no masses or tenderness              Adnexa: no mass, fullness, tenderness              Rectovaginal: Deferred              Anus:  normal, no lesions  Patient informed chaperone available to be present for breast and pelvic exam. Patient has requested no chaperone to be present. Patient has been advised what will be completed during breast and pelvic exam.   Assessment/Plan:  63 y.o. [id number] for breast and pelvic exam.   Encounter for breast and pelvic examination - Education provided on SBEs, importance of preventative screenings, current guidelines, high calcium diet, regular exercise, and multivitamin daily. Labs with PCP.   Postmenopausal - No HRT, no bleeding.   Osteopenia of multiple sites - 05/2024 T-score -1.9 without elevated FRAX. Has trainer, doing weight training 3 times per week.  Cervical cancer screening - Plan: Cytology - PAP( [facility]). Normal pap history.   Screening for breast cancer - Normal mammogram history.  Continue annual screenings. Normal breast exam today.  Screening for colon  cancer - 2018 colonoscopy. Will repeat at 10-year interval per GI's recommendation.   Return in about 1 year (around 06/25/2025) for B&P (high risk).    Annabella DELENA Shutter DNP, 8:15 AM 06/25/2024

## 2024-06-26 ENCOUNTER — Ambulatory Visit: Payer: Self-pay | Admitting: Nurse Practitioner

## 2024-06-26 LAB — CYTOLOGY - PAP
Comment: NEGATIVE
Diagnosis: NEGATIVE
High risk HPV: NEGATIVE
